# Patient Record
Sex: Female | Born: 1980 | Hispanic: Yes | Marital: Single | Smoking: Never smoker
Health system: Southern US, Community
[De-identification: ages and names within clinical notes are randomized; demographics above are authoritative.]

## PROBLEM LIST (undated history)

## (undated) DIAGNOSIS — N289 Disorder of kidney and ureter, unspecified: Secondary | ICD-10-CM

## (undated) DIAGNOSIS — N189 Chronic kidney disease, unspecified: Secondary | ICD-10-CM

## (undated) DIAGNOSIS — E669 Obesity, unspecified: Secondary | ICD-10-CM

## (undated) DIAGNOSIS — E785 Hyperlipidemia, unspecified: Secondary | ICD-10-CM

## (undated) HISTORY — PX: ABDOMINAL HYSTERECTOMY: SHX81

## (undated) HISTORY — DX: Disorder of kidney and ureter, unspecified: N28.9

## (undated) HISTORY — PX: TONSILLECTOMY: SUR1361

## (undated) HISTORY — DX: Chronic kidney disease, unspecified: N18.9

## (undated) HISTORY — DX: Hyperlipidemia, unspecified: E78.5

## (undated) HISTORY — PX: TUBAL LIGATION: SHX77

## (undated) HISTORY — PX: SPINE SURGERY: SHX786

## (undated) HISTORY — PX: APPENDECTOMY: SHX54

## (undated) HISTORY — DX: Obesity, unspecified: E66.9

---

## 2005-12-09 DIAGNOSIS — D689 Coagulation defect, unspecified: Secondary | ICD-10-CM | POA: Insufficient documentation

## 2005-12-09 DIAGNOSIS — R635 Abnormal weight gain: Secondary | ICD-10-CM | POA: Insufficient documentation

## 2012-02-01 HISTORY — PX: OTHER SURGICAL HISTORY: SHX169

## 2013-04-27 HISTORY — PX: OTHER SURGICAL HISTORY: SHX169

## 2015-01-02 HISTORY — PX: OTHER SURGICAL HISTORY: SHX169

## 2015-01-04 HISTORY — PX: OTHER SURGICAL HISTORY: SHX169

## 2016-05-04 ENCOUNTER — Encounter (HOSPITAL_COMMUNITY): Payer: Self-pay | Admitting: Emergency Medicine

## 2016-05-04 ENCOUNTER — Emergency Department (HOSPITAL_COMMUNITY)
Admission: EM | Admit: 2016-05-04 | Discharge: 2016-05-04 | Disposition: A | Discharge status: Home / Self Care | Payer: Medicaid Other | Attending: Emergency Medicine | Admitting: Emergency Medicine

## 2016-05-04 DIAGNOSIS — Z98818 Other dental procedure status: Secondary | ICD-10-CM | POA: Diagnosis not present

## 2016-05-04 DIAGNOSIS — R6884 Jaw pain: Secondary | ICD-10-CM | POA: Diagnosis not present

## 2016-05-04 MED ORDER — IBUPROFEN 800 MG PO TABS: 800.0000 mg | ORAL_TABLET | Freq: Three times a day (TID) | ORAL | 0 refills | Status: DC

## 2016-05-04 MED ORDER — CYCLOBENZAPRINE HCL 10 MG PO TABS: 10.0000 mg | ORAL_TABLET | Freq: Two times a day (BID) | ORAL | 0 refills | Status: DC | PRN

## 2016-05-04 NOTE — ED Provider Notes (Signed)
[facility] DEPT Provider Note   CSN: [id number] Arrival date & time: 05/04/16  3382  By signing my name below, I, Collene Leyden, attest that this documentation has been prepared under the direction and in the presence of Providence Lanius, [place] . Electronically Signed: Collene Leyden, Scribe. 05/04/16. 11:44 AM.  History   Chief Complaint Chief Complaint  Patient presents with  . Dental Pain   HPI Comments: Zoe Woodard is a 36 y.o. female with no apparent medical history, who presents to the Emergency Department with 1 week of constant dull pain that began after a dental procedure. Patient reports having 2 bilateral cavities filled one week ago and states since then she has had bilateral jaw pain. She states that pain radiates to bilateral ears and is worse with movement of her jaw. She has been able to open and close her mouth but reports some difficulty and associated pain with movement. She is taking Advil and aspirin orally for pain. She has not called her dentist yet and discussed the patient with him. Patient does state that her mouth was open for several hours during the cavity repairs. Patient denies any injury, fever, chills, facial swelling, sore throat, trouble swallowing, or any other symptoms.   The history is provided by the patient. No language interpreter was used.    History reviewed. No pertinent past medical history.  There are no active problems to display for this patient.   History reviewed. No pertinent surgical history.  OB History    No data available       Home Medications    Prior to Admission medications   Medication Sig Start Date End Date Taking? Authorizing Provider  cyclobenzaprine (FLEXERIL) 10 MG tablet Take 1 tablet (10 mg total) by mouth 2 (two) times daily as needed for muscle spasms. 05/04/16   Volanda Napoleon, PA-C  ibuprofen (ADVIL,MOTRIN) 800 MG tablet Take 1 tablet (800 mg total) by mouth 3 (three) times daily. 05/04/16   Volanda Napoleon, PA-C    Family History No family history on file.  Social History Social History  Substance Use Topics  . Smoking status: Not on file  . Smokeless tobacco: Not on file  . Alcohol use Not on file     Allergies   Patient has no known allergies.   Review of Systems Review of Systems  Constitutional: Negative for chills and fever.  HENT: Negative for ear pain, facial swelling, sore throat and trouble swallowing.        Jaw pain.   Respiratory: Negative for cough and shortness of breath.   Cardiovascular: Negative for chest pain.  Gastrointestinal: Negative for abdominal pain, diarrhea, nausea and vomiting.  Genitourinary: Negative for dysuria and hematuria.  Neurological: Negative for headaches.  All other systems reviewed and are negative.    Physical Exam Updated Vital Signs BP 132/76 (BP Location: Right Arm)   Pulse 77   Temp 98.8 F (37.1 C) (Oral)   Resp 16   Ht 5\' 3"  (1.6 m)   Wt 70.3 kg   SpO2 99%   BMI 27.46 kg/m   Physical Exam  Constitutional: She is oriented to person, place, and time. She appears well-developed and well-nourished.  HENT:  Head: Normocephalic and atraumatic.  Right Ear: Tympanic membrane normal.  Left Ear: Tympanic membrane normal.  Nose: Nose normal.  Mouth/Throat: Normal dentition.    No gingival swelling. No signs of dental abscess noted. Evidence of dental crowns noted on bilateral teeth. (see picture). Some  mild trismus (secondary to patients' report of pain) but good elevation and depression of mandible. No dislocation or deformity of jaw noted.   Cardiovascular: Normal rate.   Pulmonary/Chest: Effort normal.  Lymphadenopathy:    She has no cervical adenopathy.  Neurological: She is alert and oriented to person, place, and time.  Skin: Skin is warm and dry.  No erythema, warmth, or tenderness to the mandible or face.  Psychiatric: She has a normal mood and affect.  Nursing note and vitals reviewed.   ED  Treatments / Results  DIAGNOSTIC STUDIES: Oxygen Saturation is 100% on RA, normal by my interpretation.    COORDINATION OF CARE: 11:43 AM Discussed treatment plan with pt at bedside and pt agreed to plan, which includes a flexeril, pain medication, and warm compresses.   Labs (all labs ordered are listed, but only abnormal results are displayed) Labs Reviewed - No data to display  EKG  EKG Interpretation None       Radiology No results found.  Procedures Procedures (including critical care time)  Medications Ordered in ED Medications - No data to display   Initial Impression / Assessment and Plan / ED Course  I have reviewed the triage vital signs and the nursing notes.  Pertinent labs & imaging results that were available during my care of the patient were reviewed by me and considered in my medical decision making (see chart for details).     36 year old female who presents with 1 week of bilateral jaw pain that began after dental procedure. Patient states that she got 2 cavities filled at her dentist last week and the procedure took several hours to complete. She notes that she had her mouth open the entire time. No evidence of dental abscess or jaw dislocation on physical exam. No facial swelling or lymphadenopathy noted. Symptoms likely resolve muscular strain from prolonged procedure. Plan to treat with Flexeril and NSAIDs. Instructed her to use warm compresses. Advised patient to call her dentist to arrange for follow-up.  Instructed patient to follow-up with dentist in 24-48 hours. Return precautions discussed. Patient expresses understanding and agreement to plan.    Final Clinical Impressions(s) / ED Diagnoses   Final diagnoses:  Jaw pain    New Prescriptions Discharge Medication List as of 05/04/2016 12:04 PM    START taking these medications   Details  cyclobenzaprine (FLEXERIL) 10 MG tablet Take 1 tablet (10 mg total) by mouth 2 (two) times daily as needed  for muscle spasms., Starting Tue 05/04/2016, Print    ibuprofen (ADVIL,MOTRIN) 800 MG tablet Take 1 tablet (800 mg total) by mouth 3 (three) times daily., Starting Tue 05/04/2016, Print       I personally performed the services described in this documentation, which was scribed in my presence. The recorded information has been reviewed and is accurate.    Volanda Napoleon, PA-C 05/04/16 [place], MD 05/05/16 [phone]

## 2016-05-04 NOTE — ED Notes (Signed)
Pt states that she had 2 cavities filled last wed , one on each side of lower jaw , since then then she has had increasing pain, that radiates to her ears, feels like her face is swollen, states she did not want to go back to ger dentist  Because she felt he hurt her and did not do  Something right. She denies drinking from straw and /or smoking.

## 2016-05-04 NOTE — Discharge Instructions (Addendum)
Call your dentist today and arrange to be seen in the next 24-48 hours for re-evaluation. Tell him you were seen in the Emergency Department.   You can do the jaw exercises listed on the discharge instructions.   Apply warm compresses to the area.   Return to the Emergency Department for any worsening pain, fever, or other worrisome or concerning symptoms.

## 2016-05-04 NOTE — ED Triage Notes (Addendum)
Pt reports she has two cavities filled last Wednesday and since the numbing wore off pt has been experiencing bad in her jaws and into ear. Pt states she has not called dentist "because I didn't want them to do that to my mouth again". Pt has no difficulty swallowing or pain in throat.

## 2016-08-02 ENCOUNTER — Ambulatory Visit (INDEPENDENT_AMBULATORY_CARE_PROVIDER_SITE_OTHER): Payer: Medicaid Other | Admitting: Internal Medicine

## 2016-08-02 VITALS — BP 114/82 | HR 85 | Temp 99.0°F | Ht 63.0 in | Wt 155.6 lb

## 2016-08-02 DIAGNOSIS — Z7689 Persons encountering health services in other specified circumstances: Secondary | ICD-10-CM

## 2016-08-02 DIAGNOSIS — Z0000 Encounter for general adult medical examination without abnormal findings: Secondary | ICD-10-CM | POA: Diagnosis not present

## 2016-08-02 NOTE — Patient Instructions (Signed)
Ms. Tat,  It was nice to meet you today. Thank you for providing your health information.  I will see you back in 1 year or sooner for any concerns.  Best, Dr. Ola Spurr

## 2016-08-02 NOTE — Progress Notes (Signed)
[facility] Progress Note  Subjective:  Zoe Woodard is a 36 y.o. female who presents to establish care.  She has no specific concerns today. Had 2 cavities filled recently and still getting some food stuck.   PMH: - Fibroids - Not on any regular medications but takes a multivitamin and vitamin C supplement  Last Family Doctor in [facility] [place].  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 - Children are healthy.  Social: - Currently unemployed - Moved from [facility] to live with family in [place] due to financial constraints - Used to work in a distribution center - First language is Spanish but speaks English - Has 3 children --- Elmer (67), Jennifer (110), Emely (5) - Friend drove her to this appointment. - She is [place]. - Does zumba 3 days a week for exercise - Never smoked, does not use recreational drugs, no alcohol use - Likes to color for fun  No Known Allergies  Objective: Blood pressure 114/82, pulse 85, temperature 99 F (37.2 C), temperature source Oral, height 5\' 3"  (1.6 m), weight 155 lb 9.6 oz (70.6 kg), SpO2 97 %. Body mass index is 27.56 kg/m.  Constitutional: Overweight female in NAD HENT: MMM, normal posterior oropharynx Cardiovascular: RRR, S1, S2, no m/r/g.  Pulmonary/Chest: Effort normal and breath sounds normal. No respiratory distress.  Abdominal: Soft. +BS, NT, ND Musculoskeletal: No LE edema.  Neurological: AOx3, no focal deficits. Skin: Skin is warm and dry. No rash noted.  Psychiatric: Normal  mood and affect.  Vitals reviewed  Assessment/Plan: Healthcare maintenance - Healthy patient with no chronic medical problems, though has extensive surgical history. - Will request records from last PCP to determine if patient is due for any vaccinations/screening labs. - Counseled on healthy weight and encouraged continuing regular exercise and to look at meal portions.   Follow-up prn.  Olene Floss, MD [place], PGY-3

## 2016-08-04 ENCOUNTER — Encounter: Payer: Self-pay | Admitting: Internal Medicine

## 2016-08-04 DIAGNOSIS — Z0000 Encounter for general adult medical examination without abnormal findings: Secondary | ICD-10-CM | POA: Insufficient documentation

## 2016-08-04 NOTE — Assessment & Plan Note (Signed)
-   Healthy patient with no chronic medical problems, though has extensive surgical history. - Will request records from last PCP to determine if patient is due for any vaccinations/screening labs. - Counseled on healthy weight and encouraged continuing regular exercise and to look at meal portions.

## 2017-06-11 ENCOUNTER — Encounter

## 2017-10-06 ENCOUNTER — Ambulatory Visit (INDEPENDENT_AMBULATORY_CARE_PROVIDER_SITE_OTHER): Payer: Medicaid Other

## 2017-10-06 DIAGNOSIS — Z23 Encounter for immunization: Secondary | ICD-10-CM | POA: Diagnosis not present

## 2017-10-06 NOTE — Progress Notes (Signed)
Pt presents in nurse clinic for a flu vaccine. Injection give LD, site unremarkable. Epic and [facility] updated.

## 2018-01-18 ENCOUNTER — Encounter (HOSPITAL_COMMUNITY): Payer: Self-pay

## 2018-01-18 ENCOUNTER — Ambulatory Visit (HOSPITAL_COMMUNITY)
Admission: EM | Admit: 2018-01-18 | Discharge: 2018-01-18 | Disposition: A | Discharge status: Home / Self Care | Payer: Medicaid Other | Attending: Family Medicine | Admitting: Family Medicine

## 2018-01-18 DIAGNOSIS — R6889 Other general symptoms and signs: Secondary | ICD-10-CM | POA: Diagnosis not present

## 2018-01-18 MED ORDER — IBUPROFEN 400 MG PO TABS: 400.0000 mg | ORAL_TABLET | Freq: Four times a day (QID) | ORAL | 0 refills | Status: DC | PRN

## 2018-01-18 MED ORDER — BENZONATATE 100 MG PO CAPS: 100.0000 mg | ORAL_CAPSULE | Freq: Three times a day (TID) | ORAL | 0 refills | Status: DC

## 2018-01-18 MED ORDER — FLUTICASONE PROPIONATE 50 MCG/ACT NA SUSP: 2.0000 | Freq: Every day | NASAL | 0 refills | Status: DC

## 2018-01-18 MED ORDER — CETIRIZINE HCL 10 MG PO CHEW: 10.0000 mg | CHEWABLE_TABLET | Freq: Every day | ORAL | 0 refills | Status: DC

## 2018-01-18 NOTE — Discharge Instructions (Signed)
Get plenty of rest and push fluids Tessalon Perles prescribed for cough Zyrtec prescribed for nasal congestion, runny nose, and/or sore throat Flonase prescribed for nasal congestion and runny nose Use medications daily for symptom relief Ibuprofen prescribed.  Take as needed for fever and/or body aches and pain Follow up with PCP next week if symptoms persist Return or go to ER if you have any new or worsening symptoms fever, chills, nausea, vomiting, chest pain, cough, shortness of breath, wheezing, abdominal pain, changes in bowel or bladder habits, etc..Marland Kitchen

## 2018-01-18 NOTE — ED Provider Notes (Signed)
[facility]   [id number] 01/18/18 Arrival Time: 1761   CC: Flu-like illness  SUBJECTIVE: History from: patient.  Tianna Baus is a 38 y.o. female who presents with abrupt onset of nasal congestion, runny nose, sore throat, cough, body aches, and fatigue that began 3 days ago .  Admits to positive sick exposure.  Has tried OTC medications with temporary relief.  Denies aggravating factors.  Denies previous symptoms in the past.  Complains of subjective fever,   Denies SOB, wheezing, chest pain, nausea, changes in bowel or bladder habits.    Received flu shot this year: yes.  ROS: As per HPI.  History reviewed. No pertinent past medical history. Past Surgical History:  Procedure Laterality Date  . ABDOMINAL HYSTERECTOMY    . SPINE SURGERY    . TUBAL LIGATION     No Known Allergies No current facility-administered medications on file prior to encounter.    No current outpatient medications on file prior to encounter.   Social History   Socioeconomic History  . Marital status: Single    Spouse name: Not on file  . Number of children: Not on file  . Years of education: Not on file  . Highest education level: Not on file  Occupational History  . Not on file  Social Needs  . Financial resource strain: Not on file  . Food insecurity:    Worry: Not on file    Inability: Not on file  . Transportation needs:    Medical: Not on file    Non-medical: Not on file  Tobacco Use  . Smoking status: Never Smoker  . Smokeless tobacco: Never Used  Substance and Sexual Activity  . Alcohol use: No  . Drug use: No  . Sexual activity: Not on file  Lifestyle  . Physical activity:    Days per week: Not on file    Minutes per session: Not on file  . Stress: Not on file  Relationships  . Social connections:    Talks on phone: Not on file    Gets together: Not on file    Attends religious service: Not on file    Active member of club or organization: Not on file   Attends meetings of clubs or organizations: Not on file    Relationship status: Not on file  . Intimate partner violence:    Fear of current or ex partner: Not on file    Emotionally abused: Not on file    Physically abused: Not on file    Forced sexual activity: Not on file  Other Topics Concern  . Not on file  Social History Narrative  . Not on file   Family History  Problem Relation Age of Onset  . Healthy Mother   . Healthy Father     OBJECTIVE:  Vitals:   01/18/18 1013 01/18/18 1207  BP: (!) 154/108 (!) 157/106  Pulse: (!) 104   Resp: 19   Temp: 98.1 F (36.7 C)   SpO2: 100%      General appearance: alert; appears fatigued, but nontoxic; speaking in full sentences and tolerating own secretions HEENT: NCAT; Ears: EACs clear, TMs pearly gray; Eyes: PERRL.  EOM grossly intact. Sinuses: nontender; Nose: nares patent with mild rhinorrhea, Throat: oropharynx clear, tonsils non erythematous or enlarged, uvula midline; dry MM Neck: supple without LAD Lungs: unlabored respirations, symmetrical air entry; cough: absent; no respiratory distress; CTAB Heart: regular rate and rhythm.  Radial pulses 2+ symmetrical bilaterally; cap refill <2 secs  Skin: warm and dry; no tenting of dorsal aspect of hand Psychological: alert and cooperative; normal mood and affect  ASSESSMENT & PLAN:  1. Flu-like symptoms     Meds ordered this encounter  Medications  . ibuprofen (ADVIL,MOTRIN) 400 MG tablet    Sig: Take 1 tablet (400 mg total) by mouth every 6 (six) hours as needed.    Dispense:  30 tablet    Refill:  0    Order Specific Question:   Supervising Provider    Answer:   Raylene Everts [[id number]]  . fluticasone (FLONASE) 50 MCG/ACT nasal spray    Sig: Place 2 sprays into both nostrils daily.    Dispense:  16 g    Refill:  0    Order Specific Question:   Supervising Provider    Answer:   Raylene Everts [[id number]]  . cetirizine (ZYRTEC) 10 MG chewable tablet    Sig: Chew 1  tablet (10 mg total) by mouth daily.    Dispense:  20 tablet    Refill:  0    Order Specific Question:   Supervising Provider    Answer:   Raylene Everts [[id number]]  . benzonatate (TESSALON) 100 MG capsule    Sig: Take 1 capsule (100 mg total) by mouth every 8 (eight) hours.    Dispense:  21 capsule    Refill:  0    Order Specific Question:   Supervising Provider    Answer:   Raylene Everts [[id number]]    Get plenty of rest and push fluids Tessalon Perles prescribed for cough Zyrtec prescribed for nasal congestion, runny nose, and/or sore throat Flonase prescribed for nasal congestion and runny nose Use medications daily for symptom relief Ibuprofen prescribed.  Take as needed for fever and/or body aches and pain Follow up with PCP next week if symptoms persist Return or go to ER if you have any new or worsening symptoms fever, chills, nausea, vomiting, chest pain, cough, shortness of breath, wheezing, abdominal pain, changes in bowel or bladder habits, etc...  Blood pressure elevated in office.  Please recheck in 24 hours.  If it continues to be greater than 140/90 please follow up with PCP for further evaluation and management.    Reviewed expectations re: course of current medical issues. Questions answered. Outlined signs and symptoms indicating need for more acute intervention. Patient verbalized understanding. After Visit Summary given.         Lestine Box, PA-C 01/18/18 1226

## 2018-01-18 NOTE — ED Triage Notes (Signed)
Pt presents with complaints of fever, sore throat, stuff nose and headache x 3-4 days.

## 2018-01-19 DIAGNOSIS — B349 Viral infection, unspecified: Secondary | ICD-10-CM | POA: Diagnosis not present

## 2018-01-19 DIAGNOSIS — D72829 Elevated white blood cell count, unspecified: Secondary | ICD-10-CM | POA: Diagnosis not present

## 2018-01-19 DIAGNOSIS — R1084 Generalized abdominal pain: Secondary | ICD-10-CM | POA: Diagnosis not present

## 2018-01-19 DIAGNOSIS — R319 Hematuria, unspecified: Secondary | ICD-10-CM | POA: Diagnosis not present

## 2018-01-19 DIAGNOSIS — R509 Fever, unspecified: Secondary | ICD-10-CM | POA: Diagnosis not present

## 2018-01-19 DIAGNOSIS — R109 Unspecified abdominal pain: Secondary | ICD-10-CM | POA: Diagnosis not present

## 2018-01-19 DIAGNOSIS — K5730 Diverticulosis of large intestine without perforation or abscess without bleeding: Secondary | ICD-10-CM | POA: Diagnosis not present

## 2018-01-27 ENCOUNTER — Encounter: Payer: Self-pay | Admitting: Family Medicine

## 2018-01-27 ENCOUNTER — Other Ambulatory Visit: Payer: Self-pay

## 2018-01-27 ENCOUNTER — Ambulatory Visit: Payer: Medicaid Other | Admitting: Family Medicine

## 2018-01-27 VITALS — BP 140/92 | HR 94 | Temp 98.8°F | Ht 63.0 in | Wt 169.6 lb

## 2018-01-27 DIAGNOSIS — R030 Elevated blood-pressure reading, without diagnosis of hypertension: Secondary | ICD-10-CM

## 2018-01-27 DIAGNOSIS — E6609 Other obesity due to excess calories: Secondary | ICD-10-CM | POA: Diagnosis not present

## 2018-01-27 DIAGNOSIS — E669 Obesity, unspecified: Secondary | ICD-10-CM | POA: Insufficient documentation

## 2018-01-27 DIAGNOSIS — Z6830 Body mass index (BMI) 30.0-30.9, adult: Secondary | ICD-10-CM | POA: Diagnosis not present

## 2018-01-27 DIAGNOSIS — Z0000 Encounter for general adult medical examination without abnormal findings: Secondary | ICD-10-CM

## 2018-01-27 NOTE — Patient Instructions (Addendum)
It was wonderful to see you today.  Thank you for choosing [facility].   Please call [phone] with any questions about today's appointment.  Please be sure to schedule follow up at the front  desk before you leave today.   Dorris Singh, MD  Family Medicine    Monitor your blood pressure at home----twice a week  Reduce juice, soda, and very salty foods  Increase your activity level         Mediterranean Diet  Why follow it? Research shows. . Those who follow the Mediterranean diet have a reduced risk of heart disease  . The diet is associated with a reduced incidence of Parkinson's and Alzheimer's diseases . People following the diet may have longer life expectancies and lower rates of chronic diseases  . The Dietary Guidelines for Americans recommends the Mediterranean diet as an eating plan to promote health and prevent disease  What Is the Mediterranean Diet?  . Healthy eating plan based on typical foods and recipes of Mediterranean-style cooking . The diet is primarily a plant based diet; these foods should make up a majority of meals   Starches - Plant based foods should make up a majority of meals - They are an important sources of vitamins, minerals, energy, antioxidants, and fiber - Choose whole grains, foods high in fiber and minimally processed items  - Typical grain sources include wheat, oats, barley, corn, [place] rice, bulgar, farro, millet, polenta, couscous  - Various types of beans include chickpeas, lentils, fava beans, black beans, white beans   Fruits  Veggies - Large quantities of antioxidant rich fruits & veggies; 6 or more servings  - Vegetables can be eaten raw or lightly drizzled with oil and cooked  - Vegetables common to the traditional Mediterranean Diet include: artichokes, arugula, beets, broccoli, brussel sprouts, cabbage, carrots, celery, collard greens, cucumbers, eggplant, kale, leeks, lemons, lettuce, mushrooms, okra, onions,  peas, peppers, potatoes, pumpkin, radishes, rutabaga, shallots, spinach, sweet potatoes, turnips, zucchini - Fruits common to the Mediterranean Diet include: apples, apricots, avocados, cherries, clementines, dates, figs, grapefruits, grapes, melons, nectarines, oranges, peaches, pears, pomegranates, strawberries, tangerines  Fats - Replace butter and margarine with healthy oils, such as olive oil, canola oil, and tahini  - Limit nuts to no more than a handful a day  - Nuts include walnuts, almonds, pecans, pistachios, pine nuts  - Limit or avoid candied, honey roasted or heavily salted nuts - Olives are central to the [employer] - can be eaten whole or used in a variety of dishes   Meats Protein - Limiting red meat: no more than a few times a month - When eating red meat: choose lean cuts and keep the portion to the size of deck of cards - Eggs: approx. 0 to 4 times a week  - Fish and lean poultry: at least 2 a week  - Healthy protein sources include, chicken, [place], lean beef, lamb - Increase intake of seafood such as tuna, salmon, trout, mackerel, shrimp, scallops - Avoid or limit high fat processed meats such as sausage and bacon  Dairy - Include moderate amounts of low fat dairy products  - Focus on healthy dairy such as fat free yogurt, skim milk, low or reduced fat cheese - Limit dairy products higher in fat such as whole or 2% milk, cheese, ice cream  Alcohol - Moderate amounts of red wine is ok  - No more than 5 oz daily for women (all ages) and men  older than age 34  - No more than 10 oz of wine daily for men younger than 27  Other - Limit sweets and other desserts  - Use herbs and spices instead of salt to flavor foods  - Herbs and spices common to the traditional Mediterranean Diet include: basil, bay leaves, chives, cloves, cumin, fennel, garlic, lavender, marjoram, mint, oregano, parsley, pepper, rosemary, sage, savory, sumac, tarragon, thyme   It's not just a diet,  it's a lifestyle:  . The Mediterranean diet includes lifestyle factors typical of those in the region  . Foods, drinks and meals are best eaten with others and savored . Daily physical activity is important for overall good health . This could be strenuous exercise like running and aerobics . This could also be more leisurely activities such as walking, housework, yard-work, or taking the stairs . Moderation is the key; a balanced and healthy diet accommodates most foods and drinks . Consider portion sizes and frequency of consumption of certain foods   Meal Ideas & Options:  . Breakfast:  o Whole wheat toast or whole wheat English muffins with peanut butter & hard boiled egg o Steel cut oats topped with apples & cinnamon and skim milk  o Fresh fruit: banana, strawberries, melon, berries, peaches  o Smoothies: strawberries, bananas, greek yogurt, peanut butter o Low fat greek yogurt with blueberries and granola  o Egg white omelet with spinach and mushrooms o Breakfast couscous: whole wheat couscous, apricots, skim milk, cranberries  . Sandwiches:  o Hummus and grilled vegetables (peppers, zucchini, squash) on whole wheat bread   o Grilled chicken on whole wheat pita with lettuce, tomatoes, cucumbers or tzatziki  o Tuna salad on whole wheat bread: tuna salad made with greek yogurt, olives, red peppers, capers, green onions o Garlic rosemary lamb pita: lamb sauted with garlic, rosemary, salt & pepper; add lettuce, cucumber, greek yogurt to pita - flavor with lemon juice and black pepper  . Seafood:  o Mediterranean grilled salmon, seasoned with garlic, basil, parsley, lemon juice and black pepper o Shrimp, lemon, and spinach whole-grain pasta salad made with low fat greek yogurt  o Seared scallops with lemon orzo  o Seared tuna steaks seasoned salt, pepper, coriander topped with tomato mixture of olives, tomatoes, olive oil, minced garlic, parsley, green onions and cappers  . Meats:   o Herbed greek chicken salad with kalamata olives, cucumber, feta  o Red bell peppers stuffed with spinach, bulgur, lean ground beef (or lentils) & topped with feta   o Kebabs: skewers of chicken, tomatoes, onions, zucchini, squash  o [place] burgers: made with red onions, mint, dill, lemon juice, feta cheese topped with roasted red peppers . Vegetarian o Cucumber salad: cucumbers, artichoke hearts, celery, red onion, feta cheese, tossed in olive oil & lemon juice  o Hummus and whole grain pita points with a greek salad (lettuce, tomato, feta, olives, cucumbers, red onion) o Lentil soup with celery, carrots made with vegetable broth, garlic, salt and pepper  o Tabouli salad: parsley, bulgur, mint, scallions, cucumbers, tomato, radishes, lemon juice, olive oil, salt and pepper.

## 2018-01-27 NOTE — Progress Notes (Signed)
  Patient Name: Zoe Woodard Date of Birth: [birth year] Date of Visit: 01/27/18 PCP: Guadalupe Dawn, MD  Chief Complaint: elevated blood pressure   Subjective: Zoe Woodard is a pleasant 38 y.o. year old with history significant for obesity presenting today for routine follow-up and elevated blood pressure.  Patient was recently seen in the ED for viral symptoms.  The patient reports that overall her viral symptoms have improved except for cough.  She reports she spent quite a bit of money on the [employer] and is disappointed as they have not improved her cough.  The patient had elevated blood pressure in the emergency department.  She denies headaches, vision changes, chest pain, difficulty breathing, extremity edema.  She has no family history of hypertension.  There is a family history of diabetes.  She reports weight gain that is undesired.  She reports eating more poorly and not exercising.  She thinks this is contributing to her elevated blood pressure  ROS:  ROS As above.   I have reviewed the patient's medical, surgical, family, and social history as appropriate.   Vitals:   01/27/18 1024  BP: (!) 140/92  Pulse: 94  Temp: 98.8 F (37.1 C)  SpO2: 99%   Filed Weights   01/27/18 1024  Weight: 169 lb 9.6 oz (76.9 kg)  HEENT: Sclera anicteric. Dentition is moderate. Appears well hydrated. Neck: Supple Cardiac: Regular rate and rhythm. Normal S1/S2. No murmurs, rubs, or gallops appreciated. Lungs: Clear bilaterally to ascultation.  Extremities: Warm, well perfused without edema.  Skin: Warm, dry Psych: Pleasant and appropriate   Zoe Woodard was seen today for hospitalization follow-up.  Diagnoses and all orders for this visit:  Annual physical exam We discussed healthy eating habits, moderate physical activity for 30 minutes 5 times per week (or 150 minutes), safe sex practices, avoiding tobacco products, safe alcohol consumption, and safe driving habits. I inquired as  to stressors in her life---she reports she has had many and did not elaborate.  -     Lipid Panel -     Hemoglobin A1c  Elevated blood pressure reading, patient to monitor at home. Discussed options including treatment given goal <140/90. Patient would like to make dietary/lifestyle changes.  -     Basic Metabolic Panel  Dorris Singh, MD  Family Medicine Teaching Service

## 2018-01-28 LAB — BASIC METABOLIC PANEL
BUN / CREAT RATIO: 8 — AB (ref 9–23)
BUN: 12 mg/dL (ref 6–20)
CO2: 22 mmol/L (ref 20–29)
CREATININE: 1.42 mg/dL — AB (ref 0.57–1.00)
Calcium: 9 mg/dL (ref 8.7–10.2)
Chloride: 101 mmol/L (ref 96–106)
GFR calc non Af Amer: 47 mL/min/{1.73_m2} — ABNORMAL LOW (ref >59)
GFR, EST AFRICAN AMERICAN: 54 mL/min/{1.73_m2} — AB (ref >59)
Glucose: 78 mg/dL (ref 65–99)
Potassium: 5.3 mmol/L — ABNORMAL HIGH (ref 3.5–5.2)
SODIUM: 142 mmol/L (ref 134–144)

## 2018-01-28 LAB — LIPID PANEL
Chol/HDL Ratio: 6 ratio — ABNORMAL HIGH (ref 0.0–4.4)
Cholesterol, Total: 211 mg/dL — ABNORMAL HIGH (ref 100–199)
HDL: 35 mg/dL — ABNORMAL LOW (ref >39)
Triglycerides: 491 mg/dL — ABNORMAL HIGH (ref 0–149)

## 2018-01-28 LAB — HEMOGLOBIN A1C
ESTIMATED AVERAGE GLUCOSE: 108 mg/dL
Hgb A1c MFr Bld: 5.4 % (ref 4.8–5.6)

## 2018-01-30 ENCOUNTER — Telehealth: Payer: Self-pay | Admitting: Family Medicine

## 2018-01-30 DIAGNOSIS — E875 Hyperkalemia: Secondary | ICD-10-CM

## 2018-01-30 DIAGNOSIS — E781 Pure hyperglyceridemia: Secondary | ICD-10-CM

## 2018-01-30 NOTE — Telephone Encounter (Signed)
Have tried calling patient on several different occassions using [place] [id number]. Interpreter states that the number is not valid.  Attempted to call without interpreter to see what the recording was. It states that the call can not be completed as dialed.  Please advise.  Ozella Almond, [place]

## 2018-01-30 NOTE — Telephone Encounter (Signed)
Attempted to call patient over weekend and this AM.   Red team: Please call patient and let her know that her kidney function is a bit elevated.  I would like to repeat her blood work when she has not been eating or drinking anything but water for 6 hours.  The best time to do this is in the morning.  I recommend getting the blood work this week.  Again the blood work should be fasting.  Please let me know if she has questions or concerns.  The blood work has been ordered

## 2018-02-01 ENCOUNTER — Telehealth: Payer: Self-pay

## 2018-02-01 NOTE — Telephone Encounter (Signed)
Attempted to call patient again for follow up appointment.  There still was no answer and no voice mail.  Recording repeats that call can not be completed as dialed / at this time.  Ozella Almond, [place]

## 2018-02-07 ENCOUNTER — Encounter: Payer: Self-pay | Admitting: Family Medicine

## 2018-02-07 NOTE — Progress Notes (Signed)
Sent letter as unable to reach patient asking her to call to schedule appointment as soon as possible.  Dorris Singh, MD  Family Medicine Teaching Service

## 2018-10-06 ENCOUNTER — Ambulatory Visit (INDEPENDENT_AMBULATORY_CARE_PROVIDER_SITE_OTHER): Payer: Medicaid Other

## 2018-10-06 ENCOUNTER — Other Ambulatory Visit: Payer: Self-pay

## 2018-10-06 DIAGNOSIS — Z23 Encounter for immunization: Secondary | ICD-10-CM | POA: Diagnosis not present

## 2018-10-06 NOTE — Progress Notes (Signed)
Patient presents in nurse clinic for Flu vaccine. Vaccine given, LD. Patient tolerated well. 

## 2019-03-18 ENCOUNTER — Emergency Department (HOSPITAL_COMMUNITY)
Admission: EM | Admit: 2019-03-18 | Discharge: 2019-03-18 | Disposition: A | Discharge status: Home / Self Care | Payer: Medicaid Other | Attending: Emergency Medicine | Admitting: Emergency Medicine

## 2019-03-18 ENCOUNTER — Other Ambulatory Visit: Payer: Self-pay

## 2019-03-18 ENCOUNTER — Encounter (HOSPITAL_COMMUNITY): Payer: Self-pay | Admitting: *Deleted

## 2019-03-18 DIAGNOSIS — Y9384 Activity, sleeping: Secondary | ICD-10-CM | POA: Diagnosis not present

## 2019-03-18 DIAGNOSIS — Z79899 Other long term (current) drug therapy: Secondary | ICD-10-CM | POA: Insufficient documentation

## 2019-03-18 DIAGNOSIS — Y92009 Unspecified place in unspecified non-institutional (private) residence as the place of occurrence of the external cause: Secondary | ICD-10-CM | POA: Insufficient documentation

## 2019-03-18 DIAGNOSIS — W57XXXA Bitten or stung by nonvenomous insect and other nonvenomous arthropods, initial encounter: Secondary | ICD-10-CM | POA: Insufficient documentation

## 2019-03-18 DIAGNOSIS — Y999 Unspecified external cause status: Secondary | ICD-10-CM | POA: Diagnosis not present

## 2019-03-18 DIAGNOSIS — S50861A Insect bite (nonvenomous) of right forearm, initial encounter: Secondary | ICD-10-CM | POA: Insufficient documentation

## 2019-03-18 MED ORDER — FAMOTIDINE 20 MG PO TABS
20.0000 mg | ORAL_TABLET | Freq: Once | ORAL | Status: AC
  Administered 2019-03-18: 20 mg via ORAL
  Filled 2019-03-18: qty 1

## 2019-03-18 MED ORDER — DIPHENHYDRAMINE HCL 25 MG PO CAPS
25.0000 mg | ORAL_CAPSULE | Freq: Once | ORAL | Status: AC
  Administered 2019-03-18: 25 mg via ORAL
  Filled 2019-03-18: qty 1

## 2019-03-18 NOTE — ED Notes (Signed)
bp still high cautioned her to follow up with her doctor

## 2019-03-18 NOTE — ED Triage Notes (Signed)
The  Pt has a red circular area rt forearm  Since last pm  She thinks it was an insect bite  No pain in particujlar just sensitive    lmp none

## 2019-03-18 NOTE — ED Provider Notes (Signed)
[facility] EMERGENCY DEPARTMENT Provider Note   CSN: [id number] Arrival date & time: 03/18/19  2022     History Chief Complaint  Patient presents with  . Arm Pain    Zoe Woodard is a 39 y.o. female.  Zoe Woodard is a 40 y.o. female who is otherwise healthy, presents to the emergency department for evaluation of a circular area of redness on the right forearm.  She reports she noticed this when she woke up this morning.  She reports it was initially smaller but very itchy.  She states the area is not very painful but has intermittently been very sensitive to touch.  It is continued to itch throughout the day and she thinks she likely got an insect bite, she denies any other similar patches elsewhere on her body.  She has not noted any swelling, blistering or drainage from the area.  No facial swelling, shortness of breath, difficulty breathing or wheezing.  She took 1 dose of Benadryl without much improvement, has not tried anything else to treat symptoms.  No other aggravating or alleviating factors.        Past Medical History:  Diagnosis Date  . Obesity     Patient Active Problem List   Diagnosis Date Noted  . Obesity 01/27/2018    Past Surgical History:  Procedure Laterality Date  . ABDOMINAL HYSTERECTOMY    . SPINE SURGERY    . TUBAL LIGATION       OB History   No obstetric history on file.     Family History  Problem Relation Age of Onset  . Diabetes Mother   . Healthy Father     Social History   Tobacco Use  . Smoking status: Never Smoker  . Smokeless tobacco: Never Used  Substance Use Topics  . Alcohol use: No  . Drug use: No    Home Medications Prior to Admission medications   Medication Sig Start Date End Date Taking? Authorizing Provider  benzonatate (TESSALON) 100 MG capsule Take 1 capsule (100 mg total) by mouth every 8 (eight) hours. 01/18/18   Wurst, [place], PA-C  cetirizine (ZYRTEC) 10 MG chewable tablet Chew 1  tablet (10 mg total) by mouth daily. 01/18/18   Wurst, [place], PA-C  fluticasone (FLONASE) 50 MCG/ACT nasal spray Place 2 sprays into both nostrils daily. 01/18/18   Wurst, [place], PA-C  ibuprofen (ADVIL,MOTRIN) 400 MG tablet Take 1 tablet (400 mg total) by mouth every 6 (six) hours as needed. 01/18/18   Wurst, [place], PA-C    Allergies    Patient has no known allergies.  Review of Systems   Review of Systems  Constitutional: Negative for chills and fever.  HENT: Negative for facial swelling.   Respiratory: Negative for shortness of breath.   Skin: Positive for color change.    Physical Exam Updated Vital Signs BP (!) 158/114 (BP Location: Right Arm)   Pulse 91   Temp 99.1 F (37.3 C) (Oral)   Resp 18   Ht 5\' 3"  (1.6 m)   Wt 76.9 kg   SpO2 98%   BMI 30.03 kg/m   Physical Exam Vitals and nursing note reviewed.  Constitutional:      General: She is not in acute distress.    Appearance: Normal appearance. She is well-developed. She is not diaphoretic.  HENT:     Head: Normocephalic and atraumatic.  Eyes:     General:        Right eye: No discharge.  Left eye: No discharge.  Pulmonary:     Effort: Pulmonary effort is normal. No respiratory distress.  Skin:    General: Skin is warm and dry.     Findings: Erythema present.     Comments: Circular area of erythema on the inside of the right forearm measuring approximately 5 x 5 cm, no vesicles, pustules or petechiae noted, there is no induration or fluctuance  Neurological:     Mental Status: She is alert.     Coordination: Coordination normal.  Psychiatric:        Mood and Affect: Mood normal.        Behavior: Behavior normal.     ED Results / Procedures / Treatments   Labs (all labs ordered are listed, but only abnormal results are displayed) Labs Reviewed - No data to display  EKG None  Radiology No results found.  Procedures Procedures (including critical care time)  Medications Ordered in  ED Medications  famotidine (PEPCID) tablet 20 mg (20 mg Oral Given 03/18/19 2159)  diphenhydrAMINE (BENADRYL) capsule 25 mg (25 mg Oral Given 03/18/19 2159)    ED Course  I have reviewed the triage vital signs and the nursing notes.  Pertinent labs & imaging results that were available during my care of the patient were reviewed by me and considered in my medical decision making (see chart for details).    MDM Rules/Calculators/A&P                     39 year old female presents with likely insect bite to the right inner forearm it is pruritic with round erythematous area.  It does not appear cellulitic it is not indurated or fluctuant, there is no area of drainage, appears more consistent with localized allergic reaction.  No signs of more systemic reaction such as facial swelling, shortness of breath.  Treated in the ED with Pepcid and Benadryl and cool compress with improvement.  Will have patient continue treating with Pepcid and Benadryl at home.  Return precautions discussed.  Patient expresses understanding and agreement with plan.  Discharged home in good condition.  Final Clinical Impression(s) / ED Diagnoses Final diagnoses:  Insect bite of right forearm, initial encounter    Rx / DC Orders ED Discharge Orders    None       Janet Berlin 03/18/19 2343    Elnora Morrison, MD 03/19/19 0005

## 2019-03-18 NOTE — Discharge Instructions (Signed)
Please continue treating insect bite with Benadryl which can be taken 25 mg every 6 hours, as well as Pepcid (famotidine) 20 mg, taken twice daily.  You can also apply cool compresses.  If area is worsening you notice worsening redness, blistering or purulent drainage please return to the emergency department otherwise please follow-up with your primary care doctor.

## 2019-07-21 DIAGNOSIS — Z20822 Contact with and (suspected) exposure to covid-19: Secondary | ICD-10-CM | POA: Diagnosis not present

## 2019-07-21 DIAGNOSIS — R519 Headache, unspecified: Secondary | ICD-10-CM | POA: Diagnosis not present

## 2019-07-21 DIAGNOSIS — I160 Hypertensive urgency: Secondary | ICD-10-CM | POA: Diagnosis not present

## 2019-07-21 DIAGNOSIS — I10 Essential (primary) hypertension: Secondary | ICD-10-CM | POA: Diagnosis not present

## 2019-07-22 DIAGNOSIS — I160 Hypertensive urgency: Secondary | ICD-10-CM

## 2019-07-22 DIAGNOSIS — I10 Essential (primary) hypertension: Secondary | ICD-10-CM | POA: Diagnosis not present

## 2019-07-22 DIAGNOSIS — N1832 Chronic kidney disease, stage 3b: Secondary | ICD-10-CM | POA: Diagnosis not present

## 2019-07-22 DIAGNOSIS — N179 Acute kidney failure, unspecified: Secondary | ICD-10-CM | POA: Diagnosis not present

## 2019-07-22 DIAGNOSIS — I358 Other nonrheumatic aortic valve disorders: Secondary | ICD-10-CM | POA: Diagnosis not present

## 2019-07-22 DIAGNOSIS — I517 Cardiomegaly: Secondary | ICD-10-CM | POA: Diagnosis not present

## 2019-07-22 DIAGNOSIS — I313 Pericardial effusion (noninflammatory): Secondary | ICD-10-CM | POA: Diagnosis not present

## 2019-07-22 HISTORY — DX: Hypertensive urgency: I16.0

## 2019-08-03 ENCOUNTER — Ambulatory Visit (INDEPENDENT_AMBULATORY_CARE_PROVIDER_SITE_OTHER): Payer: Medicaid Other | Admitting: Family Medicine

## 2019-08-03 VITALS — HR 90 | Temp 98.7°F | Ht 63.0 in

## 2019-08-03 DIAGNOSIS — R05 Cough: Secondary | ICD-10-CM | POA: Diagnosis present

## 2019-08-03 DIAGNOSIS — N179 Acute kidney failure, unspecified: Secondary | ICD-10-CM

## 2019-08-03 DIAGNOSIS — R053 Chronic cough: Secondary | ICD-10-CM

## 2019-08-03 DIAGNOSIS — R059 Cough, unspecified: Secondary | ICD-10-CM

## 2019-08-03 MED ORDER — BUDESONIDE-FORMOTEROL FUMARATE 80-4.5 MCG/ACT IN AERO: 2.0000 | INHALATION_SPRAY | Freq: Two times a day (BID) | RESPIRATORY_TRACT | 3 refills | Status: DC

## 2019-08-03 MED ORDER — LOSARTAN POTASSIUM 25 MG PO TABS: 25.0000 mg | ORAL_TABLET | Freq: Every day | ORAL | 3 refills | Status: DC

## 2019-08-03 MED ORDER — FAMOTIDINE 20 MG PO TABS: 20.0000 mg | ORAL_TABLET | Freq: Every day | ORAL | 0 refills | Status: DC

## 2019-08-03 NOTE — Patient Instructions (Signed)
Thank you for choosing [facility] for your care today. I have changed your blood pressure medication to one that may help with any potential side effect contributing to your cough.  Please take losartan 25 mg daily for your blood pressure and stop taking the lisinopril. I have also prescribed a medication to help with gastric reflux that could also be contributing to your cough.  This medication is called famotidine.  Please take this medication daily and discuss at follow-up with your primary care physician if there is no improvement. I have also prescribed an inhaler that you should inhale 2 puffs daily, this should help to open your airways and hopefully help decrease your cough. Please go to the emergency room if you have worsening shortness of breath or worsening chest pain. I have also ordered a x-ray of your chest.  Please go to [facility] imaging on [address], the order is placed so you should be able to walk in whenever is convenient for you.  I will notify you of abnormal results.  Best wishes in health, Dr. Alba Cory Cough, Adult A cough helps to clear your throat and lungs. A cough may be a sign of an illness or another medical condition. An acute cough may only last 2-3 weeks, while a chronic cough may last 8 or more weeks. Many things can cause a cough. They include:  Germs (viruses or bacteria) that attack the airway.  Breathing in things that bother (irritate) your lungs.  Allergies.  Asthma.  Mucus that runs down the back of your throat (postnasal drip).  Smoking.  Acid backing up from the stomach into the tube that moves food from the mouth to the stomach (gastroesophageal reflux).  Some medicines.  Lung problems.  Other medical conditions, such as heart failure or a blood clot in the lung (pulmonary embolism). Follow these instructions at home: Medicines  Take over-the-counter and prescription medicines only as told by your doctor.  Talk with your  doctor before you take medicines that stop a cough (coughsuppressants). Lifestyle   Do not smoke, and try not to be around smoke. Do not use any products that contain nicotine or tobacco, such as cigarettes, e-cigarettes, and chewing tobacco. If you need help quitting, ask your doctor.  Drink enough fluid to keep your pee (urine) pale yellow.  Avoid caffeine.  Do not drink alcohol if your doctor tells you not to drink. General instructions   Watch for any changes in your cough. Tell your doctor about them.  Always cover your mouth when you cough.  Stay away from things that make you cough, such as perfume, candles, campfire smoke, or cleaning products.  If the air is dry, use a cool mist vaporizer or humidifier in your home.  If your cough is worse at night, try using extra pillows to raise your head up higher while you sleep.  Rest as needed.  Keep all follow-up visits as told by your doctor. This is important. Contact a doctor if:  You have new symptoms.  You cough up pus.  Your cough does not get better after 2-3 weeks, or your cough gets worse.  Cough medicine does not help your cough and you are not sleeping well.  You have pain that gets worse or pain that is not helped with medicine.  You have a fever.  You are losing weight and you do not know why.  You have night sweats. Get help right away if:  You cough up blood.  You  have trouble breathing.  Your heartbeat is very fast. These symptoms may be an emergency. Do not wait to see if the symptoms will go away. Get medical help right away. Call your local emergency services (911 in the [place] Do not drive yourself to the hospital. Summary  A cough helps to clear your throat and lungs. Many things can cause a cough.  Take over-the-counter and prescription medicines only as told by your doctor.  Always cover your mouth when you cough.  Contact a doctor if you have new symptoms or you have a cough that  does not get better or gets worse. This information is not intended to replace advice given to you by your health care provider. Make sure you discuss any questions you have with your health care provider. Document Revised: 01/16/2018 Document Reviewed: 01/16/2018 Elsevier Patient Education  [place].

## 2019-08-03 NOTE — Progress Notes (Signed)
° ° °  SUBJECTIVE:   CHIEF COMPLAINT / HPI: persistent dry cough   Patient reports having a dry cough that started four months ago  She has since been started on lisinopril during this month  Been vomitting after coughing fits  Since Wednesday, patient reports feeling more tired and has started to have back pain due to all of the coughing  She also reports some discomfort in her chest that she rates as 3/10 in severity, denies pain that travels into her neck or GI upset although she does report GERD symptoms  Denies fevers or chills  Denies HA  Does report increased DOE She reports that she has tried multiple OTC antitussive medications without improvement as well as tessalon pearles, reports that she does not have inhaler and uses Flonase regularly   Recently diagnosed with acute renal failure and would like to see a   Nephrologist   PERTINENT  PMH / PSH:  HTN Acute on chronic kidney failure  Obesity   OBJECTIVE:   Pulse 90    Temp 98.7 F (37.1 C) (Oral)    Ht 5\' 3"  (1.6 m)    SpO2 98%    BMI 30.03 kg/m    General: female appearing stated age frequently coughing during exam HEENT: MMM, no oral lesions noted,Neck non-tender without lymphadenopathy, no oropharyngeal erythema  Cardio: Normal S1 and S2, no S3 or S4. Rhythm is regular.  Bilateral radial pulses palpable Pulm: Clear to auscultation bilaterally, no crackles, intermittent wheezing,  diminished breath sounds during exhalation bilaterally. Abdomen: Bowel sounds normal. Abdomen soft and non-tender. Extremities: No peripheral edema.  Neuro: pt alert and oriented x4   ASSESSMENT/PLAN:   Acute on chronic kidney failure [facility]) Referral to nephrology    Chronic cough Differential for chronic cough remains broad. Low suspicion for post infectious cough given duration of symtpoms, patient does not appear to have PE findings consistent with allergies. Patient would benefit from PFTs to further evaluate lung function for  potential cause. Patient endorses symptoms of GERD. Described chest discomfort appears to be more related to rib soreness due to repeated coughing. Patient cautioned to return if chest discomfort worsens. Pulmonary exam with some wheezing and decreased breath sounds with exhalation make one consider some type of pulmonary pathology.   - CXR ordered  -Symbicort started  -started famotidine for GERD symptoms in setting of cough  -switched lisinopril to losartan in order to remove a potential contributor to cough     Eulis Foster, MD [place]

## 2019-08-05 DIAGNOSIS — R0789 Other chest pain: Secondary | ICD-10-CM | POA: Diagnosis not present

## 2019-08-05 DIAGNOSIS — N179 Acute kidney failure, unspecified: Secondary | ICD-10-CM | POA: Diagnosis not present

## 2019-08-05 DIAGNOSIS — J45991 Cough variant asthma: Secondary | ICD-10-CM | POA: Diagnosis not present

## 2019-08-05 DIAGNOSIS — R200 Anesthesia of skin: Secondary | ICD-10-CM | POA: Diagnosis not present

## 2019-08-05 DIAGNOSIS — I639 Cerebral infarction, unspecified: Secondary | ICD-10-CM | POA: Diagnosis not present

## 2019-08-05 DIAGNOSIS — R053 Chronic cough: Secondary | ICD-10-CM

## 2019-08-05 DIAGNOSIS — R05 Cough: Secondary | ICD-10-CM | POA: Diagnosis not present

## 2019-08-05 DIAGNOSIS — I129 Hypertensive chronic kidney disease with stage 1 through stage 4 chronic kidney disease, or unspecified chronic kidney disease: Secondary | ICD-10-CM | POA: Diagnosis not present

## 2019-08-05 DIAGNOSIS — E875 Hyperkalemia: Secondary | ICD-10-CM | POA: Diagnosis not present

## 2019-08-05 DIAGNOSIS — Z20822 Contact with and (suspected) exposure to covid-19: Secondary | ICD-10-CM | POA: Diagnosis not present

## 2019-08-05 DIAGNOSIS — I10 Essential (primary) hypertension: Secondary | ICD-10-CM | POA: Diagnosis not present

## 2019-08-05 DIAGNOSIS — M542 Cervicalgia: Secondary | ICD-10-CM | POA: Diagnosis not present

## 2019-08-05 DIAGNOSIS — N189 Chronic kidney disease, unspecified: Secondary | ICD-10-CM | POA: Diagnosis not present

## 2019-08-05 HISTORY — DX: Chronic cough: R05.3

## 2019-08-05 NOTE — Assessment & Plan Note (Signed)
Differential for chronic cough remains broad. Low suspicion for post infectious cough given duration of symtpoms, patient does not appear to have PE findings consistent with allergies. Patient would benefit from PFTs to further evaluate lung function for potential cause. Patient endorses symptoms of GERD. Described chest discomfort appears to be more related to rib soreness due to repeated coughing. Patient cautioned to return if chest discomfort worsens. Pulmonary exam with some wheezing and decreased breath sounds with exhalation make one consider some type of pulmonary pathology.   - CXR ordered  -Symbicort started  -started famotidine for GERD symptoms in setting of cough  -switched lisinopril to losartan in order to remove a potential contributor to cough

## 2019-08-05 NOTE — Assessment & Plan Note (Signed)
Referral to nephrology. 

## 2019-08-06 DIAGNOSIS — N179 Acute kidney failure, unspecified: Secondary | ICD-10-CM | POA: Diagnosis not present

## 2019-08-06 DIAGNOSIS — R918 Other nonspecific abnormal finding of lung field: Secondary | ICD-10-CM | POA: Diagnosis not present

## 2019-08-06 DIAGNOSIS — E875 Hyperkalemia: Secondary | ICD-10-CM | POA: Diagnosis not present

## 2019-08-06 DIAGNOSIS — I10 Essential (primary) hypertension: Secondary | ICD-10-CM | POA: Diagnosis not present

## 2019-08-06 DIAGNOSIS — R0602 Shortness of breath: Secondary | ICD-10-CM | POA: Diagnosis not present

## 2019-08-06 DIAGNOSIS — N189 Chronic kidney disease, unspecified: Secondary | ICD-10-CM | POA: Diagnosis not present

## 2019-08-06 DIAGNOSIS — J45991 Cough variant asthma: Secondary | ICD-10-CM | POA: Diagnosis not present

## 2019-08-07 DIAGNOSIS — N189 Chronic kidney disease, unspecified: Secondary | ICD-10-CM | POA: Diagnosis not present

## 2019-08-07 DIAGNOSIS — I10 Essential (primary) hypertension: Secondary | ICD-10-CM | POA: Diagnosis not present

## 2019-08-07 DIAGNOSIS — E875 Hyperkalemia: Secondary | ICD-10-CM | POA: Diagnosis not present

## 2019-08-07 DIAGNOSIS — N179 Acute kidney failure, unspecified: Secondary | ICD-10-CM | POA: Diagnosis not present

## 2019-08-07 DIAGNOSIS — J45991 Cough variant asthma: Secondary | ICD-10-CM | POA: Diagnosis not present

## 2019-08-08 DIAGNOSIS — N189 Chronic kidney disease, unspecified: Secondary | ICD-10-CM | POA: Diagnosis not present

## 2019-08-08 DIAGNOSIS — N179 Acute kidney failure, unspecified: Secondary | ICD-10-CM | POA: Diagnosis not present

## 2019-08-08 DIAGNOSIS — E875 Hyperkalemia: Secondary | ICD-10-CM | POA: Diagnosis not present

## 2019-08-08 DIAGNOSIS — J45991 Cough variant asthma: Secondary | ICD-10-CM | POA: Diagnosis not present

## 2019-08-08 DIAGNOSIS — I10 Essential (primary) hypertension: Secondary | ICD-10-CM | POA: Diagnosis not present

## 2019-08-09 DIAGNOSIS — I129 Hypertensive chronic kidney disease with stage 1 through stage 4 chronic kidney disease, or unspecified chronic kidney disease: Secondary | ICD-10-CM | POA: Diagnosis not present

## 2019-08-09 DIAGNOSIS — I10 Essential (primary) hypertension: Secondary | ICD-10-CM | POA: Diagnosis not present

## 2019-08-09 DIAGNOSIS — Z23 Encounter for immunization: Secondary | ICD-10-CM | POA: Diagnosis not present

## 2019-08-09 DIAGNOSIS — M542 Cervicalgia: Secondary | ICD-10-CM | POA: Diagnosis not present

## 2019-08-09 DIAGNOSIS — Z79899 Other long term (current) drug therapy: Secondary | ICD-10-CM | POA: Diagnosis not present

## 2019-08-09 DIAGNOSIS — N1832 Chronic kidney disease, stage 3b: Secondary | ICD-10-CM | POA: Diagnosis not present

## 2019-08-09 DIAGNOSIS — N189 Chronic kidney disease, unspecified: Secondary | ICD-10-CM | POA: Diagnosis not present

## 2019-08-09 DIAGNOSIS — Z20822 Contact with and (suspected) exposure to covid-19: Secondary | ICD-10-CM | POA: Diagnosis not present

## 2019-08-09 DIAGNOSIS — J45991 Cough variant asthma: Secondary | ICD-10-CM | POA: Diagnosis not present

## 2019-08-09 DIAGNOSIS — N179 Acute kidney failure, unspecified: Secondary | ICD-10-CM | POA: Diagnosis not present

## 2019-08-09 DIAGNOSIS — R05 Cough: Secondary | ICD-10-CM | POA: Diagnosis not present

## 2019-08-09 DIAGNOSIS — E875 Hyperkalemia: Secondary | ICD-10-CM | POA: Diagnosis not present

## 2019-08-10 DIAGNOSIS — N179 Acute kidney failure, unspecified: Secondary | ICD-10-CM | POA: Diagnosis not present

## 2019-08-10 DIAGNOSIS — N183 Chronic kidney disease, stage 3 unspecified: Secondary | ICD-10-CM | POA: Diagnosis not present

## 2019-08-11 DIAGNOSIS — N183 Chronic kidney disease, stage 3 unspecified: Secondary | ICD-10-CM | POA: Diagnosis not present

## 2019-08-11 DIAGNOSIS — N179 Acute kidney failure, unspecified: Secondary | ICD-10-CM | POA: Diagnosis not present

## 2019-08-14 DIAGNOSIS — I129 Hypertensive chronic kidney disease with stage 1 through stage 4 chronic kidney disease, or unspecified chronic kidney disease: Secondary | ICD-10-CM | POA: Diagnosis not present

## 2019-08-14 DIAGNOSIS — R079 Chest pain, unspecified: Secondary | ICD-10-CM | POA: Diagnosis not present

## 2019-08-14 DIAGNOSIS — N83202 Unspecified ovarian cyst, left side: Secondary | ICD-10-CM | POA: Diagnosis not present

## 2019-08-14 DIAGNOSIS — R109 Unspecified abdominal pain: Secondary | ICD-10-CM | POA: Diagnosis not present

## 2019-08-14 DIAGNOSIS — D72829 Elevated white blood cell count, unspecified: Secondary | ICD-10-CM | POA: Diagnosis not present

## 2019-08-14 DIAGNOSIS — N189 Chronic kidney disease, unspecified: Secondary | ICD-10-CM | POA: Diagnosis not present

## 2019-08-14 DIAGNOSIS — K5730 Diverticulosis of large intestine without perforation or abscess without bleeding: Secondary | ICD-10-CM | POA: Diagnosis not present

## 2019-08-14 DIAGNOSIS — R7989 Other specified abnormal findings of blood chemistry: Secondary | ICD-10-CM | POA: Diagnosis not present

## 2019-08-14 DIAGNOSIS — R0602 Shortness of breath: Secondary | ICD-10-CM | POA: Diagnosis not present

## 2019-08-15 DIAGNOSIS — R0602 Shortness of breath: Secondary | ICD-10-CM | POA: Diagnosis not present

## 2019-08-15 DIAGNOSIS — N83202 Unspecified ovarian cyst, left side: Secondary | ICD-10-CM | POA: Diagnosis not present

## 2019-08-15 DIAGNOSIS — R079 Chest pain, unspecified: Secondary | ICD-10-CM | POA: Diagnosis not present

## 2019-08-15 DIAGNOSIS — K5730 Diverticulosis of large intestine without perforation or abscess without bleeding: Secondary | ICD-10-CM | POA: Diagnosis not present

## 2019-08-15 DIAGNOSIS — R7989 Other specified abnormal findings of blood chemistry: Secondary | ICD-10-CM | POA: Diagnosis not present

## 2019-08-17 ENCOUNTER — Ambulatory Visit: Payer: Medicaid Other | Admitting: Family Medicine

## 2019-08-17 ENCOUNTER — Ambulatory Visit (INDEPENDENT_AMBULATORY_CARE_PROVIDER_SITE_OTHER): Payer: Medicaid Other | Admitting: Family Medicine

## 2019-08-17 ENCOUNTER — Other Ambulatory Visit: Payer: Self-pay

## 2019-08-17 VITALS — BP 123/80 | HR 93 | Ht 63.0 in | Wt 188.8 lb

## 2019-08-17 DIAGNOSIS — I10 Essential (primary) hypertension: Secondary | ICD-10-CM

## 2019-08-17 DIAGNOSIS — E785 Hyperlipidemia, unspecified: Secondary | ICD-10-CM

## 2019-08-17 DIAGNOSIS — R911 Solitary pulmonary nodule: Secondary | ICD-10-CM

## 2019-08-17 DIAGNOSIS — R053 Chronic cough: Secondary | ICD-10-CM

## 2019-08-17 DIAGNOSIS — N63 Unspecified lump in unspecified breast: Secondary | ICD-10-CM | POA: Diagnosis not present

## 2019-08-17 DIAGNOSIS — R05 Cough: Secondary | ICD-10-CM

## 2019-08-17 DIAGNOSIS — N183 Chronic kidney disease, stage 3 unspecified: Secondary | ICD-10-CM

## 2019-08-17 DIAGNOSIS — N179 Acute kidney failure, unspecified: Secondary | ICD-10-CM | POA: Diagnosis present

## 2019-08-17 NOTE — Patient Instructions (Signed)
It was so wonderful to meet you today.  Please make sure you continue taking your medications, I will send in refills.   You can monitor your blood pressure every couple of days or if you are symptomatic with any lightheadedness/dizziness, headache, chest pain, vision changes --would also recommend evaluation in urgent care/ED if this occurs.   A referral to nephrology has already been placed, I will also replace a pulmonology referral.  I also placed an order to receive a mammogram and ultrasound, you should be able to call the breast center next week to get this scheduled.  I would like to see you back in 2 weeks or sooner if needed.

## 2019-08-17 NOTE — Progress Notes (Signed)
SUBJECTIVE:   CHIEF COMPLAINT / HPI: Hospital follow up   Zoe Woodard is a 39 year old female presenting for hospital follow-up due to AKI on CKD with hyperkalemia.  AKI Hospital follow-up: She was admitted at [facility] from 7/25-7/31. K 5.6 and creatinine 2.82 on admission, trended down to 2.18 by discharge with fluids.  Nephrology referral placed.  Her losartan was discontinued and started on Norvasc for her BP in addition to Lopressor.  A1c WNL.  Additionally during this time she reported a 51-month history of cough.  Initially thought it was due to congestion, however no OTC medications helped.  Covid/viral panel negative.  She was seen by pulmonology during stay and placed on Pepcid for reflux, breathing treatments, Singulair, Claritin, and a short steroid course which seemed to help a little bit but the cough persisted.  A pulmonology referral was placed at discharge, in addition for pulmonary nodule found incidentally.  CT chest showing a 0.4 cm lingular nodule and incidental 1.8 cm right breast mass, but otherwise no major lung changes.  She does not feel like the medication or Symbicort are helping much.  She additionally was evaluated again in the ED on 8/3 for right flank pain that was pleuritic in nature.  CT abdomen unremarkable, mild to moderate colonic stool.  VQ scan negative for PE.  Does have history of appendectomy.  Urinalysis negative.  Her flank pain has now since resolved.  Hypertension hospital follow-up: Also admitted on 7/10-7/11 for hypertensive urgency associated with headache.  BP on arrival 222/138 and had not been taking any of home medications.  Received IV medication with slow transition down.  Ultimately after next hospital stay, she is now on amlodipine 10 and metoprolol 50 twice daily doing well with this.  No current shortness of breath, chest pain, headache, visual changes.  Hyperlipidemia: Recently started on atorvastatin 40 mg.   PERTINENT  PMH / PSH:  Chronic kidney disease, elevated BMI, hypertension, cough  OBJECTIVE:   BP 123/80   Pulse 93   Ht 5\' 3"  (1.6 m)   Wt 188 lb 12.8 oz (85.6 kg)   SpO2 98%   BMI 33.44 kg/m   General: Alert, NAD HEENT: NCAT, MMM Cardiac: RRR no m/g/r Lungs: Clear bilaterally, no increased WOB, occasional dry cough during exam   Abdomen: soft, non-tender, non-distended Msk: Moves all extremities spontaneously  Ext: Warm, dry, no edema b/l    ASSESSMENT/PLAN:   Acute on chronic kidney failure (HCC) Nonoliguric, improving. Cr 2.1 on 8/4, previous known baseline 1.4-1.6 in 2020.  Suspected hypertensive nephropathy, however with chronic cough ?GPA/MPA like syndrome but this would be less likely without any hematuria/evidence of glomerulonephritis.  Mild insignificant proteinuria (~30). No contributing diabetes.  Nephrology referral already pending, will work on hypertensive management closely.  Essential hypertension Fortunately well controlled today with amlodipine 10 mg and Lopressor 50 BID.  Will continue as is and send in refills.  Monitor at home every several days or if symptomatic.  Hyperlipidemia Continue to atorvastatin 40 mg for primary prevention.  Will recheck lipid panel in the next 3 months.  Chronic cough Differential including postinfectious, allergies, GERD, reactive airway.  She is a non-smoker making COPD less likely.  Consider medication side effect, recently switched off of ACE/ARB onto Norvasc.  CT chest largely noncontributory, 0.4cm nodule present.  Evaluated by pulmonology during recent hospital stay, started on montelukast, Pepcid, and Symbicort trial with minimal improvement thus far.  Will continue to monitor, suspect will improve with time,  already referred to pulmonology on hospital d/c and awaiting call.  Lung nodule Incidental 0.4cm nodule.  Referred to pulmonology as above.  Breast mass 1.8 cm right breast mass found incidentally on imaging, will proceed with breast U/S  for further evaluation.  Hypocalcemia Ca 8.1 with normal albumin.  Likely in the setting of CKD.  Consider obtaining vitamin D and PTH on follow-up.    Follow-up in 2 weeks for the above or sooner if needed.  [name], [place]

## 2019-08-20 ENCOUNTER — Other Ambulatory Visit: Payer: Self-pay | Admitting: Family Medicine

## 2019-08-20 DIAGNOSIS — N63 Unspecified lump in unspecified breast: Secondary | ICD-10-CM

## 2019-08-22 ENCOUNTER — Encounter: Payer: Self-pay | Admitting: Family Medicine

## 2019-08-22 DIAGNOSIS — E785 Hyperlipidemia, unspecified: Secondary | ICD-10-CM | POA: Insufficient documentation

## 2019-08-22 DIAGNOSIS — N63 Unspecified lump in unspecified breast: Secondary | ICD-10-CM | POA: Insufficient documentation

## 2019-08-22 DIAGNOSIS — R911 Solitary pulmonary nodule: Secondary | ICD-10-CM | POA: Insufficient documentation

## 2019-08-22 HISTORY — DX: Hypocalcemia: E83.51

## 2019-08-22 MED ORDER — AMLODIPINE BESYLATE 10 MG PO TABS: 10.0000 mg | ORAL_TABLET | Freq: Every day | ORAL | 3 refills | Status: DC

## 2019-08-22 MED ORDER — FAMOTIDINE 20 MG PO TABS: 20.0000 mg | ORAL_TABLET | Freq: Every day | ORAL | 0 refills | Status: DC

## 2019-08-22 MED ORDER — ATORVASTATIN CALCIUM 40 MG PO TABS: 40.0000 mg | ORAL_TABLET | Freq: Every day | ORAL | 2 refills | Status: DC

## 2019-08-22 MED ORDER — METOPROLOL TARTRATE 50 MG PO TABS: 50.0000 mg | ORAL_TABLET | Freq: Two times a day (BID) | ORAL | 2 refills | Status: DC

## 2019-08-22 MED ORDER — MONTELUKAST SODIUM 10 MG PO TABS: 10.0000 mg | ORAL_TABLET | Freq: Every day | ORAL | 3 refills | Status: DC

## 2019-08-22 NOTE — Assessment & Plan Note (Addendum)
Differential including postinfectious, allergies, GERD, reactive airway.  She is a non-smoker making COPD less likely.  Consider medication side effect, recently switched off of ACE/ARB onto Norvasc.  CT chest largely noncontributory, 0.4cm nodule present.  Evaluated by pulmonology during recent hospital stay, started on montelukast, Pepcid, and Symbicort trial with minimal improvement thus far.  Will continue to monitor, suspect will improve with time, already referred to pulmonology on hospital d/c and awaiting call.

## 2019-08-22 NOTE — Assessment & Plan Note (Signed)
Ca 8.1 with normal albumin.  Likely in the setting of CKD.  Consider obtaining vitamin D and PTH on follow-up.

## 2019-08-22 NOTE — Assessment & Plan Note (Signed)
Continue to atorvastatin 40 mg for primary prevention.  Will recheck lipid panel in the next 3 months.

## 2019-08-22 NOTE — Assessment & Plan Note (Signed)
Nonoliguric, improving. Cr 2.1 on 8/4, previous known baseline 1.4-1.6 in 2020.  Suspected hypertensive nephropathy, however with chronic cough ?GPA/MPA like syndrome but this would be less likely without any hematuria/evidence of glomerulonephritis.  Mild insignificant proteinuria (~30). No contributing diabetes.  Nephrology referral already pending, will work on hypertensive management closely.

## 2019-08-22 NOTE — Assessment & Plan Note (Signed)
Incidental 0.4cm nodule.  Referred to pulmonology as above.

## 2019-08-22 NOTE — Assessment & Plan Note (Signed)
1.8 cm right breast mass found incidentally on imaging, will proceed with breast U/S for further evaluation.

## 2019-08-22 NOTE — Assessment & Plan Note (Addendum)
Fortunately well controlled today with amlodipine 10 mg and Lopressor 50 BID.  Will continue as is and send in refills.  Monitor at home every several days or if symptomatic.

## 2019-08-27 ENCOUNTER — Other Ambulatory Visit: Payer: Self-pay | Admitting: Family Medicine

## 2019-08-27 DIAGNOSIS — N63 Unspecified lump in unspecified breast: Secondary | ICD-10-CM

## 2019-09-04 ENCOUNTER — Other Ambulatory Visit: Payer: Self-pay

## 2019-09-04 ENCOUNTER — Encounter: Payer: Self-pay | Admitting: Family Medicine

## 2019-09-04 ENCOUNTER — Ambulatory Visit (INDEPENDENT_AMBULATORY_CARE_PROVIDER_SITE_OTHER): Payer: Medicaid Other | Admitting: Family Medicine

## 2019-09-04 VITALS — BP 126/90 | HR 108 | Wt 187.0 lb

## 2019-09-04 DIAGNOSIS — I10 Essential (primary) hypertension: Secondary | ICD-10-CM

## 2019-09-04 DIAGNOSIS — N179 Acute kidney failure, unspecified: Secondary | ICD-10-CM

## 2019-09-04 DIAGNOSIS — R05 Cough: Secondary | ICD-10-CM | POA: Diagnosis not present

## 2019-09-04 DIAGNOSIS — N63 Unspecified lump in unspecified breast: Secondary | ICD-10-CM

## 2019-09-04 DIAGNOSIS — E785 Hyperlipidemia, unspecified: Secondary | ICD-10-CM | POA: Diagnosis not present

## 2019-09-04 DIAGNOSIS — N183 Chronic kidney disease, stage 3 unspecified: Secondary | ICD-10-CM

## 2019-09-04 DIAGNOSIS — R053 Chronic cough: Secondary | ICD-10-CM

## 2019-09-04 NOTE — Patient Instructions (Addendum)
Kidney doctor: [facility]. [phone] (p)  Blood pressure: Stop norvasc, continue metoprolol. BP top number over 100, lower than 130. Check every few days and keep journal. If any lightheadedness, dizziness, fatigue, or headache please check BP.

## 2019-09-04 NOTE — Progress Notes (Signed)
° ° °  SUBJECTIVE:   CHIEF COMPLAINT / HPI:  Follow up    Zoe Woodard is a 39 year old female present for follow-up of the following:  Hypertension: Has been using her Norvasc and Lopressor on "as needed" basis.  Has been monitoring her BP at home every several days and usually ranging around 161-096 systolics with around 04V diastolic.  She has used both medications a few times, and has noticed intermittent bilateral lower extremity foot swelling on occasion.  Gets better when she elevates her legs.  Only noticed for about 1-2 days.   Kidney disease: Urinating as normal.  Has not heard from nephrology yet.  Chronic cough: No better.  Feels like the Symbicort makes it worse.  Has not heard from pulmonology yet.  Breast mass on imaging: Scheduled for mammogram/right breast ultrasound on 8/30.  PERTINENT  PMH / PSH: Chronic kidney disease, elevated BMI, hypertension, cough  OBJECTIVE:   BP 126/90    Pulse (!) 108    Wt 187 lb (84.8 kg)    SpO2 98%    BMI 33.13 kg/m   General: Alert, NAD HEENT: NCAT, MMM Cardiac: RRR no m/g/r Lungs: Clear bilaterally, no increased WOB  Msk: Moves all extremities spontaneously  Ext: Warm, dry, 2+ distal pulses, no edema present on bilateral lower extremity  ASSESSMENT/PLAN:   Essential hypertension Reasonable control, 126/90 in the office today.  Has been taking her medications on "PRN" basis.  Despite relatively unremarkable values she reports at home, given her recent history of hypertensive urgency and worsening renal function would like to proceed with tight hypertensive control.  Recommended discontinuing Norvasc d/t recent pedal edema and taking Lopressor 25mg  BID on a scheduled basis.  Previously trialed ACE/ARB/thiazide with tenuous renal function.  Monitor BP every 2-3 days and bring journal into next visit.  Breast mass U/S and mammogram scheduled for 8/30.  Will follow along.  Chronic cough Unclear etiology, considerations including  postinfectious/allergic, GERD, reactive airway. Evaluated by pulmonology during recent hospital stay and referred on discharge but has not heard from anyone yet.  Will replace referral through [facility] system.  Continue montelukast and Pepcid as is.  Hyperlipidemia Primary prevention with atorvastatin.  Acute on chronic kidney failure (HCC) Nonoliguric. Cr 2.1 on 8/4.  Awaiting nephrology referral, provided with [facility] contact information to schedule appointment.    Follow-up if any lightheadedness/dizziness, headaches, CP/SOB, or if BP consistently > 130/80, otherwise follow-up in 1 month for above.   [name], [place]

## 2019-09-05 ENCOUNTER — Encounter: Payer: Self-pay | Admitting: Family Medicine

## 2019-09-05 ENCOUNTER — Other Ambulatory Visit: Payer: Self-pay | Admitting: Family Medicine

## 2019-09-05 DIAGNOSIS — R911 Solitary pulmonary nodule: Secondary | ICD-10-CM

## 2019-09-05 DIAGNOSIS — R053 Chronic cough: Secondary | ICD-10-CM

## 2019-09-05 NOTE — Assessment & Plan Note (Addendum)
Unclear etiology, considerations including postinfectious/allergic, GERD, reactive airway. Evaluated by pulmonology during recent hospital stay and referred on discharge but has not heard from anyone yet.  Will replace referral through [facility] system.  Continue montelukast and Pepcid as is.

## 2019-09-05 NOTE — Assessment & Plan Note (Signed)
Primary prevention with atorvastatin 

## 2019-09-05 NOTE — Assessment & Plan Note (Signed)
Nonoliguric. Cr 2.1 on 8/4.  Awaiting nephrology referral, provided with [facility] contact information to schedule appointment.

## 2019-09-05 NOTE — Assessment & Plan Note (Signed)
Reasonable control, 126/90 in the office today.  Has been taking her medications on "PRN" basis.  Despite relatively unremarkable values she reports at home, given her recent history of hypertensive urgency and worsening renal function would like to proceed with tight hypertensive control.  Recommended discontinuing Norvasc d/t recent pedal edema and taking Lopressor 25mg  BID on a scheduled basis.  Previously trialed ACE/ARB/thiazide with tenuous renal function.  Monitor BP every 2-3 days and bring journal into next visit.

## 2019-09-05 NOTE — Assessment & Plan Note (Signed)
U/S and mammogram scheduled for 8/30.  Will follow along.

## 2019-09-08 ENCOUNTER — Other Ambulatory Visit: Payer: Self-pay | Admitting: Family Medicine

## 2019-09-08 DIAGNOSIS — R053 Chronic cough: Secondary | ICD-10-CM

## 2019-09-10 ENCOUNTER — Other Ambulatory Visit: Payer: Self-pay | Admitting: Family Medicine

## 2019-09-10 ENCOUNTER — Ambulatory Visit
Admission: RE | Admit: 2019-09-10 | Discharge: 2019-09-10 | Disposition: A | Discharge status: Home / Self Care | Payer: Medicaid Other | Source: Ambulatory Visit | Attending: Family Medicine | Admitting: Family Medicine

## 2019-09-10 ENCOUNTER — Other Ambulatory Visit: Payer: Self-pay

## 2019-09-10 ENCOUNTER — Telehealth: Payer: Self-pay | Admitting: Family Medicine

## 2019-09-10 DIAGNOSIS — R922 Inconclusive mammogram: Secondary | ICD-10-CM | POA: Diagnosis not present

## 2019-09-10 DIAGNOSIS — R053 Chronic cough: Secondary | ICD-10-CM

## 2019-09-10 DIAGNOSIS — N63 Unspecified lump in unspecified breast: Secondary | ICD-10-CM

## 2019-09-10 DIAGNOSIS — N6021 Fibroadenosis of right breast: Secondary | ICD-10-CM | POA: Diagnosis not present

## 2019-09-10 DIAGNOSIS — N6310 Unspecified lump in the right breast, unspecified quadrant: Secondary | ICD-10-CM

## 2019-09-10 MED ORDER — MONTELUKAST SODIUM 10 MG PO TABS: 10.0000 mg | ORAL_TABLET | Freq: Every day | ORAL | 3 refills | Status: DC

## 2019-09-10 NOTE — Telephone Encounter (Signed)
Patient comes into front office with questions regarding medication management.   Patient reports medication compliance with new regimen since OV on 8/24. Patient last took half tablet of lopressor this morning around 9 am. Patient is consistently reporting elevated Bps ranging in the 374U-514U systolic. Patient reports increased fatigue but denies severe headache, visual changes, chest or arm pain.   Obtained patient's vital signs  BP: 138/92 HR: 80 SpO2: 98% RA. Denies current HTN symptoms.   Spoke with Dr. Higinio Plan regarding patient. Per Dr. Higinio Plan patient should restart norvasc. Patient is to take half tablet (5 mg) at bedtime and continue current regimen of lopressor (0.5 tablet two times daily).   Patient verbalizes understanding. Will follow up next Wednesday, 9/8 at 10:00 am for nurse visit to recheck BP. Strict return/ ED precautions given.   Patient is requesting rx refills on Loratadine 10 mg and singulair as well.   Will forward message to PCP.   Talbot Grumbling, RN

## 2019-09-10 NOTE — Telephone Encounter (Signed)
Pt walked in stating she needs a another referral to the kidney doctor. [facility] stated they do not have a record of referral and to resend.  Pls call patient [phone]

## 2019-09-12 NOTE — Telephone Encounter (Signed)
Please let patient know the referral is in and await a call to schedule.   Thank you!  Dr Higinio Plan

## 2019-09-12 NOTE — Telephone Encounter (Signed)
I faxed the referral to nephrology mid July and it takes them about 3-4 weeks to even review the chart and all the information I send (about 2 years worth of labs/notes).  They then rate the patient on a scale 1-4 depending on level of urgency.  Depending on where this patient falls will determine how soon they call her for an appointment. Rourke Mcquitty,CMA

## 2019-09-12 NOTE — Telephone Encounter (Signed)
I see this referral in place to [facility] in her chart already, did this not go through for some reason? Jazmin, when you have a chance could you see if this maybe got halted for some reason?   Thank you!  Dr Higinio Plan

## 2019-09-13 ENCOUNTER — Other Ambulatory Visit: Payer: Self-pay | Admitting: Family Medicine

## 2019-09-13 ENCOUNTER — Telehealth: Payer: Self-pay

## 2019-09-13 DIAGNOSIS — R053 Chronic cough: Secondary | ICD-10-CM

## 2019-09-13 MED ORDER — LORATADINE 10 MG PO TABS: 10.0000 mg | ORAL_TABLET | Freq: Every day | ORAL | 11 refills | Status: DC

## 2019-09-13 NOTE — Telephone Encounter (Signed)
Called patient and informed her that referral is in and that [facility] will be calling her to schedule an appointment.  Also advised patient to call CN if she had not heard from them in two weeks.  Zoe Woodard, [place]

## 2019-09-13 NOTE — Telephone Encounter (Signed)
Refill sent.   Thank you!  [name], DO

## 2019-09-19 ENCOUNTER — Other Ambulatory Visit: Payer: Self-pay

## 2019-09-19 ENCOUNTER — Other Ambulatory Visit: Payer: Self-pay | Admitting: Family Medicine

## 2019-09-19 ENCOUNTER — Ambulatory Visit (INDEPENDENT_AMBULATORY_CARE_PROVIDER_SITE_OTHER): Payer: Medicaid Other

## 2019-09-19 VITALS — BP 115/80 | HR 84

## 2019-09-19 DIAGNOSIS — I10 Essential (primary) hypertension: Secondary | ICD-10-CM

## 2019-09-19 MED ORDER — METOPROLOL TARTRATE 25 MG PO TABS: 25.0000 mg | ORAL_TABLET | Freq: Two times a day (BID) | ORAL | 1 refills | Status: DC

## 2019-09-19 NOTE — Progress Notes (Signed)
Patient here today for BP check.      Last BP was on 09/04/2019 and 126/90.  BP today is 115/80 with a pulse of 84.    Checked BP in left arm with large cuff.    Symptoms present:none.   Routed note to PCP.

## 2019-09-25 ENCOUNTER — Ambulatory Visit (INDEPENDENT_AMBULATORY_CARE_PROVIDER_SITE_OTHER): Payer: Medicaid Other

## 2019-09-25 ENCOUNTER — Other Ambulatory Visit: Payer: Self-pay

## 2019-09-25 DIAGNOSIS — Z23 Encounter for immunization: Secondary | ICD-10-CM | POA: Diagnosis present

## 2019-09-25 NOTE — Progress Notes (Signed)
Patient presents in nurse clinic for Flu Vaccine.  Vaccine given RD without complication.   See admin for details.

## 2019-09-27 DIAGNOSIS — N184 Chronic kidney disease, stage 4 (severe): Secondary | ICD-10-CM | POA: Diagnosis not present

## 2019-09-27 DIAGNOSIS — R799 Abnormal finding of blood chemistry, unspecified: Secondary | ICD-10-CM | POA: Diagnosis not present

## 2019-09-27 DIAGNOSIS — R05 Cough: Secondary | ICD-10-CM | POA: Diagnosis not present

## 2019-09-27 DIAGNOSIS — I129 Hypertensive chronic kidney disease with stage 1 through stage 4 chronic kidney disease, or unspecified chronic kidney disease: Secondary | ICD-10-CM | POA: Diagnosis not present

## 2019-10-03 ENCOUNTER — Other Ambulatory Visit: Payer: Self-pay

## 2019-10-03 ENCOUNTER — Ambulatory Visit (INDEPENDENT_AMBULATORY_CARE_PROVIDER_SITE_OTHER): Payer: Medicaid Other | Admitting: Family Medicine

## 2019-10-03 ENCOUNTER — Encounter: Payer: Self-pay | Admitting: Family Medicine

## 2019-10-03 VITALS — BP 110/75 | HR 77 | Ht 63.0 in | Wt 188.0 lb

## 2019-10-03 DIAGNOSIS — R05 Cough: Secondary | ICD-10-CM | POA: Diagnosis not present

## 2019-10-03 DIAGNOSIS — N179 Acute kidney failure, unspecified: Secondary | ICD-10-CM

## 2019-10-03 DIAGNOSIS — I10 Essential (primary) hypertension: Secondary | ICD-10-CM | POA: Diagnosis present

## 2019-10-03 DIAGNOSIS — N63 Unspecified lump in unspecified breast: Secondary | ICD-10-CM | POA: Diagnosis not present

## 2019-10-03 DIAGNOSIS — N183 Chronic kidney disease, stage 3 unspecified: Secondary | ICD-10-CM | POA: Diagnosis not present

## 2019-10-03 DIAGNOSIS — R053 Chronic cough: Secondary | ICD-10-CM

## 2019-10-03 MED ORDER — FAMOTIDINE 20 MG PO TABS: 20.0000 mg | ORAL_TABLET | Freq: Every day | ORAL | 3 refills | Status: DC

## 2019-10-03 MED ORDER — FLUTICASONE PROPIONATE 50 MCG/ACT NA SUSP: 2.0000 | Freq: Every day | NASAL | 1 refills | Status: DC

## 2019-10-03 NOTE — Assessment & Plan Note (Signed)
Mild improvement since last visit.  Unclear etiology, most likely considering allergic/GERD.  Awaiting pulmonology referral, do question if related to sudden CKD stage IV as above.  Continue Pepcid, Claritin, montelukast, and Flonase.  Refilled Flonase/Pepcid today.

## 2019-10-03 NOTE — Progress Notes (Signed)
    SUBJECTIVE:   CHIEF COMPLAINT / HPI: Follow-up hypertension  Zoe Woodard is a 39 year old female presenting discussed the following:  Hypertension: Currently taking Lopressor 25 mg twice daily and Norvasc 5 mg. Reports she has been checking her blood pressure at home and is ranging around systolics 482-707 and diastolic around 86-75. She is tolerating her blood pressure medications well without any chest pain, shortness of breath, headaches, lightheadedness/dizziness.  CKD: Established with [facility] on 9/16 and had several labs drawn. Autoimmune work-up underway and reports she was told her kidneys were still around CKD stage IV.  Pending results of labs, they discussed she may need biopsy.  Chronic cough: A little bit better however still present often, needs a refill of Pepcid. Feels like her cough is worse after she eats cinnamon within food. Waiting on pulmonology referral.   PERTINENT  PMH / PSH: Chronic kidney disease,elevated BMI, hypertension  OBJECTIVE:   BP 110/75   Pulse 77   Ht 5\' 3"  (1.6 m)   Wt 188 lb (85.3 kg)   SpO2 99%   BMI 33.30 kg/m   General: Alert, NAD HEENT: NCAT, MMM Cardiac: RRR no m/g/r Lungs: Clear bilaterally, no increased WOB, no wheezing or crackles noted   Msk: Moves all extremities spontaneously  Ext: Warm, dry, 2+ distal pulses, no edema b/l  ASSESSMENT/PLAN:   Acute on chronic kidney failure (HCC) CKD stage IV of unclear etiology.  Establish care with [facility] on 9/16, will obtain records, currently undergoing autoimmune/immunologic work-up.  Essential hypertension Well-controlled in office and at home, will continue with Lopressor 25 mg BID and Norvasc 5 mg as is.  Chronic cough Mild improvement since last visit.  Unclear etiology, most likely considering allergic/GERD.  Awaiting pulmonology referral, do question if related to sudden CKD stage IV as above.  Continue Pepcid, Claritin, montelukast, and Flonase.  Refilled  Flonase/Pepcid today.  Breast mass Benign nodule seen on U/S, has appointment scheduled for follow-up in 6 months.    Follow-up in 1 month for above or sooner if needed, should collect HIV/hepatitis C for screening on follow-up visit.  [name], [place]

## 2019-10-03 NOTE — Assessment & Plan Note (Signed)
CKD stage IV of unclear etiology.  Establish care with [facility] on 9/16, will obtain records, currently undergoing autoimmune/immunologic work-up.

## 2019-10-03 NOTE — Assessment & Plan Note (Signed)
Benign nodule seen on U/S, has appointment scheduled for follow-up in 6 months.

## 2019-10-03 NOTE — Patient Instructions (Addendum)
It was wonderful seeing you today.  We will try to get the records from [facility] so that we can have these in our system as well.  I have refilled Pepcid and Flonase they should both be at the pharmacy.  Continue to check your blood pressure 1-2 times weekly and bring a journal in for your follow-up visit.

## 2019-10-03 NOTE — Assessment & Plan Note (Signed)
Well-controlled in office and at home, will continue with Lopressor 25 mg BID and Norvasc 5 mg as is.

## 2019-10-09 ENCOUNTER — Ambulatory Visit (INDEPENDENT_AMBULATORY_CARE_PROVIDER_SITE_OTHER): Payer: Medicaid Other | Admitting: Student in an Organized Health Care Education/Training Program

## 2019-10-09 ENCOUNTER — Other Ambulatory Visit: Payer: Self-pay

## 2019-10-09 VITALS — BP 100/80 | HR 81 | Ht 63.0 in | Wt 188.8 lb

## 2019-10-09 DIAGNOSIS — G5620 Lesion of ulnar nerve, unspecified upper limb: Secondary | ICD-10-CM

## 2019-10-09 DIAGNOSIS — M549 Dorsalgia, unspecified: Secondary | ICD-10-CM

## 2019-10-09 DIAGNOSIS — M546 Pain in thoracic spine: Secondary | ICD-10-CM

## 2019-10-09 DIAGNOSIS — I120 Hypertensive chronic kidney disease with stage 5 chronic kidney disease or end stage renal disease: Secondary | ICD-10-CM | POA: Diagnosis not present

## 2019-10-09 DIAGNOSIS — N185 Chronic kidney disease, stage 5: Secondary | ICD-10-CM | POA: Diagnosis not present

## 2019-10-09 DIAGNOSIS — N184 Chronic kidney disease, stage 4 (severe): Secondary | ICD-10-CM

## 2019-10-09 LAB — POCT URINALYSIS DIP (MANUAL ENTRY)
Bilirubin, UA: NEGATIVE
Glucose, UA: NEGATIVE mg/dL
Ketones, POC UA: NEGATIVE mg/dL
Leukocytes, UA: NEGATIVE
Nitrite, UA: NEGATIVE
Protein Ur, POC: 100 mg/dL — AB
Spec Grav, UA: 1.01 (ref 1.010–1.025)
Urobilinogen, UA: 0.2 E.U./dL
pH, UA: 5.5 (ref 5.0–8.0)

## 2019-10-09 LAB — POCT UA - MICROSCOPIC ONLY

## 2019-10-09 MED ORDER — GABAPENTIN 300 MG PO CAPS: 300.0000 mg | ORAL_CAPSULE | Freq: Every day | ORAL | 0 refills | Status: DC

## 2019-10-09 NOTE — Patient Instructions (Signed)
It was a pleasure to see you today!  To summarize our discussion for this visit:  For your back, you can continue to take your muscle relaxer as needed and I would suggest starting some back muscle strengthening exercises which I am including here. It does not appear that there is an infection in your urine.  For your hands, we can try a nerve pain medication (gabapentin) at a low dose which will likely make you sleepy so take at night. I would also recommend using a padded glove and/or brace at night to help prevent worsening of the nerve pinching in your wrist. I am checking labs today to see if there are electrolyte issues that could be contributing as well.  Please follow up with your nephrologist as directed by them  Some additional health maintenance measures we should update are: Health Maintenance Due  Topic Date Due  . Hepatitis C Screening  Never done  . COVID-19 Vaccine (1) Never done  . HIV Screening  Never done  . TETANUS/TDAP  Never done  .    Call the clinic at [phone] if your symptoms worsen or you have any concerns.   Thank you for allowing me to take part in your care,  Dr. Doristine Mango   Low Back Sprain or Strain Rehab Ask your health care provider which exercises are safe for you. Do exercises exactly as told by your health care provider and adjust them as directed. It is normal to feel mild stretching, pulling, tightness, or discomfort as you do these exercises. Stop right away if you feel sudden pain or your pain gets worse. Do not begin these exercises until told by your health care provider. Stretching and range-of-motion exercises These exercises warm up your muscles and joints and improve the movement and flexibility of your back. These exercises also help to relieve pain, numbness, and tingling. Lumbar rotation  1. Lie on your back on a firm surface and bend your knees. 2. Straighten your arms out to your sides so each arm forms a 90-degree angle  (right angle) with a side of your body. 3. Slowly move (rotate) both of your knees to one side of your body until you feel a stretch in your lower back (lumbar). Try not to let your shoulders lift off the floor. 4. Hold this position for __________ seconds. 5. Tense your abdominal muscles and slowly move your knees back to the starting position. 6. Repeat this exercise on the other side of your body. Repeat __________ times. Complete this exercise __________ times a day. Single knee to chest  1. Lie on your back on a firm surface with both legs straight. 2. Bend one of your knees. Use your hands to move your knee up toward your chest until you feel a gentle stretch in your lower back and buttock. ? Hold your leg in this position by holding on to the front of your knee. ? Keep your other leg as straight as possible. 3. Hold this position for __________ seconds. 4. Slowly return to the starting position. 5. Repeat with your other leg. Repeat __________ times. Complete this exercise __________ times a day. Prone extension on elbows  1. Lie on your abdomen on a firm surface (prone position). 2. Prop yourself up on your elbows. 3. Use your arms to help lift your chest up until you feel a gentle stretch in your abdomen and your lower back. ? This will place some of your body weight on your elbows. If  this is uncomfortable, try stacking pillows under your chest. ? Your hips should stay down, against the surface that you are lying on. Keep your hip and back muscles relaxed. 4. Hold this position for __________ seconds. 5. Slowly relax your upper body and return to the starting position. Repeat __________ times. Complete this exercise __________ times a day. Strengthening exercises These exercises build strength and endurance in your back. Endurance is the ability to use your muscles for a long time, even after they get tired. Pelvic tilt This exercise strengthens the muscles that lie deep in the  abdomen. 1. Lie on your back on a firm surface. Bend your knees and keep your feet flat on the floor. 2. Tense your abdominal muscles. Tip your pelvis up toward the ceiling and flatten your lower back into the floor. ? To help with this exercise, you may place a small towel under your lower back and try to push your back into the towel. 3. Hold this position for __________ seconds. 4. Let your muscles relax completely before you repeat this exercise. Repeat __________ times. Complete this exercise __________ times a day. Alternating arm and leg raises  1. Get on your hands and knees on a firm surface. If you are on a hard floor, you may want to use padding, such as an exercise mat, to cushion your knees. 2. Line up your arms and legs. Your hands should be directly below your shoulders, and your knees should be directly below your hips. 3. Lift your left leg behind you. At the same time, raise your right arm and straighten it in front of you. ? Do not lift your leg higher than your hip. ? Do not lift your arm higher than your shoulder. ? Keep your abdominal and back muscles tight. ? Keep your hips facing the ground. ? Do not arch your back. ? Keep your balance carefully, and do not hold your breath. 4. Hold this position for __________ seconds. 5. Slowly return to the starting position. 6. Repeat with your right leg and your left arm. Repeat __________ times. Complete this exercise __________ times a day. Abdominal set with straight leg raise  1. Lie on your back on a firm surface. 2. Bend one of your knees and keep your other leg straight. 3. Tense your abdominal muscles and lift your straight leg up, 4-6 inches (10-15 cm) off the ground. 4. Keep your abdominal muscles tight and hold this position for __________ seconds. ? Do not hold your breath. ? Do not arch your back. Keep it flat against the ground. 5. Keep your abdominal muscles tense as you slowly lower your leg back to the  starting position. 6. Repeat with your other leg. Repeat __________ times. Complete this exercise __________ times a day. Single leg lower with bent knees 1. Lie on your back on a firm surface. 2. Tense your abdominal muscles and lift your feet off the floor, one foot at a time, so your knees and hips are bent in 90-degree angles (right angles). ? Your knees should be over your hips and your lower legs should be parallel to the floor. 3. Keeping your abdominal muscles tense and your knee bent, slowly lower one of your legs so your toe touches the ground. 4. Lift your leg back up to return to the starting position. ? Do not hold your breath. ? Do not let your back arch. Keep your back flat against the ground. 5. Repeat with your other leg. Repeat __________  times. Complete this exercise __________ times a day. Posture and body mechanics Good posture and healthy body mechanics can help to relieve stress in your body's tissues and joints. Body mechanics refers to the movements and positions of your body while you do your daily activities. Posture is part of body mechanics. Good posture means:  Your spine is in its natural S-curve position (neutral).  Your shoulders are pulled back slightly.  Your head is not tipped forward. Follow these guidelines to improve your posture and body mechanics in your everyday activities. Standing   When standing, keep your spine neutral and your feet about hip width apart. Keep a slight bend in your knees. Your ears, shoulders, and hips should line up.  When you do a task in which you stand in one place for a long time, place one foot up on a stable object that is 2-4 inches (5-10 cm) high, such as a footstool. This helps keep your spine neutral. Sitting   When sitting, keep your spine neutral and keep your feet flat on the floor. Use a footrest, if necessary, and keep your thighs parallel to the floor. Avoid rounding your shoulders, and avoid tilting your  head forward.  When working at a desk or a computer, keep your desk at a height where your hands are slightly lower than your elbows. Slide your chair under your desk so you are close enough to maintain good posture.  When working at a computer, place your monitor at a height where you are looking straight ahead and you do not have to tilt your head forward or downward to look at the screen. Resting  When lying down and resting, avoid positions that are most painful for you.  If you have pain with activities such as sitting, bending, stooping, or squatting, lie in a position in which your body does not bend very much. For example, avoid curling up on your side with your arms and knees near your chest (fetal position).  If you have pain with activities such as standing for a long time or reaching with your arms, lie with your spine in a neutral position and bend your knees slightly. Try the following positions: ? Lying on your side with a pillow between your knees. ? Lying on your back with a pillow under your knees. Lifting   When lifting objects, keep your feet at least shoulder width apart and tighten your abdominal muscles.  Bend your knees and hips and keep your spine neutral. It is important to lift using the strength of your legs, not your back. Do not lock your knees straight out.  Always ask for help to lift heavy or awkward objects. This information is not intended to replace advice given to you by your health care provider. Make sure you discuss any questions you have with your health care provider. Document Revised: 04/21/2018 Document Reviewed: 01/19/2018 Elsevier Patient Education  [place] Tunnel Syndrome  Guyon tunnel syndrome, also known as cyclist's palsy, is a disorder that causes pain, weakness, and numbness in the wrist and the hand. Pain occurs in the outer (ulnar) part of the wrist and the palm of the hand, including the ring finger and pinkie.  This condition happens when a nerve in the arm and hand (ulnar nerve) is stretched or squeezed (compressed) at the base of the hand. Over time, you may start to have symptoms with just a small amount of stress to your hand or wrist. When  it takes less stress to cause symptoms than it used to, this is called sensitization. What are the causes? This condition may be caused by:  Repetitive pressure on the hands and wrists.  An injury to the base of the hand that causes swelling or a break (fracture) in a wrist bone. Guyon tunnel syndrome is common among cyclists because gripping and leaning on bicycle handlebars for long periods of time can cause this condition and make it worse over time. What increases the risk? The following factors may make you more likely to develop this condition:  Having diabetes.  Having hypothyroidism.  Participating in activities that involve repeated impact, pressure, or vibration of the hands or wrists. These include certain sports, such as cycling, tennis, and martial arts.  Repeatedly bearing weight in your hands, such as when you use crutches.  Having gout or rheumatoid arthritis.  Having a ganglion cyst or lipoma near the base of the hand.  Having carpal tunnel syndrome. What are the signs or symptoms? Symptoms of this condition may include:  Tingling, numbness, or a burning feeling in the ulnar side of the palm and in the ring finger and pinkie.  Pain in the hand or wrist. Pain may feel sharp, or it may feel like an ache.  Weakness in the hand, which may cause difficulty gripping objects.  Involuntary bending of the ring finger and pinkie toward the palm (claw hand). How is this diagnosed? This condition may be diagnosed based on:  A physical exam.  Your medical history.  Tests, such as: ? Nerve conduction test. This test checks the quality of signals along your ulnar nerve. ? X-rays. ? A CT scan. ? An ultrasound. This uses sound waves to  make an image of your affected area. How is this treated? Treatment for this condition may include:  Resting the injured area. This may include stopping or modifying any sports and physical activity for a period of time.  Icing the injured area.  Taking medicines that help to relieve pain and inflammation.  Wearing a splint to keep your wrist and hand in the proper position.  Having physical therapy.  Having an injection of medicine (cortisone) that helps to reduce inflammation.  Having surgery to relieve pressure on the ulnar nerve. This is done only in very severe cases. Follow these instructions at home: If you have a splint:  Do not put pressure on any part of the splint until it is fully hardened. This may take several hours.  Wear the splint as told by your health care provider. Remove it only as told by your health care provider.  Loosen the splint if your fingers tingle, become numb, or turn cold and blue.  Keep the splint clean.  If the splint is not waterproof: ? Do not let it get wet. ? Cover it with a watertight covering when you take a bath or shower.  Ask your health care provider when it is safe for you to drive if you have a splint on your hand. Managing pain, stiffness, and swelling   If directed, put ice on the injured area. ? Put ice in a plastic bag. ? Place a towel between your skin and the bag. ? Leave the ice on for 20 minutes, 2-3 times a day. Activity  Return to your normal activities as told by your health care provider. Ask your health care provider what activities are safe for you.  When you do activities that cause stress to your hands  and wrists, try wearing padded gloves. Change your hand positions often, especially when you do these activities for a long time.  If physical therapy was prescribed, do exercises as told by your health care provider. General instructions  Take over-the-counter and prescription medicines only as told by your  health care provider.  Keep all follow-up visits as told by your health care provider. This is important. Contact a health care provider if:  You have pain, tingling, or weakness that gets worse.  Your symptoms do not improve after 2 weeks of treatment. Summary  Guyon tunnel syndrome, also known as cyclist's palsy, is a disorder that causes pain, weakness, and numbness in the wrist and the hand.  This condition is common among cyclists because gripping and leaning on bicycle handlebars for long periods of time can cause this condition and make it worse over time.  This condition is treated with rest, ice, medicines, physical therapy, and surgery as needed. You may also need to wear a splint. This information is not intended to replace advice given to you by your health care provider. Make sure you discuss any questions you have with your health care provider. Document Revised: 11/10/2017 Document Reviewed: 11/10/2017 Elsevier Patient Education  2020 [employer].

## 2019-10-09 NOTE — Progress Notes (Signed)
   SUBJECTIVE:   CHIEF COMPLAINT / HPI: kidney issues Patient declines an interpretor during this encounter.   Back pain- bilateral mid back present since Friday. Saw nephrologist on Thursday with no complaints at that time. She woke up with the pain. Progressively worse. No redness or swelling at back and no known injury. Thinks she may have slept wrong but worried it's her kidneys. Denies fever, rash, dysuria. Not currently on menstrual cycle.   Wrist/hand pain- bilateral 4th and 5th digit burning pain which is present at night and interferes with sleep. Denies any pain in her proximal arms/shoulders/neck. Helped with shaking out hands. Has not tried any PO medications due to her kidney dysfunction.  Kidney problems- CKD4. Recently saw nephrology  OBJECTIVE:   BP 100/80   Pulse 81   Ht 5\' 3"  (1.6 m)   Wt 188 lb 12.8 oz (85.6 kg)   SpO2 100%   BMI 33.44 kg/m   General: NAD, pleasant, able to participate in exam Back: negative CVA tenderness bilaterally. Tender to palpation of latissimus dorsi Hands: Observation: negative atrophy or edema Palpation: negative tenderness to palpation of 4th/5th digit, metacarpals or wrist ROM: intact Strength: symmetrical and normal Sensation: intact Special tests: phalen produced the same symptoms in ulnar deviation Skin: warm and dry, no rashes noted Neuro: alert and oriented, no focal deficits Psych: Normal affect and mood  ASSESSMENT/PLAN:   Back pain Negative for new urinary symptoms, fever, LE weakness, or redness. Low suspicion that it's related to kidneys but will check urinalysis and RFP. Tenderness to palpation of latissimus dorsi would indicate muscular strain/spasm. Medications limited due to kidney function.  - provided with back strengthening exercises - continue home muscle relaxer as needed - f/u kidney w/u  Guyon syndrome, unspecified laterality Bilateral. History and physical is very specific to ulnar distribution  nerve-like pain. precepted with sports med fellow.  - Will check electrolytes and anemia panel to rule out contribution but suspect nerve entrapment at guyons canal. No proximal symptoms but would evaluate for impingement proximal if not improved with treatment. - gabapentin 300mg  QHS, discussed sedation side effects with pt. Recheck RFP in 2 weeks - RFP, anemia panel - recommended cock-up wrist braces to patient and described nighttime use. No braces in stock at clinic.  - refer to sports med if not improving  CKD (chronic kidney disease) stage 4, GFR 15-29 ml/min ([place]) - continue to follow with nephrology.  - urinalysis and RFP today    [place]

## 2019-10-10 DIAGNOSIS — G5620 Lesion of ulnar nerve, unspecified upper limb: Secondary | ICD-10-CM | POA: Insufficient documentation

## 2019-10-10 DIAGNOSIS — M549 Dorsalgia, unspecified: Secondary | ICD-10-CM

## 2019-10-10 DIAGNOSIS — N184 Chronic kidney disease, stage 4 (severe): Secondary | ICD-10-CM | POA: Insufficient documentation

## 2019-10-10 HISTORY — DX: Dorsalgia, unspecified: M54.9

## 2019-10-10 LAB — RENAL FUNCTION PANEL
Albumin: 4 g/dL (ref 3.8–4.8)
BUN/Creatinine Ratio: 19 (ref 9–23)
BUN: 40 mg/dL — ABNORMAL HIGH (ref 6–24)
CO2: 18 mmol/L — ABNORMAL LOW (ref 20–29)
Calcium: 8.7 mg/dL (ref 8.7–10.2)
Chloride: 102 mmol/L (ref 96–106)
Creatinine, Ser: 2.15 mg/dL — ABNORMAL HIGH (ref 0.57–1.00)
GFR calc Af Amer: 32 mL/min/{1.73_m2} — ABNORMAL LOW (ref >59)
GFR calc non Af Amer: 28 mL/min/{1.73_m2} — ABNORMAL LOW (ref >59)
Glucose: 82 mg/dL (ref 65–99)
Phosphorus: 3.4 mg/dL (ref 3.0–4.3)
Potassium: 4.6 mmol/L (ref 3.5–5.2)
Sodium: 135 mmol/L (ref 134–144)

## 2019-10-10 LAB — ANEMIA PANEL
Ferritin: 94 ng/mL (ref 15–150)
Folate, Hemolysate: 459 ng/mL
Folate, RBC: 1275 ng/mL (ref >498)
Hematocrit: 36 % (ref 34.0–46.6)
Iron Saturation: 15 % (ref 15–55)
Iron: 39 ug/dL (ref 27–159)
Retic Ct Pct: 1.9 % (ref 0.6–2.6)
Total Iron Binding Capacity: 266 ug/dL (ref 250–450)
UIBC: 227 ug/dL (ref 131–425)
Vitamin B-12: 324 pg/mL (ref 232–1245)

## 2019-10-10 NOTE — Assessment & Plan Note (Signed)
Negative for new urinary symptoms, fever, LE weakness, or redness. Low suspicion that it's related to kidneys but will check urinalysis and RFP. Tenderness to palpation of latissimus dorsi would indicate muscular strain/spasm. Medications limited due to kidney function.  - provided with back strengthening exercises - continue home muscle relaxer as needed - f/u kidney w/u

## 2019-10-10 NOTE — Assessment & Plan Note (Signed)
-   continue to follow with nephrology.  - urinalysis and RFP today

## 2019-10-10 NOTE — Assessment & Plan Note (Signed)
Bilateral. History and physical is very specific to ulnar distribution nerve-like pain. precepted with sports med fellow.  - Will check electrolytes and anemia panel to rule out contribution but suspect nerve entrapment at guyons canal. No proximal symptoms but would evaluate for impingement proximal if not improved with treatment. - gabapentin 300mg  QHS, discussed sedation side effects with pt. Recheck RFP in 2 weeks - RFP, anemia panel - recommended cock-up wrist braces to patient and described nighttime use. No braces in stock at clinic.  - refer to sports med if not improving

## 2019-11-02 DIAGNOSIS — R053 Chronic cough: Secondary | ICD-10-CM | POA: Diagnosis not present

## 2019-11-02 DIAGNOSIS — R252 Cramp and spasm: Secondary | ICD-10-CM | POA: Diagnosis not present

## 2019-11-02 DIAGNOSIS — I129 Hypertensive chronic kidney disease with stage 1 through stage 4 chronic kidney disease, or unspecified chronic kidney disease: Secondary | ICD-10-CM | POA: Diagnosis not present

## 2019-11-02 DIAGNOSIS — N184 Chronic kidney disease, stage 4 (severe): Secondary | ICD-10-CM | POA: Diagnosis not present

## 2019-11-03 ENCOUNTER — Other Ambulatory Visit: Payer: Self-pay | Admitting: Family Medicine

## 2019-11-03 DIAGNOSIS — I10 Essential (primary) hypertension: Secondary | ICD-10-CM

## 2019-11-05 DIAGNOSIS — R300 Dysuria: Secondary | ICD-10-CM | POA: Diagnosis not present

## 2019-11-05 DIAGNOSIS — N10 Acute tubulo-interstitial nephritis: Secondary | ICD-10-CM | POA: Diagnosis not present

## 2019-11-05 DIAGNOSIS — K5730 Diverticulosis of large intestine without perforation or abscess without bleeding: Secondary | ICD-10-CM | POA: Diagnosis not present

## 2019-11-05 DIAGNOSIS — R109 Unspecified abdominal pain: Secondary | ICD-10-CM | POA: Diagnosis not present

## 2019-11-05 DIAGNOSIS — R1032 Left lower quadrant pain: Secondary | ICD-10-CM | POA: Diagnosis not present

## 2019-11-08 ENCOUNTER — Encounter: Payer: Self-pay | Admitting: Family Medicine

## 2019-11-08 ENCOUNTER — Ambulatory Visit (INDEPENDENT_AMBULATORY_CARE_PROVIDER_SITE_OTHER): Payer: Medicaid Other | Admitting: Family Medicine

## 2019-11-08 ENCOUNTER — Other Ambulatory Visit: Payer: Self-pay

## 2019-11-08 VITALS — BP 122/80 | HR 100 | Temp 98.9°F | Ht 63.0 in | Wt 192.4 lb

## 2019-11-08 DIAGNOSIS — I10 Essential (primary) hypertension: Secondary | ICD-10-CM

## 2019-11-08 DIAGNOSIS — R053 Chronic cough: Secondary | ICD-10-CM

## 2019-11-08 DIAGNOSIS — M546 Pain in thoracic spine: Secondary | ICD-10-CM | POA: Diagnosis not present

## 2019-11-08 DIAGNOSIS — E785 Hyperlipidemia, unspecified: Secondary | ICD-10-CM | POA: Diagnosis not present

## 2019-11-08 DIAGNOSIS — N12 Tubulo-interstitial nephritis, not specified as acute or chronic: Secondary | ICD-10-CM

## 2019-11-08 HISTORY — DX: Tubulo-interstitial nephritis, not specified as acute or chronic: N12

## 2019-11-08 NOTE — Assessment & Plan Note (Addendum)
Improving, clinical diagnosis in ED on 10/25. CT ab/pelvis without inflammation or renal stone. Fortunately most of her urinary symptoms have improved, however with continued suprapubic pressure and CVA tenderness.  Do question if back pain also has MSK involvement as well.  Will continue cefdinir BID as is for now and have close follow-up preferably on Monday (11/1) to ensure she continues to improve. If almost resolved could discontinue cefdinir course at that time (rather than 10 days).

## 2019-11-08 NOTE — Assessment & Plan Note (Addendum)
Likely MSK involvement as well in addition to above.  Encouraged heat 20 minutes at a time several times a day with back strengthening/stretching exercises.  May use Tylenol and opioid given in ED as needed.

## 2019-11-08 NOTE — Assessment & Plan Note (Signed)
Well-controlled.  Will continue Lopressor 25 twice daily and Norvasc 5 mg as is.

## 2019-11-08 NOTE — Assessment & Plan Note (Signed)
Reports her nephrologist discontinued her atorvastatin for the next 2 weeks due to myalgias, which has improved her muscle soreness.  Will try to attempt Crestor in the future at a reduced dose.

## 2019-11-08 NOTE — Patient Instructions (Signed)
It was a wonderful to see you today.  Please continue to make sure that you are drinking plenty of fluids and you can use Tylenol up to 3000 mg a day for discomfort.  Keep continue to take your antibiotics.  I would like you to follow-up on Monday to see how you are doing, if everything is doing well we may build to stop your antibiotics early.  For your back pain, I think this might be a combination of muscles and kidneys.  You can try heat on your back for 20 minutes at a time several times a day.  Try to do stretches as well.

## 2019-11-08 NOTE — Assessment & Plan Note (Signed)
Persistent.  Currently treating as possible allergic/GERD etiology, will maintain therapy as is.  Previously attempted Symbicort with no improvement, however has not had formal PFTs in the past.  Awaiting pulmonology initial appointment on 11/5.

## 2019-11-08 NOTE — Progress Notes (Signed)
° ° °  SUBJECTIVE:   CHIEF COMPLAINT / HPI: Follow-up ED visit for pyelonephritis  Ms. Zoe Woodard is a 39 year old female presenting for follow-up of recent ED visit for pyelonephritis.  She reported mild dysuria associate with urinary frequency/urgency, suprapubic pressure, and pain radiating to her bilateral back for the few days prior to her visit.  U/a noted to be consistent with infection (however several epi), with normal CT abdomen not showing any kidney stone or perinephric inflammation.  Rx'd cefdinir for 10 days.  Today, she reports that she is doing better comparatively, but not back to baseline.  She no longer is having any urinary frequency or urgency with less suprapubic pain/pressure.  She still having discomfort in her bilateral middle back around CVA area.  She also reports that her temperature has been elevated for the past few days with the highest being 19F.  Occasional chills.  Taking cefdinir as prescribed.  She still has a chronic cough.  She has an appointment to establish care with pulmonology next week.  PERTINENT  PMH / PSH: CKD stage IV, hypertension, chronic cough  OBJECTIVE:   BP 122/80    Pulse 100    Temp 98.9 F (37.2 C) (Oral)    Ht 5\' 3"  (1.6 m)    Wt 192 lb 6.4 oz (87.3 kg)    SpO2 98%    BMI 34.08 kg/m   General: Alert, NAD HEENT: NCAT, MMM Cardiac: RRR no m/g/r Lungs: Clear bilaterally, no increased WOB, occasional nonproductive cough during exam Abdomen: soft, some tenderness in the suprapubic region without any rebounding or guarding, nondistended, normoactive bowel sounds.  Mild bilateral CVA tenderness. Msk: Moves all extremities spontaneously, also has some parathoracic spinal musculature tightness bilaterally Ext: Warm, dry, 2+ distal pulses, no edema   ASSESSMENT/PLAN:   Pyelonephritis Improving, clinical diagnosis in ED on 10/25. CT ab/pelvis without inflammation or renal stone. Fortunately most of her urinary symptoms have improved, however with  continued suprapubic pressure and CVA tenderness.  Do question if back pain also has MSK involvement as well.  Will continue cefdinir BID as is for now and have close follow-up preferably on Monday (11/1) to ensure she continues to improve. If almost resolved could discontinue cefdinir course at that time (rather than 10 days).  Back pain Likely MSK involvement as well in addition to above.  Encouraged heat 20 minutes at a time several times a day with back strengthening/stretching exercises.  May use Tylenol and opioid given in ED as needed.  Chronic cough Persistent.  Currently treating as possible allergic/GERD etiology, will maintain therapy as is.  Previously attempted Symbicort with no improvement, however has not had formal PFTs in the past.  Awaiting pulmonology initial appointment on 11/5.   Essential hypertension Well-controlled.  Will continue Lopressor 25 twice daily and Norvasc 5 mg as is.  Hyperlipidemia Reports her nephrologist discontinued her atorvastatin for the next 2 weeks due to myalgias, which has improved her muscle soreness.  Will try to attempt Crestor in the future at a reduced dose.     Follow-up on Monday next week, or sooner if needed especially if fever over 100.52F, worsening back pain, severe fatigue, or recurrence of urinary symptoms.  Otherwise afterwards can follow-up with myself in the next 3 months.   Zoe Woodard, [place]

## 2019-11-16 ENCOUNTER — Ambulatory Visit: Payer: Medicaid Other | Admitting: Pulmonary Disease

## 2019-11-16 ENCOUNTER — Encounter: Payer: Self-pay | Admitting: Pulmonary Disease

## 2019-11-16 ENCOUNTER — Other Ambulatory Visit: Payer: Self-pay

## 2019-11-16 VITALS — BP 118/78 | HR 84 | Temp 97.1°F | Ht 63.0 in | Wt 192.2 lb

## 2019-11-16 DIAGNOSIS — K219 Gastro-esophageal reflux disease without esophagitis: Secondary | ICD-10-CM | POA: Diagnosis not present

## 2019-11-16 DIAGNOSIS — Z9109 Other allergy status, other than to drugs and biological substances: Secondary | ICD-10-CM | POA: Diagnosis not present

## 2019-11-16 DIAGNOSIS — R059 Cough, unspecified: Secondary | ICD-10-CM | POA: Diagnosis not present

## 2019-11-16 DIAGNOSIS — R911 Solitary pulmonary nodule: Secondary | ICD-10-CM

## 2019-11-16 DIAGNOSIS — Z789 Other specified health status: Secondary | ICD-10-CM

## 2019-11-16 NOTE — Patient Instructions (Addendum)
Thank you for visiting Dr. Valeta Harms at [facility]. Today we recommend the following:  No need for lung nodule follow up. Considered low risk at 91mm in size and non-smoker.  Continue current allergy regimen for cough  If still not improving would consider adding OTC heartburn medication like omeprazole.   Return if symptoms worsen or fail to improve.    Please do your part to reduce the spread of COVID-19.

## 2019-11-16 NOTE — Progress Notes (Signed)
Synopsis: Referred in November 2021 for 4 mm left upper lobe nodule by Martyn Malay, MD  Subjective:   PATIENT ID: Zoe Woodard GENDER: female DOB: 08/29/1980, MRN: [id number]  Chief Complaint  Patient presents with  . Consult    lung nodule/cough    PMH lifelong non-smoker, history of hypertension obesity.  She had a recent hospitalization and during this found an incidental nodule on CT scan imaging.  Patient also has a chronic cough as described below.  Cough This is a chronic problem. The current episode started more than 1 month ago. The problem has been waxing and waning. The problem occurs every few hours. The cough is productive of sputum (whitish ). Pertinent negatives include no chest pain, chills, fever, headaches, heartburn, hemoptysis, myalgias, nasal congestion, postnasal drip, rash, rhinorrhea, sore throat, shortness of breath, sweats, weight loss or wheezing. The symptoms are aggravated by other (heat ). She has tried a beta-agonist inhaler for the symptoms. The treatment provided no relief. There is no history of asthma, COPD, emphysema, environmental allergies or pneumonia.     Past Medical History:  Diagnosis Date  . Hypertensive urgency 07/22/2019  . Hypocalcemia 08/22/2019  . Obesity      Family History  Problem Relation Age of Onset  . Diabetes Mother   . Healthy Father      Past Surgical History:  Procedure Laterality Date  . ABDOMINAL HYSTERECTOMY    . SPINE SURGERY    . TUBAL LIGATION      Social History   Socioeconomic History  . Marital status: Single    Spouse name: Not on file  . Number of children: Not on file  . Years of education: Not on file  . Highest education level: Not on file  Occupational History  . Not on file  Tobacco Use  . Smoking status: Never Smoker  . Smokeless tobacco: Never Used  Vaping Use  . Vaping Use: Never used  Substance and Sexual Activity  . Alcohol use: No  . Drug use: No  . Sexual activity: Not on  file  Other Topics Concern  . Not on file  Social History Narrative   Lives with 3 children    Not working currently   Not sexually active.    Social Determinants of Health   Financial Resource Strain:   . Difficulty of Paying Living Expenses: Not on file  Food Insecurity:   . Worried About Charity fundraiser in the Last Year: Not on file  . Ran Out of Food in the Last Year: Not on file  Transportation Needs:   . Lack of Transportation (Medical): Not on file  . Lack of Transportation (Non-Medical): Not on file  Physical Activity:   . Days of Exercise per Week: Not on file  . Minutes of Exercise per Session: Not on file  Stress:   . Feeling of Stress : Not on file  Social Connections:   . Frequency of Communication with Friends and Family: Not on file  . Frequency of Social Gatherings with Friends and Family: Not on file  . Attends Religious Services: Not on file  . Active Member of Clubs or Organizations: Not on file  . Attends Archivist Meetings: Not on file  . Marital Status: Not on file  Intimate Partner Violence:   . Fear of Current or Ex-Partner: Not on file  . Emotionally Abused: Not on file  . Physically Abused: Not on file  . Sexually Abused: Not  on file     No Known Allergies   Outpatient Medications Prior to Visit  Medication Sig Dispense Refill  . amLODipine (NORVASC) 10 MG tablet Take 1 tablet (10 mg total) by mouth at bedtime. 30 tablet 3  . famotidine (PEPCID) 20 MG tablet Take 1 tablet (20 mg total) by mouth daily. 30 tablet 3  . fluticasone (FLONASE) 50 MCG/ACT nasal spray Place 2 sprays into both nostrils daily. 16 g 1  . HYDROcodone-acetaminophen (NORCO/VICODIN) 5-325 MG tablet Take by mouth.    . loratadine (CLARITIN) 10 MG tablet Take 1 tablet (10 mg total) by mouth daily. 30 tablet 11  . metoprolol tartrate (LOPRESSOR) 25 MG tablet Take 1 tablet (25 mg total) by mouth 2 (two) times daily. 60 tablet 1  . montelukast (SINGULAIR) 10 MG  tablet Take 1 tablet (10 mg total) by mouth at bedtime. 30 tablet 3  . tiZANidine (ZANAFLEX) 4 MG tablet Take 4 mg by mouth every 6 (six) hours as needed.    Marland Kitchen atorvastatin (LIPITOR) 40 MG tablet Take 1 tablet (40 mg total) by mouth at bedtime. (Patient not taking: Reported on 11/16/2019) 90 tablet 2  . gabapentin (NEURONTIN) 300 MG capsule Take 1 capsule (300 mg total) by mouth at bedtime. (Patient not taking: Reported on 11/16/2019) 60 capsule 0   No facility-administered medications prior to visit.    Review of Systems  Constitutional: Negative for chills, fever, malaise/fatigue and weight loss.  HENT: Negative for hearing loss, postnasal drip, rhinorrhea, sore throat and tinnitus.   Eyes: Negative for blurred vision and double vision.  Respiratory: Positive for cough. Negative for hemoptysis, sputum production, shortness of breath, wheezing and stridor.   Cardiovascular: Negative for chest pain, palpitations, orthopnea, leg swelling and PND.  Gastrointestinal: Negative for abdominal pain, constipation, diarrhea, heartburn, nausea and vomiting.  Genitourinary: Negative for dysuria, hematuria and urgency.  Musculoskeletal: Negative for joint pain and myalgias.  Skin: Negative for itching and rash.  Neurological: Negative for dizziness, tingling, weakness and headaches.  Endo/Heme/Allergies: Negative for environmental allergies. Does not bruise/bleed easily.  Psychiatric/Behavioral: Negative for depression. The patient is not nervous/anxious and does not have insomnia.   All other systems reviewed and are negative.    Objective:  Physical Exam Vitals reviewed.  Constitutional:      General: She is not in acute distress.    Appearance: She is well-developed. She is obese.  HENT:     Head: Normocephalic and atraumatic.  Eyes:     General: No scleral icterus.    Conjunctiva/sclera: Conjunctivae normal.     Pupils: Pupils are equal, round, and reactive to light.  Neck:     Vascular:  No JVD.     Trachea: No tracheal deviation.  Cardiovascular:     Rate and Rhythm: Normal rate and regular rhythm.     Heart sounds: Normal heart sounds. No murmur heard.   Pulmonary:     Effort: Pulmonary effort is normal. No tachypnea, accessory muscle usage or respiratory distress.     Breath sounds: Normal breath sounds. No stridor. No wheezing, rhonchi or rales.  Abdominal:     General: Bowel sounds are normal. There is no distension.     Palpations: Abdomen is soft.     Tenderness: There is no abdominal tenderness.  Musculoskeletal:        General: No tenderness.     Cervical back: Neck supple.  Lymphadenopathy:     Cervical: No cervical adenopathy.  Skin:    General:  Skin is warm and dry.     Capillary Refill: Capillary refill takes less than 2 seconds.     Findings: No rash.  Neurological:     Mental Status: She is alert and oriented to person, place, and time.  Psychiatric:        Behavior: Behavior normal.      Vitals:   11/16/19 1132  BP: 118/78  Pulse: 84  Temp: (!) 97.1 F (36.2 C)  TempSrc: Tympanic  SpO2: 96%  Weight: 192 lb 4 oz (87.2 kg)  Height: 5\' 3"  (1.6 m)   96% on RA BMI Readings from Last 3 Encounters:  11/16/19 34.06 kg/m  11/08/19 34.08 kg/m  10/09/19 33.44 kg/m   Wt Readings from Last 3 Encounters:  11/16/19 192 lb 4 oz (87.2 kg)  11/08/19 192 lb 6.4 oz (87.3 kg)  10/09/19 188 lb 12.8 oz (85.6 kg)     CBC    Component Value Date/Time   HCT 36.0 10/09/2019 1222     Chest Imaging: CT imaging reviewed in care everywhere that was completed in July at [facility]: 08/06/2019 CT report revealed a 4 mm lingular left upper lobe nodule.  08/15/2019 Nuclear medicine lung perfusion No evidence of PE.  Pulmonary Functions Testing Results: No flowsheet data found.  FeNO:   Pathology:   Echocardiogram:   Heart Catheterization:     Assessment & Plan:     ICD-10-CM   1. Nodule of upper lobe of left lung  R91.1   2. Cough   R05.9   3. Gastroesophageal reflux disease without esophagitis  K21.9   4. Environmental allergies  Z91.09   5. Non-smoker  Z78.9     Discussion: This is a 39 year old female, low risk upper lobe pulmonary nodule, subcentimeter in size.  Also history of chronic cough related to reflux environmental allergies.  She is a non-smoker.  Plan: Recommend no additional follow-up for her left upper lobe pulmonary nodule at 4 mm in size. Reflux symptoms currently managed with an H2 blocker.  If need increase in symptom management would consider adding a PPI such as omeprazole. Continue her regular allergy regimen that she is currently on.  Dedicated time spent today reviewing documentation in epic care everywhere and results reviews from recent imaging at [facility].   Current Outpatient Medications:  .  amLODipine (NORVASC) 10 MG tablet, Take 1 tablet (10 mg total) by mouth at bedtime., Disp: 30 tablet, Rfl: 3 .  famotidine (PEPCID) 20 MG tablet, Take 1 tablet (20 mg total) by mouth daily., Disp: 30 tablet, Rfl: 3 .  fluticasone (FLONASE) 50 MCG/ACT nasal spray, Place 2 sprays into both nostrils daily., Disp: 16 g, Rfl: 1 .  HYDROcodone-acetaminophen (NORCO/VICODIN) 5-325 MG tablet, Take by mouth., Disp: , Rfl:  .  loratadine (CLARITIN) 10 MG tablet, Take 1 tablet (10 mg total) by mouth daily., Disp: 30 tablet, Rfl: 11 .  metoprolol tartrate (LOPRESSOR) 25 MG tablet, Take 1 tablet (25 mg total) by mouth 2 (two) times daily., Disp: 60 tablet, Rfl: 1 .  montelukast (SINGULAIR) 10 MG tablet, Take 1 tablet (10 mg total) by mouth at bedtime., Disp: 30 tablet, Rfl: 3 .  tiZANidine (ZANAFLEX) 4 MG tablet, Take 4 mg by mouth every 6 (six) hours as needed., Disp: , Rfl:  .  atorvastatin (LIPITOR) 40 MG tablet, Take 1 tablet (40 mg total) by mouth at bedtime. (Patient not taking: Reported on 11/16/2019), Disp: 90 tablet, Rfl: 2   Garner Nash,  DO [facility] 11/16/2019  11:43 AM

## 2019-11-20 ENCOUNTER — Other Ambulatory Visit: Payer: Self-pay

## 2019-11-20 ENCOUNTER — Ambulatory Visit (INDEPENDENT_AMBULATORY_CARE_PROVIDER_SITE_OTHER): Payer: Medicaid Other | Admitting: Family Medicine

## 2019-11-20 ENCOUNTER — Encounter: Payer: Self-pay | Admitting: Family Medicine

## 2019-11-20 VITALS — BP 110/78 | HR 84 | Wt 191.8 lb

## 2019-11-20 DIAGNOSIS — E785 Hyperlipidemia, unspecified: Secondary | ICD-10-CM

## 2019-11-20 DIAGNOSIS — I10 Essential (primary) hypertension: Secondary | ICD-10-CM | POA: Diagnosis not present

## 2019-11-20 DIAGNOSIS — N12 Tubulo-interstitial nephritis, not specified as acute or chronic: Secondary | ICD-10-CM

## 2019-11-20 DIAGNOSIS — R053 Chronic cough: Secondary | ICD-10-CM | POA: Diagnosis not present

## 2019-11-20 DIAGNOSIS — N184 Chronic kidney disease, stage 4 (severe): Secondary | ICD-10-CM | POA: Diagnosis not present

## 2019-11-20 MED ORDER — ROSUVASTATIN CALCIUM 10 MG PO TABS: 10.0000 mg | ORAL_TABLET | Freq: Every day | ORAL | 0 refills | Status: DC

## 2019-11-20 MED ORDER — FAMOTIDINE 20 MG PO TABS: 20.0000 mg | ORAL_TABLET | Freq: Two times a day (BID) | ORAL | 3 refills | Status: DC

## 2019-11-20 NOTE — Progress Notes (Signed)
    SUBJECTIVE:   CHIEF COMPLAINT / HPI: Fu kidney infection   Ms. Zoe Woodard is a 39 year old female presenting for follow-up of her recent complicated UTI/pyelonephritis. She completed a 10-day course of cefdinir.  Today she reports she is doing wonderful.  Having no further urinary symptoms, back pain, fever, or chills.  Back to baseline.  She also reports she had her initial appointment with the pulmonologist last week which went well.  Recommended no further follow-up for pulmonary nodule and recommended possibly increasing reflux medication for chronic cough.  She still consistently has her cough, seems to be worse in the morning.  She would also like to discuss restarting statin therapy.  Her atorvastatin was discontinued for 2 weeks per her nephrologist due to myalgias.  Myalgias resolved after hiatus of medication.  Her last lipid panel in 07/2019 showed triglycerides in the 400s with HDL 34.  PERTINENT  PMH / PSH: CKD stage IV, hypertension, chronic cough  OBJECTIVE:   BP 110/78   Pulse 84   Wt 191 lb 12.8 oz (87 kg)   SpO2 97%   BMI 33.98 kg/m   General: Alert, NAD HEENT: NCAT, MMM Lungs: No increased WOB  Abdomen: soft, non-tender, non-distended, no CVA tenderness bilaterally Msk: Moves all extremities spontaneously  Ext: Warm, dry, 2+ distal pulses   ASSESSMENT/PLAN:   Pyelonephritis Resolved.  Patient aware of s/sx to monitor for in the future.  Essential hypertension Well-controlled.  Will continue with amlodipine 5 mg and metoprolol as is.  CKD (chronic kidney disease) stage 4, GFR 15-29 ml/min (HCC) Stable and following with nephrology.  Hyperlipidemia Moderate hypertriglyceridemia.  Will restart statin, however transition to Crestor for decreased incidence of myalgias.  Started with Crestor 5 mg and will stepwise increase to 20 mg total if tolerating well over the next several weeks.  Chronic cough Will increase Pepcid to twice daily.    Follow-up in 1  month for above or sooner if needed.   Zoe Woodard, [place]

## 2019-11-20 NOTE — Patient Instructions (Signed)
Increase pepcid to twice daily.   Start crestor: start with 1/2 tab for 1 week, if doing well increase to one tab and then if doing well with this go up to two tablets and stay there.

## 2019-11-20 NOTE — Assessment & Plan Note (Signed)
Well-controlled.  Will continue with amlodipine 5 mg and metoprolol as is.

## 2019-11-20 NOTE — Assessment & Plan Note (Signed)
Moderate hypertriglyceridemia.  Will restart statin, however transition to Crestor for decreased incidence of myalgias.  Started with Crestor 5 mg and will stepwise increase to 20 mg total if tolerating well over the next several weeks.

## 2019-11-20 NOTE — Assessment & Plan Note (Signed)
Will increase Pepcid to twice daily.

## 2019-11-20 NOTE — Assessment & Plan Note (Signed)
Resolved.  Patient aware of s/sx to monitor for in the future.

## 2019-11-20 NOTE — Assessment & Plan Note (Signed)
Stable and following with nephrology.

## 2019-11-21 ENCOUNTER — Telehealth: Payer: Self-pay | Admitting: Family Medicine

## 2019-11-21 NOTE — Telephone Encounter (Signed)
Called patient to correct Crestor dosing.  Will increase it to 10 mg maximum.  She endorsed understanding.  [name], DO

## 2019-12-03 DIAGNOSIS — R110 Nausea: Secondary | ICD-10-CM | POA: Diagnosis not present

## 2019-12-03 DIAGNOSIS — Z90710 Acquired absence of both cervix and uterus: Secondary | ICD-10-CM | POA: Diagnosis not present

## 2019-12-03 DIAGNOSIS — N83202 Unspecified ovarian cyst, left side: Secondary | ICD-10-CM | POA: Diagnosis not present

## 2019-12-03 DIAGNOSIS — K429 Umbilical hernia without obstruction or gangrene: Secondary | ICD-10-CM | POA: Diagnosis not present

## 2019-12-03 DIAGNOSIS — R1084 Generalized abdominal pain: Secondary | ICD-10-CM | POA: Diagnosis not present

## 2019-12-03 DIAGNOSIS — K5730 Diverticulosis of large intestine without perforation or abscess without bleeding: Secondary | ICD-10-CM | POA: Diagnosis not present

## 2019-12-04 DIAGNOSIS — N83202 Unspecified ovarian cyst, left side: Secondary | ICD-10-CM | POA: Diagnosis not present

## 2019-12-04 DIAGNOSIS — K5730 Diverticulosis of large intestine without perforation or abscess without bleeding: Secondary | ICD-10-CM | POA: Diagnosis not present

## 2019-12-04 DIAGNOSIS — Z90710 Acquired absence of both cervix and uterus: Secondary | ICD-10-CM | POA: Diagnosis not present

## 2019-12-04 DIAGNOSIS — K429 Umbilical hernia without obstruction or gangrene: Secondary | ICD-10-CM | POA: Diagnosis not present

## 2019-12-14 DIAGNOSIS — H5213 Myopia, bilateral: Secondary | ICD-10-CM | POA: Diagnosis not present

## 2019-12-26 ENCOUNTER — Encounter: Payer: Self-pay | Admitting: Family Medicine

## 2019-12-26 ENCOUNTER — Other Ambulatory Visit: Payer: Self-pay

## 2019-12-26 ENCOUNTER — Ambulatory Visit (INDEPENDENT_AMBULATORY_CARE_PROVIDER_SITE_OTHER): Payer: Medicaid Other | Admitting: Family Medicine

## 2019-12-26 VITALS — BP 110/80 | HR 109 | Ht 63.0 in | Wt 191.0 lb

## 2019-12-26 DIAGNOSIS — R1032 Left lower quadrant pain: Secondary | ICD-10-CM

## 2019-12-26 DIAGNOSIS — E785 Hyperlipidemia, unspecified: Secondary | ICD-10-CM | POA: Diagnosis not present

## 2019-12-26 DIAGNOSIS — R300 Dysuria: Secondary | ICD-10-CM | POA: Diagnosis not present

## 2019-12-26 DIAGNOSIS — D72829 Elevated white blood cell count, unspecified: Secondary | ICD-10-CM | POA: Diagnosis not present

## 2019-12-26 LAB — POCT URINALYSIS DIP (MANUAL ENTRY)
Bilirubin, UA: NEGATIVE
Glucose, UA: NEGATIVE mg/dL
Ketones, POC UA: NEGATIVE mg/dL
Leukocytes, UA: NEGATIVE
Nitrite, UA: NEGATIVE
Protein Ur, POC: >=300 mg/dL — AB
Spec Grav, UA: 1.015 (ref 1.010–1.025)
Urobilinogen, UA: 0.2 E.U./dL
pH, UA: 5.5 (ref 5.0–8.0)

## 2019-12-26 LAB — POCT UA - MICROSCOPIC ONLY

## 2019-12-26 MED ORDER — CEPHALEXIN 500 MG PO CAPS: 500.0000 mg | ORAL_CAPSULE | Freq: Two times a day (BID) | ORAL | 0 refills | Status: AC

## 2019-12-26 NOTE — Progress Notes (Signed)
    SUBJECTIVE:   CHIEF COMPLAINT / HPI:  F/u cholesterol medicine/abdominal pain   Zoe Woodard is a 39 yo female presenting to discuss the following:   Abdominal pain: Seen in ED (care everywhere) on 11/23 for this concern. CT abdomen without acute process, did show 5cm left ovarian cyst. Reports this is still intermittently present. First started in RLQ/groin, now over on the left side with suprapubic. Describes as cramping and pressure. She now has had urinary frequency and urgency with hesitancy in the past few days. Cr around baseline in ED visit. Tylenol helping. Did have similar pains back in 2016 due to an ovarian cyst--had right ovarian egstectomy and cystoectomy  in 2015/2016 for this and resolved. No fever, vomiting, change in BM. S/p appendectomy and hysterectomy. Not sexually active.   Cholesterol: Started Crestor on 11/9. Taking 10mg . Previously on atorvastatin but had a lot of muscle cramps. She is having some muscle cramps again but not as bad as before.   Leukocytosis: Persistently high, noted again on CBC 11/23 in the ED around 17. No known family or personal history of blood disorders. No fatigue, unexpected weight changes, significant night sweats, or unexplained fever.   CT Ab/pelvis Impression 11/23:   1. Negative for acute process.  2. Negative for obstructive uropathy or urinary calculus.  3. 5 cm left ovarian cyst, likely physiologic in etiology.  4. Appendectomy.  5. Colonic diverticulosis, without acute diverticulitis.    PERTINENT  PMH / PSH: CKD stage IV, hypertension, chronic cough  OBJECTIVE:   BP 110/80   Pulse (!) 109   Ht 5\' 3"  (1.6 m)   Wt 191 lb (86.6 kg)   SpO2 98%   BMI 33.83 kg/m   General: Alert, NAD, well appearing  HEENT: NCAT, MMM Lungs: No increased WOB  Abdomen: soft, non-distended, normoactive BS, tender to palpation around suprapubic region and LLQ without any rebounding or guarding, no CVA tenderness bilaterally.  Msk: Moves  all extremities spontaneously  Ext: Warm, dry, 2+ distal pulses, no edema b/l    ASSESSMENT/PLAN:   Hyperlipidemia Cont on Crestor for primary prevention, however due to bothersome muscle cramps will transition to every other day dosing. Check LDL on next visit.    Abdominal pain Acute, follow up from recent ED visit 11/23. CT abdomen/pelvis without acute pathology (also s/p appendectomy/hysterectomy). Unclear etiology, may be multifactorial. Obtained U/A which appeared relatively clear, however given associated urinary sx, will obtain culture and empirically treat for UTI. Rx'd keflex x5 days renally dosed. Could also consider related to 5cm ovarian cyst seen on CT, likely physiologic and should involute with time, however will monitor closely. Doubt diverticulitis given presentation. Tylenol/heating pad for discomfort.   Leukocytosis Persistent, at least dating back through 01/2018 (can not find records prior to this). Neurophilic predominance, ranging 16-24, not exclusively collected with infectious process. Associated mild anemia and intermittent thrombocytosis (upper normal, 350-420), however suspect anemia is more related to known CKD IV. Non-smoker, not on chronic steroids. Placed referral to hematology, will repeat CBC with added blood smear on f/u visit.      Follow up if abdominal pain not improving or sooner if worsening in severity or now with fever, vomiting/change in BM. Otherwise, f/u in 1 month for chronic concerns.   [name], [place]

## 2019-12-26 NOTE — Patient Instructions (Addendum)
It was wonderful to see you today.  I will send in antibiotics to your pharmacy to treat this as a urinary infection.  We are going to culture the urine to see if any bacteria grows.  You can continue to take Tylenol and use a heating pad on this area to help.  I will additionally place referral to hematologist.  You can continue to take the Crestor, if you need to try every other day to help limit your muscle aches that is perfectly fine.  We will check your cholesterol level in 1 month.  Please follow-up or go to the ED if your abdominal pain becomes more severe or if you are unable to maintain your hydration.

## 2019-12-28 ENCOUNTER — Encounter: Payer: Self-pay | Admitting: Family Medicine

## 2019-12-28 DIAGNOSIS — R109 Unspecified abdominal pain: Secondary | ICD-10-CM

## 2019-12-28 DIAGNOSIS — D72829 Elevated white blood cell count, unspecified: Secondary | ICD-10-CM | POA: Insufficient documentation

## 2019-12-28 DIAGNOSIS — R1030 Lower abdominal pain, unspecified: Secondary | ICD-10-CM | POA: Insufficient documentation

## 2019-12-28 HISTORY — DX: Unspecified abdominal pain: R10.9

## 2019-12-28 LAB — URINE CULTURE

## 2019-12-28 NOTE — Assessment & Plan Note (Signed)
Persistent, at least dating back through 01/2018 (can not find records prior to this). Neurophilic predominance, ranging 16-24, not exclusively collected with infectious process. Associated mild anemia and intermittent thrombocytosis (upper normal, 350-420), however suspect anemia is more related to known CKD IV. Non-smoker, not on chronic steroids. Placed referral to hematology, will repeat CBC with added blood smear on f/u visit.

## 2019-12-28 NOTE — Assessment & Plan Note (Addendum)
Cont on Crestor for primary prevention, however due to bothersome muscle cramps will transition to every other day dosing. Check LDL on next visit.

## 2019-12-28 NOTE — Assessment & Plan Note (Addendum)
Acute, follow up from recent ED visit 11/23. CT abdomen/pelvis without acute pathology (also s/p appendectomy/hysterectomy). Unclear etiology, may be multifactorial. Obtained U/A which appeared relatively clear, however given associated urinary sx, will obtain culture and empirically treat for UTI. Rx'd keflex x5 days renally dosed. Could also consider related to 5cm ovarian cyst seen on CT, likely physiologic and should involute with time, however will monitor closely. Doubt diverticulitis given presentation. Tylenol/heating pad for discomfort.

## 2019-12-29 DIAGNOSIS — H5203 Hypermetropia, bilateral: Secondary | ICD-10-CM | POA: Diagnosis not present

## 2019-12-29 DIAGNOSIS — H52223 Regular astigmatism, bilateral: Secondary | ICD-10-CM | POA: Diagnosis not present

## 2019-12-31 ENCOUNTER — Encounter: Payer: Self-pay | Admitting: Family Medicine

## 2020-01-01 ENCOUNTER — Other Ambulatory Visit: Payer: Self-pay | Admitting: Family Medicine

## 2020-01-01 DIAGNOSIS — I10 Essential (primary) hypertension: Secondary | ICD-10-CM

## 2020-01-10 ENCOUNTER — Other Ambulatory Visit: Payer: Self-pay | Admitting: Family Medicine

## 2020-01-10 ENCOUNTER — Telehealth: Payer: Self-pay | Admitting: Hematology and Oncology

## 2020-01-10 DIAGNOSIS — E785 Hyperlipidemia, unspecified: Secondary | ICD-10-CM

## 2020-01-10 NOTE — Telephone Encounter (Signed)
Received a new hem referral from Dr. Gwendlyn Deutscher for leukocytosis. Ms. Zoe Woodard returned my call and has been scheduled to see Dr. Chryl Heck on 1/14 at [place] [facility] to arrive 30 minutes early.

## 2020-01-25 ENCOUNTER — Inpatient Hospital Stay: Payer: Medicaid Other

## 2020-01-25 ENCOUNTER — Inpatient Hospital Stay: Payer: Medicaid Other | Admitting: Hematology and Oncology

## 2020-01-25 NOTE — Progress Notes (Incomplete)
[facility] CONSULT NOTE  Patient Care Team: [name], DO as PCP - General (Family Medicine)  CHIEF COMPLAINTS/PURPOSE OF CONSULTATION:  ***  ASSESSMENT & PLAN:  No problem-specific Assessment & Plan notes found for this encounter.  No orders of the defined types were placed in this encounter.    HISTORY OF PRESENTING ILLNESS:  Zoe Woodard 40 y.o. female is here because of leukocytosis.  REVIEW OF SYSTEMS:   Constitutional: Denies fevers, chills or abnormal night sweats Eyes: Denies blurriness of vision, double vision or watery eyes Ears, nose, mouth, throat, and face: Denies mucositis or sore throat Respiratory: Denies cough, dyspnea or wheezes Cardiovascular: Denies palpitation, chest discomfort or lower extremity swelling Gastrointestinal:  Denies nausea, heartburn or change in bowel habits Skin: Denies abnormal skin rashes Lymphatics: Denies new lymphadenopathy or easy bruising Neurological:Denies numbness, tingling or new weaknesses Behavioral/Psych: Mood is stable, no new changes  All other systems were reviewed with the patient and are negative.  MEDICAL HISTORY:  Past Medical History:  Diagnosis Date  . Chronic kidney disease   . Hyperlipidemia   . Hypertensive urgency 07/22/2019  . Hypocalcemia 08/22/2019  . Obesity   . Pyelonephritis 11/08/2019    SURGICAL HISTORY: Past Surgical History:  Procedure Laterality Date  . ABDOMINAL HYSTERECTOMY    . APPENDECTOMY    . bladder laceration  01/04/2015  . excision of thyroglobal duct cyst  02/01/2012  . right ovarian cystoectomy  01/02/2015  . right ovarian egstectomy  04/27/2013  . SPINE SURGERY    . TONSILLECTOMY    . TUBAL LIGATION      SOCIAL HISTORY: Social History   Socioeconomic History  . Marital status: Single    Spouse name: Not on file  . Number of children: Not on file  . Years of education: Not on file  . Highest education level: Not on file  Occupational History   . Not on file  Tobacco Use  . Smoking status: Never Smoker  . Smokeless tobacco: Never Used  Vaping Use  . Vaping Use: Never used  Substance and Sexual Activity  . Alcohol use: No  . Drug use: No  . Sexual activity: Not on file  Other Topics Concern  . Not on file  Social History Narrative   Lives with 3 children    Not working currently   Not sexually active.    Social Determinants of Health   Financial Resource Strain: Not on file  Food Insecurity: Not on file  Transportation Needs: Not on file  Physical Activity: Not on file  Stress: Not on file  Social Connections: Not on file  Intimate Partner Violence: Not on file    FAMILY HISTORY: Family History  Problem Relation Age of Onset  . Diabetes Mother   . Healthy Father     ALLERGIES:  has No Known Allergies.  MEDICATIONS:  Current Outpatient Medications  Medication Sig Dispense Refill  . rosuvastatin (CRESTOR) 10 MG tablet Take 1 tablet (10 mg total) by mouth every other day. 90 tablet 1  . amLODipine (NORVASC) 10 MG tablet Take 1 tablet (10 mg total) by mouth at bedtime. 30 tablet 3  . famotidine (PEPCID) 20 MG tablet Take 1 tablet (20 mg total) by mouth 2 (two) times daily. 60 tablet 3  . fluticasone (FLONASE) 50 MCG/ACT nasal spray Place 2 sprays into both nostrils daily. 16 g 1  . loratadine (CLARITIN) 10 MG tablet Take 1 tablet (10 mg total) by mouth daily. [place]  tablet 11  . metoprolol tartrate (LOPRESSOR) 25 MG tablet TAKE 1 TABLET BY MOUTH TWICE A DAY 60 tablet 1  . montelukast (SINGULAIR) 10 MG tablet Take 1 tablet (10 mg total) by mouth at bedtime. 30 tablet 3  . tiZANidine (ZANAFLEX) 4 MG tablet Take 4 mg by mouth every 6 (six) hours as needed.     No current facility-administered medications for this visit.     PHYSICAL EXAMINATION: ECOG PERFORMANCE STATUS: {CHL ONC ECOG PS:[id number]}  There were no vitals filed for this visit. There were no vitals filed for this visit.  GENERAL:alert, no  distress and comfortable SKIN: skin color, texture, turgor are normal, no rashes or significant lesions EYES: normal, conjunctiva are pink and non-injected, sclera clear OROPHARYNX:no exudate, no erythema and lips, buccal mucosa, and tongue normal  NECK: supple, thyroid normal size, non-tender, without nodularity LYMPH:  no palpable lymphadenopathy in the cervical, axillary or inguinal LUNGS: clear to auscultation and percussion with normal breathing effort HEART: regular rate & rhythm and no murmurs and no lower extremity edema ABDOMEN:abdomen soft, non-tender and normal bowel sounds Musculoskeletal:no cyanosis of digits and no clubbing  PSYCH: alert & oriented x 3 with fluent speech NEURO: no focal motor/sensory deficits  LABORATORY DATA:  I have reviewed the data as listed Lab Results  Component Value Date   HCT 36.0 10/09/2019     Chemistry      Component Value Date/Time   NA 135 10/09/2019 1222   K 4.6 10/09/2019 1222   CL 102 10/09/2019 1222   CO2 18 (L) 10/09/2019 1222   BUN 40 (H) 10/09/2019 1222   CREATININE 2.15 (H) 10/09/2019 1222      Component Value Date/Time   CALCIUM 8.7 10/09/2019 1222       RADIOGRAPHIC STUDIES: I have personally reviewed the radiological images as listed and agreed with the findings in the report. No results found.  All questions were answered. The patient knows to call the clinic with any problems, questions or concerns. I spent *** minutes in the care of this patient including H and P, review of records, counseling and coordination of care.     Benay Pike, MD 01/25/2020 9:05 AM

## 2020-01-28 DIAGNOSIS — R0682 Tachypnea, not elsewhere classified: Secondary | ICD-10-CM | POA: Diagnosis not present

## 2020-01-28 DIAGNOSIS — R0602 Shortness of breath: Secondary | ICD-10-CM | POA: Diagnosis not present

## 2020-01-28 DIAGNOSIS — R0789 Other chest pain: Secondary | ICD-10-CM | POA: Diagnosis not present

## 2020-01-28 DIAGNOSIS — U071 COVID-19: Secondary | ICD-10-CM | POA: Diagnosis not present

## 2020-01-31 ENCOUNTER — Ambulatory Visit: Payer: Medicaid Other | Admitting: Family Medicine

## 2020-01-31 ENCOUNTER — Other Ambulatory Visit: Payer: Self-pay | Admitting: *Deleted

## 2020-01-31 DIAGNOSIS — I10 Essential (primary) hypertension: Secondary | ICD-10-CM

## 2020-01-31 MED ORDER — AMLODIPINE BESYLATE 10 MG PO TABS: 10.0000 mg | ORAL_TABLET | Freq: Every day | ORAL | 3 refills | Status: DC

## 2020-02-04 ENCOUNTER — Telehealth: Payer: Self-pay | Admitting: Hematology and Oncology

## 2020-02-04 NOTE — Telephone Encounter (Signed)
I returned the pt's call and lft a vm to reschedule her appt.

## 2020-02-05 ENCOUNTER — Telehealth: Payer: Self-pay | Admitting: Hematology and Oncology

## 2020-02-05 NOTE — Telephone Encounter (Signed)
Received a call back from Zoe Woodard to r/s her appt to 2/10 at 1120am.

## 2020-02-07 ENCOUNTER — Ambulatory Visit (INDEPENDENT_AMBULATORY_CARE_PROVIDER_SITE_OTHER): Payer: Medicaid Other | Admitting: Family Medicine

## 2020-02-07 ENCOUNTER — Other Ambulatory Visit: Payer: Self-pay

## 2020-02-07 ENCOUNTER — Encounter: Payer: Self-pay | Admitting: Family Medicine

## 2020-02-07 VITALS — BP 112/80 | HR 86 | Ht 63.0 in | Wt 192.0 lb

## 2020-02-07 DIAGNOSIS — N184 Chronic kidney disease, stage 4 (severe): Secondary | ICD-10-CM | POA: Diagnosis not present

## 2020-02-07 DIAGNOSIS — U071 COVID-19: Secondary | ICD-10-CM

## 2020-02-07 DIAGNOSIS — I959 Hypotension, unspecified: Secondary | ICD-10-CM | POA: Diagnosis not present

## 2020-02-07 DIAGNOSIS — I10 Essential (primary) hypertension: Secondary | ICD-10-CM | POA: Diagnosis not present

## 2020-02-07 HISTORY — DX: Hypotension, unspecified: I95.9

## 2020-02-07 NOTE — Assessment & Plan Note (Addendum)
With underlying known chronic hypertension controlled on Lopressor and Norvasc.  SBP 80-90s at home, however 112/80 today without any antihypertensives.  Reassuringly well-appearing on exam without concern for sepsis, do suspect sequela of current/improving COVID-19 infection.  Unclear of prednisone is contributing as well, would have expected hypertension related to this.  No additional evidence of concurrent infection.  Doubt PE given normal V/Q on 1/17 and respiratory symptoms improving.  Recommended continue to monitor at home and continue holding Lopressor/Norvasc.  Restart 1/2 Lopressor if BP persistently >130/80 over the weekend.  Follow-up next week and bring BP cuff for comparison.

## 2020-02-07 NOTE — Assessment & Plan Note (Signed)
Now on approximately day 12 illness, however tested positive on 1/17.  Symptomatically improving, however continues to have mild dyspnea on exertion as expected.  Reassuringly VQ scan negative for PE on 1/17 and no evidence of pneumonia on CXR.  Not vaccinated against COVID, would not qualify for monoclonal antibodies at this point.  Will continue to monitor and provided expectations of likely prolonged recovery.

## 2020-02-07 NOTE — Assessment & Plan Note (Signed)
Recheck BMP to ensure no significant AKI contributing to above.

## 2020-02-07 NOTE — Patient Instructions (Addendum)
It was wonderful to see you today.  I am glad to hear that you are feeling better, however sorry that you continue to have some lingering fatigue and feeling short of breath with doing too much activity.  Please try to take it easy.  Make sure that you are following a well-balanced diet and drinking plenty of fluids.  I think is a good idea to hold your blood pressure medications for now.  Please check your BP at home with your cuff, if your blood pressure continues to go up and is consistently above >130/80 --then I recommend restarting your Lopressor half tablet and see how you are blood pressure response.  Please follow-up early next week or sooner if you have worsening difficulty breathing, chest pain, significant lightheadedness dizziness, fever.

## 2020-02-07 NOTE — Progress Notes (Signed)
SUBJECTIVE:   CHIEF COMPLAINT / HPI: Fu   Zoe Woodard is a 40 year old female presenting for follow-up after recently diagnosed with COVID-19 infection.  She has completed a 10-day quarantine as of yesterday.  COVID-19 follow-up: She was seen in the ED on 1/17 after having a few days of worsening shortness of breath, chest tightness, and headache.  Found to be COVID-19 positive, flu negative.  She has not been vaccinated against Covid.  X-ray without pneumonia.  V/Q scan negative for PE.  BMP with a creatinine slightly above her baseline however similar, CR 2.75.  Sent home with oral steroids and Tessalon Perles.  Reports she is slightly feeling better every day, however still feels easily winded with activity that is better from previous.  Denies any significant chest pain, shortness of breath at rest, leg swelling, fever.  Low blood pressure: SBP 80's at home while taking oral steroids for COVID. Stopped taking this after 3 days, however BP continued to feel low. So she stopped her BP medications about 4-5 days ago and then all medications about 2 days ago. Still has had some lower numbers at home, SBP 90's. Feeling a little lightheaded with position changes. Drinking plenty of fluids.  Reports otherwise feeling improved as discussed above.   PERTINENT  PMH / PSH: CKD stage IV, hypertension, chronic cough  OBJECTIVE:   BP 112/80   Pulse 86   Ht '5\' 3"'$  (1.6 m)   Wt 192 lb (87.1 kg)   SpO2 97%   BMI 34.01 kg/m   General: Alert, NAD, smiling HEENT: NCAT, MMM Cardiac: RRR no m/g/r Lungs: Clear bilaterally, no increased WOB, satting appropriately on room air.  Msk: Moves all extremities spontaneously, normal gait   Ext: Warm, dry, 2+ distal pulses, no edema b/l  Neuro: Alert and oriented, smile symmetrical, EOMI, follows normal conversation appropriately  Pulse ox repeated at rest: 99% Pulse ox with ambulation of approximately 100 feet: Remained around 97-99%  Did have to stop after  approximately 75 feet to take extra breath for a few seconds and then was able to continue back to her room.  No significant accessory muscle use or tachypnea.  ASSESSMENT/PLAN:   COVID-19 Now on approximately day 12 illness, however tested positive on 1/17.  Symptomatically improving, however continues to have mild dyspnea on exertion as expected.  Reassuringly VQ scan negative for PE on 1/17 and no evidence of pneumonia on CXR.  Not vaccinated against COVID, would not qualify for monoclonal antibodies at this point.  Will continue to monitor and provided expectations of likely prolonged recovery.  Low blood pressure With underlying known chronic hypertension controlled on Lopressor and Norvasc.  SBP 80-90s at home, however 112/80 today without any antihypertensives.  Reassuringly well-appearing on exam without concern for sepsis, do suspect sequela of current/improving COVID-19 infection.  Unclear of prednisone is contributing as well, would have expected hypertension related to this.  No additional evidence of concurrent infection.  Doubt PE given normal V/Q on 1/17 and respiratory symptoms improving.  Recommended continue to monitor at home and continue holding Lopressor/Norvasc.  Restart 1/2 Lopressor if BP persistently >130/80 over the weekend.  Follow-up next week and bring BP cuff for comparison.  CKD (chronic kidney disease) stage 4, GFR 15-29 ml/min (HCC) Recheck BMP to ensure no significant AKI contributing to above.    Follow-up next week for BP or sooner if development of worsening shortness of breath, chest pain, fever, significant lightheadedness/dizziness.  [name], DO [facility]

## 2020-02-08 ENCOUNTER — Encounter: Payer: Self-pay | Admitting: Family Medicine

## 2020-02-08 LAB — BASIC METABOLIC PANEL
BUN/Creatinine Ratio: 12 (ref 9–23)
BUN: 27 mg/dL — ABNORMAL HIGH (ref 6–24)
CO2: 22 mmol/L (ref 20–29)
Calcium: 9.1 mg/dL (ref 8.7–10.2)
Chloride: 102 mmol/L (ref 96–106)
Creatinine, Ser: 2.2 mg/dL — ABNORMAL HIGH (ref 0.57–1.00)
GFR calc Af Amer: 31 mL/min/{1.73_m2} — ABNORMAL LOW (ref >59)
GFR calc non Af Amer: 27 mL/min/{1.73_m2} — ABNORMAL LOW (ref >59)
Glucose: 91 mg/dL (ref 65–99)
Potassium: 4.7 mmol/L (ref 3.5–5.2)
Sodium: 139 mmol/L (ref 134–144)

## 2020-02-11 ENCOUNTER — Telehealth: Payer: Self-pay

## 2020-02-11 NOTE — Telephone Encounter (Signed)
Yes she may increase to a full tab twice daily. If her BP continues to be >140/90 in the next 2 days after this, can add back on her Norvasc as well. Thank you!   [name], DO

## 2020-02-11 NOTE — Telephone Encounter (Signed)
Patient calls nurse line reporting she has been taking 1/2 Lopressor since Friday, however she does not feel this is controlling her BP. Patient repots her BPs at home since changing meds on Friday have been upper 140s/80s. Patient has an apt on 02/4, however would like to know if she should increase to whole tab, or wait until apt. Please advise.

## 2020-02-12 NOTE — Telephone Encounter (Signed)
LVM for patient to call back to discuss BP medications.

## 2020-02-12 NOTE — Telephone Encounter (Signed)
Patient calls back. Patient informed to increase to a full tab if BPs have been >140/90. Patient reports last night BP was 148/90s. Patient will increase to full tab and FU as scheduled on 02/04.

## 2020-02-15 ENCOUNTER — Encounter: Payer: Self-pay | Admitting: Family Medicine

## 2020-02-15 ENCOUNTER — Other Ambulatory Visit: Payer: Self-pay

## 2020-02-15 ENCOUNTER — Ambulatory Visit: Payer: Medicaid Other | Admitting: Family Medicine

## 2020-02-15 VITALS — BP 122/80 | HR 79 | Ht 63.0 in | Wt 192.2 lb

## 2020-02-15 DIAGNOSIS — U071 COVID-19: Secondary | ICD-10-CM

## 2020-02-15 DIAGNOSIS — E785 Hyperlipidemia, unspecified: Secondary | ICD-10-CM | POA: Diagnosis not present

## 2020-02-15 DIAGNOSIS — I959 Hypotension, unspecified: Secondary | ICD-10-CM | POA: Diagnosis not present

## 2020-02-15 DIAGNOSIS — N184 Chronic kidney disease, stage 4 (severe): Secondary | ICD-10-CM | POA: Diagnosis not present

## 2020-02-15 NOTE — Assessment & Plan Note (Addendum)
Out of quarantine time and significantly improved, however continues to have intermittent residual headaches and fatigue.  Neurologically intact.  Recommended ice/heat with Tylenol for her headaches and encouraged a well-balanced diet with physical activity to hopefully combat the fatigue that should improve with time.

## 2020-02-15 NOTE — Assessment & Plan Note (Addendum)
Resolved after recent illness.  Will add back on her Norvasc 5 mg today, continue metoprolol BID as is.  May continue monitoring BP at home 2-3 times weekly for the next 2 weeks and then less often once controlled.  Compared her home cuff today which was essentially comparable to our reading.

## 2020-02-15 NOTE — Assessment & Plan Note (Signed)
Stable, most recently checked on 1/27.  Follows up with her nephrologist next week.

## 2020-02-15 NOTE — Progress Notes (Signed)
    SUBJECTIVE:   CHIEF COMPLAINT / HPI: FU BP   Ms. Zoe Woodard is a 40 year old female presenting for follow-up of low blood pressure.  She was recently seen in our office on 1/27 after being diagnosed with COVID-19 on 1/17 and noting lower blood pressures at home.  All blood pressure medications have been discontinued with instructions to slowly restart over the weekend if elevated.  Today she reports that after monitoring her BP at home it consistently started to increase around SBP 130-150s.  She now is back to a full tablet of metoprolol twice daily.  She has not restarted Norvasc yet.  BP since full tablet start has been around about [id number] systolic.  She also relates that she continues to feel better after recent COVID-19 illness.  She still is having intermittent headaches, predominantly right temporal with photophobia.  Denies any nausea, vomiting, numbness/weakness, or visual changes with this.  She has been using Tylenol with some relief.  Still feeling fatigued, however denies any further shortness of breath or chest pain.  PERTINENT  PMH / PSH:  CKD stage IV, hypertension, chronic cough  OBJECTIVE:   BP 122/80   Pulse 79   Ht '5\' 3"'$  (1.6 m)   Wt 192 lb 3.2 oz (87.2 kg)   SpO2 97%   BMI 34.05 kg/m   General: Alert, NAD HEENT: NCAT, MMM Lungs: No increased WOB  Msk: Moves all extremities spontaneously  Ext: Warm, dry, 2+ distal pulses Neuro: Alert and oriented.  EOMI, PERRLA.  Smile symmetrical, speech normal and follows appropriate conversation.  5/5 upper and lower extremity strength with normal gait.  Sensation to light touch intact throughout.  ASSESSMENT/PLAN:   Low blood pressure Resolved after recent illness.  Will add back on her Norvasc 5 mg today, continue metoprolol BID as is.  May continue monitoring BP at home 2-3 times weekly for the next 2 weeks and then less often once controlled.  Compared her home cuff today which was essentially comparable to our  reading.  Hyperlipidemia She self discontinued her statin after having low blood pressures 2 weeks ago.  Will restart her Crestor today.  COVID-19 Out of quarantine time and significantly improved, however continues to have intermittent residual headaches and fatigue.  Neurologically intact.  Recommended ice/heat with Tylenol for her headaches and encouraged a well-balanced diet with physical activity to hopefully combat the fatigue that should improve with time.  CKD (chronic kidney disease) stage 4, GFR 15-29 ml/min (HCC) Stable, most recently checked on 1/27.  Follows up with her nephrologist next week.    Follow-up in approximately 1 month, if doing well with BP controlled can follow-up in 2-3 months.  [name], [place]

## 2020-02-15 NOTE — Patient Instructions (Addendum)
Add back on Norvasc and crestor MWF.   Goal <130/80.

## 2020-02-15 NOTE — Assessment & Plan Note (Signed)
She self discontinued her statin after having low blood pressures 2 weeks ago.  Will restart her Crestor today.

## 2020-02-21 ENCOUNTER — Inpatient Hospital Stay: Payer: Medicaid Other | Attending: Hematology and Oncology | Admitting: Hematology and Oncology

## 2020-02-21 ENCOUNTER — Telehealth: Payer: Self-pay | Admitting: Hematology and Oncology

## 2020-02-21 ENCOUNTER — Other Ambulatory Visit: Payer: Self-pay

## 2020-02-21 ENCOUNTER — Inpatient Hospital Stay: Payer: Medicaid Other

## 2020-02-21 ENCOUNTER — Encounter: Payer: Self-pay | Admitting: Hematology and Oncology

## 2020-02-21 VITALS — BP 117/83 | HR 84 | Temp 98.4°F | Resp 18 | Ht 63.0 in | Wt 193.3 lb

## 2020-02-21 DIAGNOSIS — D72829 Elevated white blood cell count, unspecified: Secondary | ICD-10-CM

## 2020-02-21 DIAGNOSIS — D649 Anemia, unspecified: Secondary | ICD-10-CM | POA: Diagnosis not present

## 2020-02-21 DIAGNOSIS — N189 Chronic kidney disease, unspecified: Secondary | ICD-10-CM | POA: Insufficient documentation

## 2020-02-21 LAB — CMP (CANCER CENTER ONLY)
ALT: 13 U/L (ref 0–44)
AST: 12 U/L — ABNORMAL LOW (ref 15–41)
Albumin: 3.5 g/dL (ref 3.5–5.0)
Alkaline Phosphatase: 150 U/L — ABNORMAL HIGH (ref 38–126)
Anion gap: 10 (ref 5–15)
BUN: 40 mg/dL — ABNORMAL HIGH (ref 6–20)
CO2: 23 mmol/L (ref 22–32)
Calcium: 8.9 mg/dL (ref 8.9–10.3)
Chloride: 107 mmol/L (ref 98–111)
Creatinine: 2.1 mg/dL — ABNORMAL HIGH (ref 0.44–1.00)
GFR, Estimated: 30 mL/min — ABNORMAL LOW (ref >60)
Glucose, Bld: 98 mg/dL (ref 70–99)
Potassium: 4.1 mmol/L (ref 3.5–5.1)
Sodium: 140 mmol/L (ref 135–145)
Total Bilirubin: 0.3 mg/dL (ref 0.3–1.2)
Total Protein: 7.5 g/dL (ref 6.5–8.1)

## 2020-02-21 LAB — CBC WITH DIFFERENTIAL/PLATELET
Abs Immature Granulocytes: 0.16 10*3/uL — ABNORMAL HIGH (ref 0.00–0.07)
Basophils Absolute: 0.1 10*3/uL (ref 0.0–0.1)
Basophils Relative: 0 %
Eosinophils Absolute: 0.1 10*3/uL (ref 0.0–0.5)
Eosinophils Relative: 1 %
HCT: 37.6 % (ref 36.0–46.0)
Hemoglobin: 12.2 g/dL (ref 12.0–15.0)
Immature Granulocytes: 1 %
Lymphocytes Relative: 21 %
Lymphs Abs: 3.2 10*3/uL (ref 0.7–4.0)
MCH: 26.8 pg (ref 26.0–34.0)
MCHC: 32.4 g/dL (ref 30.0–36.0)
MCV: 82.6 fL (ref 80.0–100.0)
Monocytes Absolute: 0.7 10*3/uL (ref 0.1–1.0)
Monocytes Relative: 5 %
Neutro Abs: 11.5 10*3/uL — ABNORMAL HIGH (ref 1.7–7.7)
Neutrophils Relative %: 72 %
Platelets: 438 10*3/uL — ABNORMAL HIGH (ref 150–400)
RBC: 4.55 MIL/uL (ref 3.87–5.11)
RDW: 14.1 % (ref 11.5–15.5)
WBC: 15.8 10*3/uL — ABNORMAL HIGH (ref 4.0–10.5)
nRBC: 0 % (ref 0.0–0.2)

## 2020-02-21 LAB — FERRITIN: Ferritin: 53 ng/mL (ref 11–307)

## 2020-02-21 LAB — IRON AND TIBC
Iron: 56 ug/dL (ref 41–142)
Saturation Ratios: 19 % — ABNORMAL LOW (ref 21–57)
TIBC: 289 ug/dL (ref 236–444)
UIBC: 233 ug/dL (ref 120–384)

## 2020-02-21 LAB — VITAMIN B12: Vitamin B-12: 176 pg/mL — ABNORMAL LOW (ref 180–914)

## 2020-02-21 NOTE — Telephone Encounter (Signed)
Scheduled appointment per 2/10 los. Spoke to patient who is aware of appointment date and time.

## 2020-02-21 NOTE — Progress Notes (Signed)
Please print all material in [place]

## 2020-02-21 NOTE — Progress Notes (Signed)
[facility] CONSULT NOTE  [facility] Care Team: [name], DO as PCP - General (Family Medicine)  CHIEF COMPLAINTS/PURPOSE OF CONSULTATION:  Leukocytosis  ASSESSMENT & PLAN:  No problem-specific Assessment & Plan notes found for Zoe encounter.  No orders of the defined types were placed in Zoe encounter.  Zoe is a pleasant 40 year old female [facility] with past medical history significant for hypertension, dyslipidemia referred to hematology for evaluation of persistent leukocytosis for the past several years.  She denies except presently diagnosed hypertension chronic kidney disease.   Review of systems and physical examination unremarkable.  I reviewed her labs over the past several years and she has had persistent leukocytosis with occasional spikes in the white blood cell count.  Most recently she was also found to have mild anemia.  1.  Persistent leukocytosis, advised to proceed with myeloproliferative work-up. Low suspicion for myeloproliferative disorder given the chronicity of leukocytosis and she is quite young and clinically asymptomatic.  2.  Anemia likely related to chronic kidney disease however I have ordered CBC, iron panel, ferritin, [id number] and folic acid levels today.  No evidence of iron deficiency.  B12 levels are low, will start her on B12 1000 mcg a day.  She will return to clinic in 4 weeks to review the labs and to discuss any additional recommendations.  Thank you for consulting [place] in the care of Zoe [facility].  Please not hesitate to contact us with any additional questions or concerns.  HISTORY OF PRESENTING ILLNESS:  Zoe [facility] 40 y.o. female is here because of leukocytosis.  Zoe is a very pleasant 40 year old healthy female [facility] with past medical history significant for hypertension, chronic kidney disease and dyslipidemia referred to hematology for evaluation of persistent leukocytosis.  Zoe [facility] speaks excellent English and had  translator.  She has fevers, drenching night sweats, loss of appetite, she tells me that she last year when she was hypertension and chronic kidney disease and is now on medications.  She does have a vague memory and incident that happened in 2008 when she was supposed to have her back surgery weeks because she had persistently elevated white blood cell count.  She denies any erythromelalgia, intractable pruritus, history of thromboembolic episodes.  She denies any change in breathing, bowel habits or urinary habits.  She denies any chronic infections such as sinus infections, dental issues.  She denies any steroid use.  Rest of the pertinent 10 point ROS reviewed and as mentioned below.  REVIEW OF SYSTEMS:   Constitutional: Denies fevers, chills or abnormal night sweats Eyes: Denies blurriness of vision, double vision or watery eyes Ears, nose, mouth, throat, and face: Denies mucositis or sore throat Respiratory: Denies cough, dyspnea or wheezes Cardiovascular: Denies palpitation, chest discomfort or lower extremity swelling Gastrointestinal:  Denies nausea, heartburn or change in bowel habits Skin: Denies abnormal skin rashes Lymphatics: Denies new lymphadenopathy or easy bruising Neurological:Denies numbness, tingling or new weaknesses Behavioral/Psych: Mood is stable, no new changes  All other systems were reviewed with the [facility] and are negative.  MEDICAL HISTORY:  Past Medical History:  Diagnosis Date  . Chronic kidney disease   . Hyperlipidemia   . Hypertensive urgency 07/22/2019  . Hypocalcemia 08/22/2019  . Obesity   . Pyelonephritis 11/08/2019    SURGICAL HISTORY: Past Surgical History:  Procedure Laterality Date  . ABDOMINAL HYSTERECTOMY    . APPENDECTOMY    . bladder laceration  01/04/2015  . excision of thyroglobal duct cyst  02/01/2012  .  right ovarian cystoectomy  01/02/2015  . right ovarian egstectomy  04/27/2013  . SPINE SURGERY    . TONSILLECTOMY    . TUBAL  LIGATION      SOCIAL HISTORY: Social History   Socioeconomic History  . Marital status: Single    Spouse name: Not on file  . Number of children: Not on file  . Years of education: Not on file  . Highest education level: Not on file  Occupational History  . Not on file  Tobacco Use  . Smoking status: Never Smoker  . Smokeless tobacco: Never Used  Vaping Use  . Vaping Use: Never used  Substance and Sexual Activity  . Alcohol use: No  . Drug use: No  . Sexual activity: Not on file  Other Topics Concern  . Not on file  Social History Narrative   Lives with 3 children    Not working currently   Not sexually active.    Social Determinants of Health   Financial Resource Strain: Not on file  Food Insecurity: Not on file  Transportation Needs: Not on file  Physical Activity: Not on file  Stress: Not on file  Social Connections: Not on file  Intimate Partner Violence: Not on file    FAMILY HISTORY: Family History  Problem Relation Age of Onset  . Diabetes Mother   . Healthy Father     ALLERGIES:  has No Known Allergies.  MEDICATIONS:  Current Outpatient Medications  Medication Sig Dispense Refill  . amLODipine (NORVASC) 10 MG tablet Take 1 tablet (10 mg total) by mouth at bedtime. ([facility] taking differently: Take 10 mg by mouth at bedtime. Half a tablet at bedtime) 30 tablet 3  . metoprolol tartrate (LOPRESSOR) 25 MG tablet TAKE 1 TABLET BY MOUTH TWICE A DAY 60 tablet 1  . rosuvastatin (CRESTOR) 10 MG tablet Take 1 tablet (10 mg total) by mouth every other day. ([facility] taking differently: Take 10 mg by mouth every other day. Takes on M-W-F) 90 tablet 1  . famotidine (PEPCID) 20 MG tablet Take 1 tablet (20 mg total) by mouth 2 (two) times daily. ([facility] not taking: Reported on 02/21/2020) 60 tablet 3  . fluticasone (FLONASE) 50 MCG/ACT nasal spray Place 2 sprays into both nostrils daily. ([facility] not taking: Reported on 02/21/2020) 16 g 1  . loratadine (CLARITIN) 10  MG tablet Take 1 tablet (10 mg total) by mouth daily. ([facility] not taking: Reported on 02/21/2020) 30 tablet 11  . montelukast (SINGULAIR) 10 MG tablet Take 1 tablet (10 mg total) by mouth at bedtime. ([facility] not taking: Reported on 02/21/2020) 30 tablet 3   No current facility-administered medications for Zoe visit.     PHYSICAL EXAMINATION: ECOG PERFORMANCE STATUS: 0 - Asymptomatic  Vitals:   02/21/20 1005  BP: 117/83  Pulse: 84  Resp: 18  Temp: 98.4 F (36.9 C)  SpO2: 100%   Filed Weights   02/21/20 1005  Weight: 193 lb 4.8 oz (87.7 kg)    GENERAL:alert, no distress and comfortable SKIN: skin color, texture, turgor are normal, no rashes or significant lesions EYES: normal, conjunctiva are pink and non-injected, sclera clear OROPHARYNX:no exudate, no erythema and lips, buccal mucosa, and tongue normal  NECK: supple, thyroid normal size, non-tender, without nodularity LYMPH:  no palpable lymphadenopathy in the cervical, axillary or inguinal LUNGS: clear to auscultation and percussion with normal breathing effort HEART: regular rate & rhythm and no murmurs and no lower extremity edema ABDOMEN:abdomen soft, non-tender and normal bowel sounds  Musculoskeletal:no cyanosis of digits and no clubbing  PSYCH: alert & oriented x 3 with fluent speech NEURO: no focal motor/sensory deficits  LABORATORY DATA:  I have reviewed the data as listed Lab Results  Component Value Date   HCT 36.0 10/09/2019     Chemistry      Component Value Date/Time   NA 139 02/07/2020 1116   K 4.7 02/07/2020 1116   CL 102 02/07/2020 1116   CO2 22 02/07/2020 1116   BUN 27 (H) 02/07/2020 1116   CREATININE 2.20 (H) 02/07/2020 1116      Component Value Date/Time   CALCIUM 9.1 02/07/2020 1116       RADIOGRAPHIC STUDIES: I have personally reviewed the radiological images as listed and agreed with the findings in the report. No results found.  All questions were answered. The [facility] knows to  call the clinic with any problems, questions or concerns. I spent 30 minutes in the care of Zoe [facility] including H and P, review of records, counseling and coordination of care.     Benay Pike, MD 02/21/2020 10:36 AM

## 2020-02-22 LAB — FOLATE RBC
Folate, Hemolysate: 533 ng/mL
Folate, RBC: 1414 ng/mL (ref >498)
Hematocrit: 37.7 % (ref 34.0–46.6)

## 2020-02-26 ENCOUNTER — Other Ambulatory Visit: Payer: Medicaid Other | Admitting: *Deleted

## 2020-02-26 ENCOUNTER — Telehealth: Payer: Self-pay

## 2020-02-26 DIAGNOSIS — R252 Cramp and spasm: Secondary | ICD-10-CM | POA: Diagnosis not present

## 2020-02-26 DIAGNOSIS — N184 Chronic kidney disease, stage 4 (severe): Secondary | ICD-10-CM | POA: Diagnosis not present

## 2020-02-26 DIAGNOSIS — R053 Chronic cough: Secondary | ICD-10-CM | POA: Diagnosis not present

## 2020-02-26 DIAGNOSIS — I129 Hypertensive chronic kidney disease with stage 1 through stage 4 chronic kidney disease, or unspecified chronic kidney disease: Secondary | ICD-10-CM | POA: Diagnosis not present

## 2020-02-26 NOTE — Telephone Encounter (Signed)
-----   Message from Benay Pike, MD sent at 02/22/2020  4:34 PM EST ----- Myriam Jacobson  Please ask her to start taking B12 1000 mcg supplementation daily. This is OTC  Thanks,

## 2020-02-26 NOTE — Telephone Encounter (Signed)
I called the Patient to go over her lab results. The patient was unable to pick up the phone so I left a detailed message on her voicemail per DPR to advise her to begin taking V B12 1000 mcg once per day. I asked the Patient to call me with any questions/concerns.

## 2020-02-26 NOTE — Patient Instructions (Signed)
Visit Information  Ms. Laurence Ferrari  - as a part of your Medicaid benefit, you are eligible for care management and care coordination services at no cost or copay. I was unable to reach you by phone today but would be happy to help you with your health related needs. Please feel free to call me @ [phone].   A member of the Managed Medicaid care management team will reach out to you again over the next 7-14 days.   Lurena Joiner RN, BSN [place]  [employer] manager

## 2020-02-26 NOTE — Patient Outreach (Signed)
Care Coordination  02/26/2020  Zoe [facility] [birth year] [id number]    Medicaid Managed Care   Unsuccessful Outreach Note  02/26/2020 Name: Zoe [facility] MRN: [id number] DOB: Nov 25, 1980  Referred by: [name], DO Reason for referral : High Risk Managed Medicaid (Unsuccessful RNCM initial outreach)   An unsuccessful telephone outreach was attempted today. The patient was referred to the case management team for assistance with care management and care coordination.    This call was made utilizing Nyoka Lint, [place] interpreter # [phone].  Follow Up Plan: A HIPAA compliant phone message was left for the patient providing contact information and requesting a return call.  The care management team will reach out to the patient again over the next 7-14 days.   Lurena Joiner RN, BSN [place]  [employer] manager

## 2020-03-11 ENCOUNTER — Other Ambulatory Visit: Payer: Self-pay

## 2020-03-11 ENCOUNTER — Ambulatory Visit
Admission: RE | Admit: 2020-03-11 | Discharge: 2020-03-11 | Disposition: A | Discharge status: Home / Self Care | Payer: Medicaid Other | Source: Ambulatory Visit | Attending: Family Medicine | Admitting: Family Medicine

## 2020-03-11 ENCOUNTER — Other Ambulatory Visit: Payer: Self-pay | Admitting: Family Medicine

## 2020-03-11 DIAGNOSIS — N6310 Unspecified lump in the right breast, unspecified quadrant: Secondary | ICD-10-CM

## 2020-03-12 ENCOUNTER — Ambulatory Visit (INDEPENDENT_AMBULATORY_CARE_PROVIDER_SITE_OTHER): Payer: Medicaid Other | Admitting: Family Medicine

## 2020-03-12 ENCOUNTER — Ambulatory Visit (HOSPITAL_COMMUNITY)
Admission: RE | Admit: 2020-03-12 | Discharge: 2020-03-12 | Disposition: A | Discharge status: Home / Self Care | Payer: Medicaid Other | Source: Ambulatory Visit | Attending: Family Medicine | Admitting: Family Medicine

## 2020-03-12 ENCOUNTER — Other Ambulatory Visit: Payer: Self-pay

## 2020-03-12 ENCOUNTER — Encounter: Payer: Self-pay | Admitting: Family Medicine

## 2020-03-12 VITALS — BP 104/70 | HR 84 | Ht 63.0 in | Wt 194.4 lb

## 2020-03-12 DIAGNOSIS — R079 Chest pain, unspecified: Secondary | ICD-10-CM

## 2020-03-12 DIAGNOSIS — E785 Hyperlipidemia, unspecified: Secondary | ICD-10-CM

## 2020-03-12 DIAGNOSIS — I10 Essential (primary) hypertension: Secondary | ICD-10-CM | POA: Diagnosis not present

## 2020-03-12 DIAGNOSIS — R0789 Other chest pain: Secondary | ICD-10-CM | POA: Diagnosis not present

## 2020-03-12 DIAGNOSIS — N63 Unspecified lump in unspecified breast: Secondary | ICD-10-CM

## 2020-03-12 NOTE — Patient Instructions (Signed)
Wonderful to see you today!   Restart your pepcid '20mg'$  daily (you can increase to twice daily if needed- taken together or morning/evening)   Try ice/heat on your chest for 20 min 2-3 times a day for the next week.   Increase physical activity slowly as tolerated, around 10-20 minutes as tolerated.   If this happens again and not improving--please call/go to ED

## 2020-03-12 NOTE — Progress Notes (Signed)
    SUBJECTIVE:   CHIEF COMPLAINT / HPI: Check-in BP  Tress is a 40 year old female presenting to discuss the following:  Hypertension: SBP 112-125 at home.  Has been taking amlodipine 5 mg and metoprolol 25 mg twice daily.  Has not had any further low blood pressures at home.  Chest pain: She reports a solitary episode of chest pain that occurred this past Friday, 2/25.  States she went on a 40-minute walk and by the end of the walk she started experiencing left-sided pressure-like sensation.  She felt a little bit short of breath and clammy with this.  Nonpleuritic.  Took a Tylenol, then showered and took a nap.  She felt better when she woke up, however the same chest discomfort lingered for approximately 2 days and has since resolved.  She has never had anything like this before and has not had any additional episodes since then.  Since that time she has still been able to walk around the house/do chores and walk outside without concern, has not done a full 40-minute walk again because she is nervous.  She has no significant family history of heart disease.  Non-smoker.  Ate a meal right before her walk.  She has been coughing since Covid and recently had a flare of her allergies last week.  After restarting her allergy medications this has greatly improved.  PERTINENT  PMH / PSH: CKD stage IV, hypertension, chronic cough  OBJECTIVE:   BP 104/70   Pulse 84   Ht '5\' 3"'$  (1.6 m)   Wt 194 lb 6.4 oz (88.2 kg)   SpO2 97%   BMI 34.44 kg/m   General: Alert, NAD HEENT: NCAT, MMM Cardiac: RRR no m/g/r, exquisitely tender to palpation of anterior left-sided intercostal space Lungs: Clear bilaterally, no increased WOB, satting appropriately on room air, no crackles or wheezing heard Msk: Moves all extremities spontaneously  Ext: Warm, dry, 2+ distal pulses, no edema   ASSESSMENT/PLAN:   Atypical chest pain Solitary episode lasting 2 days, resolved.  EKG in office today NSR with nonspecific  ST changes, similar to previous, no T wave inversions or Q waves present.  Suspect is likely multifactorial in the setting of deconditioning with recovery from COVID-19 in 01/2020, MSK with recent coughing, and uncontrolled GERD as she recently discontinued Pepcid several weeks ago.  Considered cardiac etiology, RF including CKD, BMI, HTN, however given atypical nature presentation suspect this is less likely.  Certainly with her risk factors if recurs, will consider cardiology referral.  Recommended ice to region for the next week, restarting Pepcid, and slowly titrating up activity as tolerated.  Essential hypertension No further lower blood pressures, BP now within goal.  Will continue Norvasc 5 mg and metoprolol 25 mg twice daily.  Hyperlipidemia History of moderate hypertriglyceridemia, recently restarted Crestor after discontinuation for mild myalgias previously.  Will hold off recheck of lipid panel given recent restart, should check on next visit.    Follow-up in approximately 1 month or sooner if any further episodes.  ED precautions discussed.   [name], [place]

## 2020-03-13 ENCOUNTER — Telehealth: Payer: Self-pay

## 2020-03-13 ENCOUNTER — Encounter: Payer: Self-pay | Admitting: Family Medicine

## 2020-03-13 DIAGNOSIS — R0789 Other chest pain: Secondary | ICD-10-CM | POA: Insufficient documentation

## 2020-03-13 NOTE — Assessment & Plan Note (Signed)
No further lower blood pressures, BP now within goal.  Will continue Norvasc 5 mg and metoprolol 25 mg twice daily.

## 2020-03-13 NOTE — Assessment & Plan Note (Signed)
Solitary episode lasting 2 days, resolved.  EKG in office today NSR with nonspecific ST changes, similar to previous, no T wave inversions or Q waves present.  Suspect is likely multifactorial in the setting of deconditioning with recovery from COVID-19 in 01/2020, MSK with recent coughing, and uncontrolled GERD as she recently discontinued Pepcid several weeks ago.  Considered cardiac etiology, RF including CKD, BMI, HTN, however given atypical nature presentation suspect this is less likely.  Certainly with her risk factors if recurs, will consider cardiology referral.  Recommended ice to region for the next week, restarting Pepcid, and slowly titrating up activity as tolerated.

## 2020-03-13 NOTE — Assessment & Plan Note (Addendum)
History of moderate hypertriglyceridemia, recently restarted Crestor after discontinuation for mild myalgias previously.  Will hold off recheck of lipid panel given recent restart, should check on next visit.

## 2020-03-13 NOTE — Telephone Encounter (Signed)
According to the Lab, when this patient had her blood drawn the specimens were lost and they never made it to the bioreference lab. They advised me that the specimens would need to be recollected. Dr. Chryl Heck has been notified and advised me to contact the patient to see if she could come back in to have her blood redrawn.  I tried contacting the patient and was unable to reach her. I left a detailed message on her VM per DPR advising her to please call us back in reference to this.

## 2020-03-14 ENCOUNTER — Inpatient Hospital Stay: Payer: Medicaid Other | Attending: Hematology and Oncology

## 2020-03-14 ENCOUNTER — Other Ambulatory Visit: Payer: Self-pay

## 2020-03-14 DIAGNOSIS — D72829 Elevated white blood cell count, unspecified: Secondary | ICD-10-CM

## 2020-03-14 DIAGNOSIS — D649 Anemia, unspecified: Secondary | ICD-10-CM | POA: Diagnosis not present

## 2020-03-14 DIAGNOSIS — E538 Deficiency of other specified B group vitamins: Secondary | ICD-10-CM | POA: Insufficient documentation

## 2020-03-14 LAB — CBC WITH DIFFERENTIAL/PLATELET
Abs Immature Granulocytes: 0.11 10*3/uL — ABNORMAL HIGH (ref 0.00–0.07)
Basophils Absolute: 0.1 10*3/uL (ref 0.0–0.1)
Basophils Relative: 0 %
Eosinophils Absolute: 0.1 10*3/uL (ref 0.0–0.5)
Eosinophils Relative: 1 %
HCT: 36.6 % (ref 36.0–46.0)
Hemoglobin: 11.7 g/dL — ABNORMAL LOW (ref 12.0–15.0)
Immature Granulocytes: 1 %
Lymphocytes Relative: 22 %
Lymphs Abs: 3.3 10*3/uL (ref 0.7–4.0)
MCH: 27 pg (ref 26.0–34.0)
MCHC: 32 g/dL (ref 30.0–36.0)
MCV: 84.3 fL (ref 80.0–100.0)
Monocytes Absolute: 0.7 10*3/uL (ref 0.1–1.0)
Monocytes Relative: 4 %
Neutro Abs: 10.9 10*3/uL — ABNORMAL HIGH (ref 1.7–7.7)
Neutrophils Relative %: 72 %
Platelets: 411 10*3/uL — ABNORMAL HIGH (ref 150–400)
RBC: 4.34 MIL/uL (ref 3.87–5.11)
RDW: 14 % (ref 11.5–15.5)
WBC: 15.2 10*3/uL — ABNORMAL HIGH (ref 4.0–10.5)
nRBC: 0 % (ref 0.0–0.2)

## 2020-03-14 NOTE — Progress Notes (Signed)
Patient will be coming in today for blood work. Somehow these specimens were lost and never made it to the biorefernce lab. Putting orders in for JAK2 (including V617F and Exon 12), MPL, & CALR W/RFL MPN Panel ([facility]) and BCR ABL FISH ([facility]). High priority Scheduling message sent to have the patient come in today for blood work prior to her appointment next week.

## 2020-03-19 ENCOUNTER — Inpatient Hospital Stay (HOSPITAL_BASED_OUTPATIENT_CLINIC_OR_DEPARTMENT_OTHER): Payer: Medicaid Other | Admitting: Hematology and Oncology

## 2020-03-19 ENCOUNTER — Telehealth: Payer: Self-pay | Admitting: Hematology and Oncology

## 2020-03-19 ENCOUNTER — Other Ambulatory Visit: Payer: Self-pay

## 2020-03-19 ENCOUNTER — Encounter: Payer: Self-pay | Admitting: Hematology and Oncology

## 2020-03-19 ENCOUNTER — Other Ambulatory Visit: Payer: Self-pay | Admitting: Family Medicine

## 2020-03-19 VITALS — BP 115/84 | HR 95 | Temp 97.4°F | Resp 20 | Ht 63.0 in | Wt 196.7 lb

## 2020-03-19 DIAGNOSIS — D72825 Bandemia: Secondary | ICD-10-CM

## 2020-03-19 DIAGNOSIS — D649 Anemia, unspecified: Secondary | ICD-10-CM

## 2020-03-19 DIAGNOSIS — I10 Essential (primary) hypertension: Secondary | ICD-10-CM

## 2020-03-19 DIAGNOSIS — D72829 Elevated white blood cell count, unspecified: Secondary | ICD-10-CM | POA: Diagnosis not present

## 2020-03-19 DIAGNOSIS — D75839 Thrombocytosis, unspecified: Secondary | ICD-10-CM | POA: Diagnosis not present

## 2020-03-19 NOTE — Progress Notes (Signed)
[name] CONSULT NOTE  Patient Care Team: [name], DO as PCP - General (Family Medicine)  CHIEF COMPLAINTS/PURPOSE OF CONSULTATION:  Leukocytosis  ASSESSMENT & PLAN:  No problem-specific Assessment & Plan notes found for this encounter.  No orders of the defined types were placed in this encounter.  This is a pleasant 40 year old female patient with past medical history significant for hypertension, dyslipidemia referred to hematology for evaluation of persistent leukocytosis for the past several years.   1.  Leukocytosis with neutrophilia and thrombocytosis. Been ordered myeloproliferative work-up during her last visit however the sample was lost in transit and patient returned recently today when repeat blood draw which is pending at this time.  If there are any notable findings concerning for myeloproliferative disorder, we will arrange for sooner follow-up and discuss recommendations.  2.  B12 deficiency, she was started on B12 supplementation 1000 mcg daily.  We will continue to monitor.  3.  Anemia, normocytic, normochromic likely related to chronic kidney disease.  4.  Slightly elevated alkaline phosphatase, continue to monitor this on future labs.  HISTORY OF PRESENTING ILLNESS:  Zoe [facility] 39 y.o. female is here because of leukocytosis.  This is a very pleasant 40 year old healthy female patient with past medical history significant for hypertension, chronic kidney disease and dyslipidemia referred to hematology for evaluation of persistent leukocytosis.  Ms. Rosilyn speaks excellent English and had translator.    Interval history Patient arrived to the appointment here for lab review and any additional recommendations.  She has been doing quite well.  She feels more energetic.  She has started back on Pepcid since she last saw her primary care physician.  She denies any breathing, change in bowel habits or urinary habits.  Rest of the pertinent  10 point ROS as mentioned below and negative.  REVIEW OF SYSTEMS:   Constitutional: Denies fevers, chills or abnormal night sweats Eyes: Denies blurriness of vision, double vision or watery eyes Ears, nose, mouth, throat, and face: Denies mucositis or sore throat Respiratory: Denies cough, dyspnea or wheezes Cardiovascular: Denies palpitation, chest discomfort or lower extremity swelling Gastrointestinal:  Denies nausea, heartburn or change in bowel habits Skin: Denies abnormal skin rashes Lymphatics: Denies new lymphadenopathy or easy bruising Neurological:Denies numbness, tingling or new weaknesses Behavioral/Psych: Mood is stable, no new changes  All other systems were reviewed with the patient and are negative.  MEDICAL HISTORY:  Past Medical History:  Diagnosis Date  . Abdominal pain 12/28/2019  . Back pain 10/10/2019  . Chronic kidney disease   . Hyperlipidemia   . Hypertensive urgency 07/22/2019  . Hypocalcemia 08/22/2019  . Low blood pressure 02/07/2020  . Obesity   . Pyelonephritis 11/08/2019    SURGICAL HISTORY: Past Surgical History:  Procedure Laterality Date  . ABDOMINAL HYSTERECTOMY    . APPENDECTOMY    . bladder laceration  01/04/2015  . excision of thyroglobal duct cyst  02/01/2012  . right ovarian cystoectomy  01/02/2015  . right ovarian egstectomy  04/27/2013  . SPINE SURGERY    . TONSILLECTOMY    . TUBAL LIGATION      SOCIAL HISTORY: Social History   Socioeconomic History  . Marital status: Single    Spouse name: Not on file  . Number of children: Not on file  . Years of education: Not on file  . Highest education level: Not on file  Occupational History  . Not on file  Tobacco Use  . Smoking status: Never Smoker  .  Smokeless tobacco: Never Used  Vaping Use  . Vaping Use: Never used  Substance and Sexual Activity  . Alcohol use: No  . Drug use: No  . Sexual activity: Not on file  Other Topics Concern  . Not on file  Social History  Narrative   Lives with 3 children    Not working currently   Not sexually active.    Social Determinants of Health   Financial Resource Strain: Not on file  Food Insecurity: Not on file  Transportation Needs: Not on file  Physical Activity: Not on file  Stress: Not on file  Social Connections: Not on file  Intimate Partner Violence: Not on file    FAMILY HISTORY: Family History  Problem Relation Age of Onset  . Diabetes Mother   . Healthy Father     ALLERGIES:  has No Known Allergies.  MEDICATIONS:  Current Outpatient Medications  Medication Sig Dispense Refill  . amLODipine (NORVASC) 10 MG tablet Take 1 tablet (10 mg total) by mouth at bedtime. (Patient taking differently: Take 10 mg by mouth at bedtime. Half a tablet at bedtime) 30 tablet 3  . famotidine (PEPCID) 20 MG tablet Take 20 mg by mouth daily.    Marland Kitchen loratadine (CLARITIN) 10 MG tablet Take 1 tablet (10 mg total) by mouth daily. 30 tablet 11  . metoprolol tartrate (LOPRESSOR) 25 MG tablet TAKE 1 TABLET BY MOUTH TWICE A DAY 60 tablet 1  . montelukast (SINGULAIR) 10 MG tablet Take 1 tablet (10 mg total) by mouth at bedtime. 30 tablet 3  . rosuvastatin (CRESTOR) 10 MG tablet Take 1 tablet (10 mg total) by mouth every other day. (Patient taking differently: Take 10 mg by mouth every other day. Takes on M-W-F) 90 tablet 1   No current facility-administered medications for this visit.     PHYSICAL EXAMINATION: ECOG PERFORMANCE STATUS: 0 - Asymptomatic  Vitals:   03/19/20 1329  BP: 115/84  Pulse: 95  Resp: 20  Temp: (!) 97.4 F (36.3 C)  SpO2: 99%   Filed Weights   03/19/20 1329  Weight: 196 lb 11.2 oz (89.2 kg)    GENERAL:alert, no distress and comfortable SKIN: skin color, texture, turgor are normal, no rashes or significant lesions EYES: normal, conjunctiva are pink and non-injected, sclera clear OROPHARYNX:no exudate, no erythema and lips, buccal mucosa, and tongue normal  NECK: supple, thyroid normal  size, non-tender, without nodularity LYMPH:  no palpable lymphadenopathy in the cervical, axillary or inguinal LUNGS: clear to auscultation and percussion with normal breathing effort HEART: regular rate & rhythm and no murmurs and no lower extremity edema ABDOMEN:abdomen soft, non-tender and normal bowel sounds Musculoskeletal:no cyanosis of digits and no clubbing  PSYCH: alert & oriented x 3 with fluent speech NEURO: no focal motor/sensory deficits  LABORATORY DATA:  I have reviewed the data as listed Lab Results  Component Value Date   WBC 15.2 (H) 03/14/2020   HGB 11.7 (L) 03/14/2020   HCT 36.6 03/14/2020   MCV 84.3 03/14/2020   PLT 411 (H) 03/14/2020     Chemistry      Component Value Date/Time   NA 140 02/21/2020 1051   NA 139 02/07/2020 1116   K 4.1 02/21/2020 1051   CL 107 02/21/2020 1051   CO2 23 02/21/2020 1051   BUN 40 (H) 02/21/2020 1051   BUN 27 (H) 02/07/2020 1116   CREATININE 2.10 (H) 02/21/2020 1051      Component Value Date/Time   CALCIUM 8.9 02/21/2020  1051   ALKPHOS 150 (H) 02/21/2020 1051   AST 12 (L) 02/21/2020 1051   ALT 13 02/21/2020 1051   BILITOT 0.3 02/21/2020 1051       RADIOGRAPHIC STUDIES: I have personally reviewed the radiological images as listed and agreed with the findings in the report. US BREAST LTD UNI RIGHT INC AXILLA  Result Date: 03/11/2020 CLINICAL DATA:  Short-term follow-up for likely benign right breast masses. EXAM: ULTRASOUND OF THE RIGHT BREAST COMPARISON:  Previous exam(s). FINDINGS: Ultrasound targeted to the right breast at 3 o'clock in the retroareolar location demonstrates a stable hypoechoic oval circumscribed mass measuring 0.8 x 0.5 x 0.8 cm, previously 0.8 x 0.5 x 0.7 cm. Ultrasound targeted to the right breast at 9 o'clock, 2 cm from the nipple demonstrates a stable oval circumscribed hypoechoic mass measuring 1.6 x 0.8 x 1.1 cm, previously 1.7 x 0.8 x 1.1 cm. IMPRESSION: The 2 likely benign right breast masses are  stable. RECOMMENDATION: Bilateral diagnostic mammogram and right breast ultrasound recommended in early September 2022. I have discussed the findings and recommendations with the patient. If applicable, a reminder letter will be sent to the patient regarding the next appointment. BI-RADS CATEGORY  3: Probably benign. Electronically Signed   By: Ammie Ferrier M.D.   On: 03/11/2020 10:23    All questions were answered. The patient knows to call the clinic with any problems, questions or concerns. I spent 20 minutes in the care of this patient including H and P, review of records, counseling and coordination of care.     Benay Pike, MD 03/19/2020 1:47 PM

## 2020-03-19 NOTE — Telephone Encounter (Signed)
Scheduled appointment per 3/9 los. Spoke to patient who is aware of appointment date and time. Gave patient calendar print out.

## 2020-03-21 LAB — JAK2 (INCLUDING V617F AND EXON 12), MPL,& CALR W/RFL MPN PANEL (NGS)

## 2020-03-21 LAB — BCR ABL1 FISH (GENPATH)

## 2020-04-05 DIAGNOSIS — N3289 Other specified disorders of bladder: Secondary | ICD-10-CM | POA: Diagnosis not present

## 2020-04-05 DIAGNOSIS — D72829 Elevated white blood cell count, unspecified: Secondary | ICD-10-CM | POA: Diagnosis not present

## 2020-04-05 DIAGNOSIS — N183 Chronic kidney disease, stage 3 unspecified: Secondary | ICD-10-CM | POA: Diagnosis not present

## 2020-04-05 DIAGNOSIS — N838 Other noninflammatory disorders of ovary, fallopian tube and broad ligament: Secondary | ICD-10-CM | POA: Diagnosis not present

## 2020-04-05 DIAGNOSIS — N83202 Unspecified ovarian cyst, left side: Secondary | ICD-10-CM | POA: Diagnosis not present

## 2020-04-05 DIAGNOSIS — R1032 Left lower quadrant pain: Secondary | ICD-10-CM | POA: Diagnosis not present

## 2020-04-05 DIAGNOSIS — N189 Chronic kidney disease, unspecified: Secondary | ICD-10-CM | POA: Diagnosis not present

## 2020-04-05 DIAGNOSIS — I129 Hypertensive chronic kidney disease with stage 1 through stage 4 chronic kidney disease, or unspecified chronic kidney disease: Secondary | ICD-10-CM | POA: Diagnosis not present

## 2020-04-07 ENCOUNTER — Telehealth: Payer: Self-pay | Admitting: *Deleted

## 2020-04-07 NOTE — Telephone Encounter (Signed)
Transition Care Management Follow-up Telephone Call  Date of discharge and from where: 04/05/2020 - [facility]  How have you been since you were released from the hospital? "Okay"  Any questions or concerns? No  Items Reviewed:  Did the pt receive and understand the discharge instructions provided? Yes   Medications obtained and verified? Yes   Other? No   Any new allergies since your discharge? No   Dietary orders reviewed? No  Do you have support at home? Yes    Functional Questionnaire: (I = Independent and D = Dependent) ADLs: I  Bathing/Dressing- I  Meal Prep- I  Eating- I  Maintaining continence- I  Transferring/Ambulation- I  Managing Meds- I  Follow up appointments reviewed:   PCP Hospital f/u appt confirmed? No    Specialist Hospital f/u appt confirmed? No    Are transportation arrangements needed? No   If their condition worsens, is the pt aware to call PCP or go to the Emergency Dept.? Yes  Was the patient provided with contact information for the PCP's office or ED? Yes  Was to pt encouraged to call back with questions or concerns? Yes  Call was completed with a Spanish language interpreter

## 2020-04-14 ENCOUNTER — Ambulatory Visit: Payer: Medicaid Other | Admitting: Family Medicine

## 2020-04-14 ENCOUNTER — Encounter: Payer: Self-pay | Admitting: Family Medicine

## 2020-04-14 ENCOUNTER — Other Ambulatory Visit: Payer: Self-pay

## 2020-04-14 VITALS — BP 112/70 | HR 91 | Ht 63.0 in | Wt 193.6 lb

## 2020-04-14 DIAGNOSIS — H9203 Otalgia, bilateral: Secondary | ICD-10-CM | POA: Diagnosis not present

## 2020-04-14 DIAGNOSIS — N949 Unspecified condition associated with female genital organs and menstrual cycle: Secondary | ICD-10-CM | POA: Diagnosis not present

## 2020-04-14 DIAGNOSIS — R053 Chronic cough: Secondary | ICD-10-CM | POA: Diagnosis not present

## 2020-04-14 MED ORDER — BENZONATATE 100 MG PO CAPS: 100.0000 mg | ORAL_CAPSULE | Freq: Two times a day (BID) | ORAL | 0 refills | Status: DC | PRN

## 2020-04-14 MED ORDER — MOMETASONE FUROATE 50 MCG/ACT NA SUSP: 2.0000 | Freq: Every day | NASAL | 12 refills | Status: DC

## 2020-04-14 NOTE — Patient Instructions (Signed)
It was wonderful to see you today.  I will place a referral to OB/GYN to evaluate your cyst.  Additionally please start this nasal spray every day in addition to your allergy medication to see if this makes a difference in your ear pain.  Lastly you can try these TMJ exercises to realign your jaw if that might also be contributing.

## 2020-04-14 NOTE — Progress Notes (Signed)
    SUBJECTIVE:   CHIEF COMPLAINT / HPI:  Follow up   Zoe Woodard is a 40 year old female presenting for follow-up.  Chest pain: Recently seen last month for chest pain, states this now has resolved.  No further issue.  ED follow-up: Seen 3/26.  Reports she initially went due to bilateral ear pain, however ended up being evaluated for her bilateral (L>R) lower abdominal pain. CT abdomen without renal stone, however does show 2.8X 2.8 cm cyst in the left adnexa, possibly ovarian in origin.  Preemptively treated for possible PID with ceftriaxone, doxycycline, and Flagyl.  STD testing returned negative.  Patient has not been sexually active in quite some time.  Reports she was unable to complete antibiotics due to GI upset.  Abdominal pain has improved, she requests OB/GYN referral to evaluate the cyst.  She has a history of recurrent cysts that have required ovarian cystectomies in 2015/2016.  Ear pain: Bilateral, nonprogressive 3-week history.  Endorses ear soreness and intermittent itchiness.  Otherwise feels at baseline, no recent fever.  Recently restarted her allergy medication due to mild cough and congestion.  Denies any sinus pressure or ear popping.  No ear drainage.    PERTINENT  PMH / PSH: CKD stage IV, hypertension, chronic cough  OBJECTIVE:   BP 112/70   Pulse 91   Ht '5\' 3"'$  (1.6 m)   Wt 193 lb 9.6 oz (87.8 kg)   SpO2 98%   BMI 34.29 kg/m   General: Alert, NAD HEENT: NCAT, MMM, bilateral external ear canals normal without any swelling or erythema.  Bilateral TM clear with appropriate light reflex, no effusion seen.  No ear drainage present bilaterally.  Occasional TMJ click with jaw opening/closing.  No mastoid swelling bilaterally. 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  Fortunately no evidence of otitis media or externa on exam.  Rx'd Nasonex (Flonase gave headaches) and provided TMJ exercises.  Follow-up if not improving in the next few weeks or sooner if worsening.  Adnexal cyst 2.8 X2.8 left-sided, possibly ovarian in nature via CT on 3/26, consistent with her area of recurrent abdominal pain.  Known history of previous ovarian cystectomies.  Placed referral to OB/GYN for further evaluation.    Follow-up if not improving/worsening, otherwise follow-up in 1 month for general follow-up per patient comfort.  [name], [place]

## 2020-04-15 ENCOUNTER — Encounter: Payer: Self-pay | Admitting: Family Medicine

## 2020-04-15 DIAGNOSIS — H9203 Otalgia, bilateral: Secondary | ICD-10-CM

## 2020-04-15 DIAGNOSIS — N949 Unspecified condition associated with female genital organs and menstrual cycle: Secondary | ICD-10-CM | POA: Insufficient documentation

## 2020-04-15 HISTORY — DX: Otalgia, bilateral: H92.03

## 2020-04-15 NOTE — Assessment & Plan Note (Signed)
2.8 X2.8 left-sided, possibly ovarian in nature via CT on 3/26, consistent with her area of recurrent abdominal pain.  Known history of previous ovarian cystectomies.  Placed referral to OB/GYN for further evaluation.

## 2020-04-15 NOTE — Assessment & Plan Note (Signed)
Unclear etiology, suspect may be referred via TMJ dysfunction and allergic rhinitis (may have some eustachian tube dysfunction, but atypical presentation).  Fortunately no evidence of otitis media or externa on exam.  Rx'd Nasonex (Flonase gave headaches) and provided TMJ exercises.  Follow-up if not improving in the next few weeks or sooner if worsening.

## 2020-04-16 ENCOUNTER — Telehealth: Payer: Self-pay

## 2020-04-21 ENCOUNTER — Telehealth: Payer: Self-pay

## 2020-04-21 NOTE — Telephone Encounter (Signed)
Received fax from pharmacy that Mometasone is not covered by insurance. Please see formulary for preferred medications. Please advise if alternative is acceptable or if PA should be initiated.     Talbot Grumbling, RN

## 2020-04-21 NOTE — Telephone Encounter (Signed)
Patient has not tolerated flonase but would benefit from nasal steroid spray on daily basis, would be worth assessing if different one was better tolerated. Lets try prior auth. Thank you!  [name], DO

## 2020-04-22 DIAGNOSIS — R252 Cramp and spasm: Secondary | ICD-10-CM | POA: Diagnosis not present

## 2020-04-22 DIAGNOSIS — R053 Chronic cough: Secondary | ICD-10-CM | POA: Diagnosis not present

## 2020-04-22 DIAGNOSIS — N184 Chronic kidney disease, stage 4 (severe): Secondary | ICD-10-CM | POA: Diagnosis not present

## 2020-04-22 DIAGNOSIS — I129 Hypertensive chronic kidney disease with stage 1 through stage 4 chronic kidney disease, or unspecified chronic kidney disease: Secondary | ICD-10-CM | POA: Diagnosis not present

## 2020-04-22 NOTE — Telephone Encounter (Signed)
Received fax from pharmacy, PA needed on Mometasone.  Clinical questions submitted via Cover My Meds.  Waiting on response, could take up to 72 hours.   Cover My Meds info: Key: [id number]  Talbot Grumbling, RN

## 2020-04-22 NOTE — Telephone Encounter (Signed)
Thank you so much for the hard work on getting that done.  I will wait for the fax.

## 2020-04-22 NOTE — Telephone Encounter (Signed)
Please see the below determination. Insurance company will fax over next steps. Please advise.     Talbot Grumbling, RN

## 2020-04-27 DIAGNOSIS — Z9089 Acquired absence of other organs: Secondary | ICD-10-CM | POA: Diagnosis not present

## 2020-04-27 DIAGNOSIS — R1031 Right lower quadrant pain: Secondary | ICD-10-CM | POA: Diagnosis not present

## 2020-04-27 DIAGNOSIS — N83292 Other ovarian cyst, left side: Secondary | ICD-10-CM | POA: Diagnosis not present

## 2020-04-27 DIAGNOSIS — K760 Fatty (change of) liver, not elsewhere classified: Secondary | ICD-10-CM | POA: Diagnosis not present

## 2020-04-27 DIAGNOSIS — N83201 Unspecified ovarian cyst, right side: Secondary | ICD-10-CM | POA: Diagnosis not present

## 2020-04-27 DIAGNOSIS — K5790 Diverticulosis of intestine, part unspecified, without perforation or abscess without bleeding: Secondary | ICD-10-CM | POA: Diagnosis not present

## 2020-04-28 ENCOUNTER — Telehealth: Payer: Self-pay | Admitting: *Deleted

## 2020-04-28 NOTE — Telephone Encounter (Signed)
Transition Care Management Unsuccessful Follow-up Telephone Call  Date of discharge and from where:  04/27/2020 - [facility]  Attempts:  1st Attempt  Reason for unsuccessful TCM follow-up call:  Voice mail full

## 2020-04-29 NOTE — Telephone Encounter (Signed)
Transition Care Management Unsuccessful Follow-up Telephone Call  Date of discharge and from where:  04/27/2020 - [facility]  Attempts:  2nd Attempt  Reason for unsuccessful TCM follow-up call:  Voice mail full

## 2020-04-30 NOTE — Telephone Encounter (Signed)
Transition Care Management Unsuccessful Follow-up Telephone Call  Date of discharge and from where:  04/27/2020 - [facility]  Attempts:  3rd Attempt  Reason for unsuccessful TCM follow-up call:  Voice mail full

## 2020-05-10 ENCOUNTER — Other Ambulatory Visit: Payer: Self-pay | Admitting: Family Medicine

## 2020-05-10 DIAGNOSIS — R053 Chronic cough: Secondary | ICD-10-CM

## 2020-05-14 ENCOUNTER — Other Ambulatory Visit: Payer: Self-pay

## 2020-05-14 ENCOUNTER — Encounter: Payer: Self-pay | Admitting: Family Medicine

## 2020-05-14 ENCOUNTER — Ambulatory Visit (INDEPENDENT_AMBULATORY_CARE_PROVIDER_SITE_OTHER): Payer: Medicaid Other | Admitting: Family Medicine

## 2020-05-14 VITALS — BP 110/82 | HR 98 | Ht 63.0 in | Wt 194.8 lb

## 2020-05-14 DIAGNOSIS — I10 Essential (primary) hypertension: Secondary | ICD-10-CM

## 2020-05-14 DIAGNOSIS — N184 Chronic kidney disease, stage 4 (severe): Secondary | ICD-10-CM | POA: Diagnosis not present

## 2020-05-14 DIAGNOSIS — D72825 Bandemia: Secondary | ICD-10-CM

## 2020-05-14 DIAGNOSIS — R053 Chronic cough: Secondary | ICD-10-CM | POA: Diagnosis not present

## 2020-05-14 DIAGNOSIS — N949 Unspecified condition associated with female genital organs and menstrual cycle: Secondary | ICD-10-CM | POA: Diagnosis present

## 2020-05-14 NOTE — Assessment & Plan Note (Signed)
Discussed her montelukast was refilled on 5/2, instructed to call if her pharmacy has any difficulty with receiving this refill.

## 2020-05-14 NOTE — Assessment & Plan Note (Signed)
Continued observation through her nephrologist.  Remained stable.

## 2020-05-14 NOTE — Progress Notes (Signed)
    SUBJECTIVE:   CHIEF COMPLAINT / HPI: Check in   Caliope is a 40 year old female presenting for general follow-up and to check-in.  She reports today that overall she is doing well, still having intermittent lower pelvic pains.  She recently was seen in the ED on 4/17 and had an updated U/S pelvis with Doppler that showed a 4.2 cm complex right ovary cyst which may be a hemorrhagic cyst or an endometrioma with some right adnexal free fluid.  No torsion.  CT abdomen/pelvis overall unremarkable-she does have a history of appendectomy and hysterectomy.  Denies any urinary symptoms, no dysuria/urinary frequency/urgency.  She also reports that she got a call from [facility] and needs a refill of her montelukast.  Also states she did not hear the final results of the remainder of her leukocytosis labs from her hematologist, would like to go over them.  PERTINENT  PMH / PSH: CKD stage IV, hypertension, chronic cough  OBJECTIVE:   BP 110/82   Pulse 98   Ht '5\' 3"'$  (1.6 m)   Wt 194 lb 12.8 oz (88.4 kg)   SpO2 98%   BMI 34.51 kg/m   General: Alert, NAD HEENT: NCAT, MMM Cardiac: RRR  Lungs: Clear bilaterally, no increased WOB  Abdomen: soft, non-distended, tenderness in suprapubic region without any rebounding or guarding.  Normoactive BS.  No CVA tenderness bilaterally. Msk: Normal gait  Ext: Warm, dry, 2+ distal pulses, no edema   ASSESSMENT/PLAN:   Adnexal cyst Appears symptomatic with recurrent pelvic pain. 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  Awaiting OB/GYN evaluation on 5/9.  CKD (chronic kidney disease) stage 4, GFR 15-29 ml/min (HCC) Continued observation through her nephrologist.  Remained stable.  Chronic cough Discussed her montelukast was refilled on 5/2, instructed to call if her pharmacy has any difficulty with receiving this refill.  Leukocytosis Reviewed hematology labs with patient.  Overall appear  reassuring, however recommended calling her hematologist office to have them review these formally with her.  She is already aware of B12 deficiency and has been taking her B12 supplements.  Essential hypertension Well-controlled.  Continue Norvasc 5 mg and metoprolol 25 twice daily.    Follow-up in approximately 1 month or sooner if needed.  [name], [place]

## 2020-05-14 NOTE — Assessment & Plan Note (Signed)
Reviewed hematology labs with patient.  Overall appear reassuring, however recommended calling her hematologist office to have them review these formally with her.  She is already aware of B12 deficiency and has been taking her B12 supplements.

## 2020-05-14 NOTE — Assessment & Plan Note (Signed)
Appears symptomatic with recurrent pelvic pain. 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  Awaiting OB/GYN evaluation on 5/9.

## 2020-05-14 NOTE — Patient Instructions (Signed)
Wonderful to see you!  Look forward to hearing how visits go with your ob/gyn and kidney doctor. Keep using heating/ice pad, tylenol as needed.

## 2020-05-14 NOTE — Assessment & Plan Note (Signed)
Well-controlled.  Continue Norvasc 5 mg and metoprolol 25 twice daily.

## 2020-05-16 ENCOUNTER — Other Ambulatory Visit: Payer: Self-pay | Admitting: Family Medicine

## 2020-05-16 DIAGNOSIS — I10 Essential (primary) hypertension: Secondary | ICD-10-CM

## 2020-05-19 ENCOUNTER — Ambulatory Visit (INDEPENDENT_AMBULATORY_CARE_PROVIDER_SITE_OTHER): Payer: Medicaid Other | Admitting: Obstetrics and Gynecology

## 2020-05-19 ENCOUNTER — Encounter: Payer: Self-pay | Admitting: Obstetrics and Gynecology

## 2020-05-19 ENCOUNTER — Other Ambulatory Visit: Payer: Self-pay

## 2020-05-19 VITALS — BP 125/87 | HR 90 | Temp 98.8°F | Ht 63.0 in | Wt 195.4 lb

## 2020-05-19 DIAGNOSIS — N83201 Unspecified ovarian cyst, right side: Secondary | ICD-10-CM

## 2020-05-19 NOTE — Progress Notes (Signed)
40 yo presenting today to follow up on an ovarian cyst seen on ultrasound during evaluation for pelvic pain. Patient reports left lower quadrant pain with minimal relief with tylenol. Hydrocodone relieves the pain. Patient is unable to take ibuprofen secondary to CKD. Pain is non radiating. Patient reports presence of pain since March. Patient with history of ovarian cysts with cystectomies x 2. Patient with history of TAH secondary to menorrhagia  Past Medical History:  Diagnosis Date  . Abdominal pain 12/28/2019  . Back pain 10/10/2019  . Chronic kidney disease   . Ear pain, bilateral 04/15/2020  . Hyperlipidemia   . Hypertensive urgency 07/22/2019  . Hypocalcemia 08/22/2019  . Low blood pressure 02/07/2020  . Obesity   . Pyelonephritis 11/08/2019   Past Surgical History:  Procedure Laterality Date  . ABDOMINAL HYSTERECTOMY    . APPENDECTOMY    . bladder laceration  01/04/2015  . excision of thyroglobal duct cyst  02/01/2012  . right ovarian cystoectomy  01/02/2015  . right ovarian egstectomy  04/27/2013  . SPINE SURGERY    . TONSILLECTOMY    . TUBAL LIGATION     Family History  Problem Relation Age of Onset  . Diabetes Mother   . Healthy Father    Social History   Tobacco Use  . Smoking status: Never Smoker  . Smokeless tobacco: Never Used  Vaping Use  . Vaping Use: Never used  Substance Use Topics  . Alcohol use: No  . Drug use: No   ROS See pertinent in HPI. All other systems reviewed and non contributory  Blood pressure 125/87, pulse 90, temperature 98.8 F (37.1 C), temperature source Oral, height '5\' 3"'$  (1.6 m), weight 195 lb 6.4 oz (88.6 kg).  GENERAL: Well-developed, well-nourished female in no acute distress.  LUNGS: Clear to auscultation bilaterally.  HEART: Regular rate and rhythm. ABDOMEN: Soft, nontender, nondistended. No organomegaly. PELVIC: Normal external female genitalia. Vagina is pink and rugated.  Normal discharge. Vaginal vault intact. No adnexal  mass or tenderness. EXTREMITIES: No cyanosis, clubbing, or edema, 2+ distal pulses.  April 17, 22 Ultrasound and CT abd/pelvic  Marjory Lies, MD - 04/27/2020  Formatting of this note might be different from the original.  INDICATION: Pain   COMPARISON: CT 04/05/2020   TECHNIQUE: Transabdominal and transvaginal pelvic ultrasound.   FINDINGS: Hysterectomy. Right ovary measures4.8 x 3.9 x 4.7 cm and contains a complex cyst measuring 4.2 x 3.5 x 3.9 cm. Right adnexal free fluid. Left ovary is not identified. Right ovarian flow is documented by duplex color with spectral analysis.    IMPRESSION: 4.2 cm complex cyst right ovary suggesting a hemorrhagic cyst or endometrioma. Other etiologies not excluded. Followup study could be obtained in 4-6 weeks to document stability or resolution.   Electronically Signed by: Ruta Hinds on 04/27/2020 7:39 AM  Auringer, Marisue Humble, MD - 04/27/2020  Formatting of this note might be different from the original.  INDICATION: abd pain.  COMPARISON: 04/05/2020   TECHNIQUE: Routine CT of the abdomen and pelvis was performed without intravenous or oral contrast per renalcalculus evaluation protocol. This exam is intended to evaluate for presence of renal calculi and does not adequately assess for renal or urothelial malignancies. Radiation dose reduction was utilized (automated exposure control, mA or kV adjustment based on patient size, or iterative image reconstruction).   FINDINGS:   CT ABDOMEN: Lung bases clear and heart size normal. Fatty infiltration of the liver. Normal gallbladder, biliary tree, pancreas, spleen, adrenal glands, and  kidneys. No ascites or adenopathy.   CT PELVIS: Normal bladder and vaginal cuff status post hysterectomy. Normal left ovary. 4.9 x 4.7 cm hemorrhagic right ovarian cyst. Small amount of right adnexal free fluid. Right adnexal tubal ligation clip. Diverticulosis. Surgical clips at the cecal tip suggesting appendectomy. No  evidence for bowel obstruction, diverticulitis, or appendicitis. DDD L5-S1.    IMPRESSION:  1. 4.9 cm hemorrhagic right ovarian cyst  2. Fatty liver  3. Hysterectomy  4. Right adnexal free fluid  5. Right adnexal tubal ligation clip  6. Diverticulosis without diverticulitis  7. Appendectomy  8. DDD L5-S1   Electronically Signed by: Ruta Hinds on 04/27/2020 8:33 AM   A/P 40 yo with a right ovarian cyst favoring a hemorrhagic cyst - Reassurance provided - Plan to repeat ultrasound for resolution at the end of this month - Pain management with hydrocodone - RTC post ultrasound for results and further management if indicated

## 2020-05-19 NOTE — Progress Notes (Signed)
New GYN patient presents for problem visit today.  Pt states she had U/S done at [facility]  Pt states pain is 10/10x pain is constant takes tylenol for relief.  Pain onset recently in March. First found out about cyst in 11/21. Pt states noticed irregular bleeding x 2 wks ago.   [id number].

## 2020-05-20 ENCOUNTER — Other Ambulatory Visit: Payer: Self-pay | Admitting: Family Medicine

## 2020-05-20 DIAGNOSIS — R053 Chronic cough: Secondary | ICD-10-CM

## 2020-06-06 ENCOUNTER — Other Ambulatory Visit: Payer: Self-pay | Admitting: Family Medicine

## 2020-06-06 DIAGNOSIS — I10 Essential (primary) hypertension: Secondary | ICD-10-CM

## 2020-06-10 ENCOUNTER — Ambulatory Visit (HOSPITAL_COMMUNITY)
Admission: RE | Admit: 2020-06-10 | Discharge: 2020-06-10 | Disposition: A | Discharge status: Home / Self Care | Payer: Medicaid Other | Source: Ambulatory Visit | Attending: Obstetrics and Gynecology | Admitting: Obstetrics and Gynecology

## 2020-06-10 ENCOUNTER — Other Ambulatory Visit: Payer: Self-pay

## 2020-06-10 DIAGNOSIS — N83292 Other ovarian cyst, left side: Secondary | ICD-10-CM | POA: Diagnosis not present

## 2020-06-10 DIAGNOSIS — N83201 Unspecified ovarian cyst, right side: Secondary | ICD-10-CM | POA: Diagnosis present

## 2020-06-10 DIAGNOSIS — Z90710 Acquired absence of both cervix and uterus: Secondary | ICD-10-CM | POA: Diagnosis not present

## 2020-06-11 ENCOUNTER — Ambulatory Visit (INDEPENDENT_AMBULATORY_CARE_PROVIDER_SITE_OTHER): Payer: Medicaid Other | Admitting: Family Medicine

## 2020-06-11 ENCOUNTER — Ambulatory Visit: Payer: Medicaid Other | Admitting: Women's Health

## 2020-06-11 ENCOUNTER — Encounter: Payer: Self-pay | Admitting: Family Medicine

## 2020-06-11 VITALS — BP 114/79 | HR 81 | Ht 63.0 in | Wt 198.0 lb

## 2020-06-11 DIAGNOSIS — R1030 Lower abdominal pain, unspecified: Secondary | ICD-10-CM | POA: Diagnosis not present

## 2020-06-11 MED ORDER — AMITRIPTYLINE HCL 10 MG PO TABS: 10.0000 mg | ORAL_TABLET | Freq: Every day | ORAL | 2 refills | Status: DC

## 2020-06-11 MED ORDER — POLYETHYLENE GLYCOL 3350 17 GM/SCOOP PO POWD: 17.0000 g | Freq: Every day | ORAL | 1 refills | Status: DC | PRN

## 2020-06-11 NOTE — Progress Notes (Signed)
Reports pain LLQ abdomen.

## 2020-06-11 NOTE — Progress Notes (Signed)
GYNECOLOGY OFFICE VISIT NOTE  History:   Zoe Woodard is a 40 y.o. G3P0 with hx of TAH and R ovarian cystectomy here today for follow up of [place].  Seen Dr. Elly Modena on 05/19/2020 At that time reported seeking f/u for TVUS from 04/2020 which showed ovarian cyst Also had CT A/P at that time which showed hemorrhagic R ovarian cyst and diverticulosis Reassurance provided, had repeat TVUS to ensure resolution yesterday which showed small bilateral cysts measuring 1.8 and 2.1 cm respectively   Today reports pain has not subsided Takes tylenol and it goes down but not completely Pain lasts for hours Does not think she is constipated Worried it might be her ovaries and that she might need another surgery Also hurts when she has to urinate.   Health Maintenance Due  Topic Date Due  . COVID-19 Vaccine (1) Never done  . HIV Screening  Never done  . Hepatitis C Screening  Never done  . TETANUS/TDAP  Never done    Past Medical History:  Diagnosis Date  . Abdominal pain 12/28/2019  . Back pain 10/10/2019  . Chronic kidney disease   . Ear pain, bilateral 04/15/2020  . Hyperlipidemia   . Hypertensive urgency 07/22/2019  . Hypocalcemia 08/22/2019  . Low blood pressure 02/07/2020  . Obesity   . Pyelonephritis 11/08/2019    Past Surgical History:  Procedure Laterality Date  . ABDOMINAL HYSTERECTOMY    . APPENDECTOMY    . bladder laceration  01/04/2015  . excision of thyroglobal duct cyst  02/01/2012  . right ovarian cystoectomy  01/02/2015  . right ovarian egstectomy  04/27/2013  . SPINE SURGERY    . TONSILLECTOMY    . TUBAL LIGATION      The following portions of the patient's history were reviewed and updated as appropriate: allergies, current medications, past family history, past medical history, past social history, past surgical history and problem list.   Health Maintenance:   Last pap: N/a, s/p TAH   Last mammogram:  09/10/2019, repeat scheduled for September   Review of  Systems:  Pertinent items noted in HPI and remainder of comprehensive ROS otherwise negative.  Physical Exam:  BP 114/79   Pulse 81   Ht '5\' 3"'$  (1.6 m)   Wt 198 lb (89.8 kg)   BMI 35.07 kg/m  CONSTITUTIONAL: Well-developed, well-nourished female in no acute distress.  HEENT:  Normocephalic, atraumatic. External right and left ear normal. No scleral icterus.  NECK: Normal range of motion, supple, no masses noted on observation SKIN: No rash noted. Not diaphoretic. No erythema. No pallor. MUSCULOSKELETAL: Normal range of motion. No edema noted. NEUROLOGIC: Alert and oriented to person, place, and time. Normal muscle tone coordination.  PSYCHIATRIC: Normal mood and affect. Normal behavior. Normal judgment and thought content. RESPIRATORY: Effort normal, no problems with respiration noted ABDOMEN: No masses noted. No other overt distention noted.  Tender to palpation diffusely in lower quadrants. No rebound or guarding.  PELVIC: Deferred  Labs and Imaging No results found for this or any previous visit (from the past 168 hour(s)). US PELVIC COMPLETE WITH TRANSVAGINAL  Result Date: 06/10/2020 CLINICAL DATA:  Follow-up ovarian cyst, prior cystectomy x2 , hysterectomy EXAM: TRANSABDOMINAL AND TRANSVAGINAL ULTRASOUND OF PELVIS TECHNIQUE: Both transabdominal and transvaginal ultrasound examinations of the pelvis were performed. Transabdominal technique was performed for global imaging of the pelvis including uterus, ovaries, adnexal regions, and pelvic cul-de-sac. It was necessary to proceed with endovaginal exam following the transabdominal exam to visualize the ovaries.  COMPARISON:  None FINDINGS: Uterus Surgically absent Endometrium N/A Right ovary Measurements: 3.7 x 2.0 x 2.5 cm = volume: 9.6 mL. Small cyst with a single thin septation within RIGHT ovary 2.1 cm greatest size; no follow-up imaging recommended. Left ovary Measurements: 3.1 x 2.7 x 2.5 cm = volume: 10.7 mL. Small cyst within LEFT  ovary 1.8 cm diameter; no follow-up imaging recommended. Other findings No free pelvic fluid.  No adnexal masses. IMPRESSION: Post hysterectomy. Small cysts within both ovaries as above; no followup imaging recommended. Note: This recommendation does not apply to premenarchal patients or to those with increased risk (genetic, family history, elevated tumor markers or other high-risk factors) of ovarian cancer. Reference: Radiology 2019 Nov; 293(2):359-371. Electronically Signed   By: Lavonia Dana M.D.   On: 06/10/2020 10:35      Assessment and Plan:   Problem List Items Addressed This Visit      Other   Lower abdominal pain - Primary    Reviewed follow up US showing resolution of hemorrhagic cyst. Also reviewed prior imaging with patient. She is concerned ongoing pain is related to her ovaries, I discussed with her that I think this is unlikely given small size of the cysts. In addition she reports initial pain was L sided while hemorrhagic cyst was R sided, and she endorses symptoms with urination. Other possibilities include constipation/diverticulosis (which was seen on CT) and interstitial cystitis. Recommend we obtain urine culture and do diagnostic/therapeutic trial of miralax and amitriptyline, follow up in 4 weeks.       Relevant Medications   polyethylene glycol powder (GLYCOLAX/MIRALAX) 17 GM/SCOOP powder   amitriptyline (ELAVIL) 10 MG tablet   Other Relevant Orders   Urine Culture      Routine preventative health maintenance measures emphasized. Please refer to After Visit Summary for other counseling recommendations.   Return in about 4 weeks (around 07/09/2020) for follow up lower abdominal pain.    Total face-to-face time with patient: 20 minutes.  Over 50% of encounter was spent on counseling and coordination of care.   Clarnce Flock, MD/[facility] [employer] [facility], [place]

## 2020-06-11 NOTE — Assessment & Plan Note (Addendum)
Reviewed follow up US showing resolution of hemorrhagic cyst. Also reviewed prior imaging with patient. She is concerned ongoing pain is related to her ovaries, I discussed with her that I think this is unlikely given small size of the cysts. In addition she reports initial pain was L sided while hemorrhagic cyst was R sided, and she endorses symptoms with urination. Other possibilities include constipation/diverticulosis (which was seen on CT) and interstitial cystitis. Recommend we obtain urine culture and do diagnostic/therapeutic trial of miralax and amitriptyline, follow up in 4 weeks.

## 2020-06-11 NOTE — Patient Instructions (Signed)
Interstitial Cystitis  Interstitial cystitis is inflammation of the bladder. This condition is also known as painful bladder syndrome. This may cause pain in the bladder area as well as a frequent and urgent need to urinate. The bladder is an organ that stores urine after the urine is made in the kidneys. The severity of interstitial cystitis can vary from person to person. You may have flare-ups, and then your symptoms may go away for a while. For many people, it becomes a long-term (chronic) problem. What are the causes? The cause of this condition is not known. What increases the risk? The following factors may make you more likely to develop this condition:  You are female.  You have fibromyalgia.  You have irritable bowel syndrome (IBS).  You have endometriosis. This condition may be aggravated by:  Stress.  Smoking.  Spicy foods. What are the signs or symptoms? Symptoms of interstitial cystitis vary, and they can change over time. Symptoms may include:  Discomfort or pain in the bladder area, which is in the lower abdomen. Pain can range from mild to severe. The pain may change in intensity as the bladder fills with urine or as it empties.  Pain in the pelvic area, between the hip bones.  An urgent need to urinate.  Frequent urination.  Pain during urination.  Pain during sex.  Blood in the urine.  Fatigue. For women, symptoms often get worse during menstruation. How is this diagnosed? This condition is diagnosed based on your symptoms, your medical history, and a physical exam. You may have tests to rule out other conditions, such as:  Urine tests.  Cystoscopy. For this test, a tool similar to a very thin telescope is used to look into your bladder.  Biopsy. This involves taking a sample of tissue from the bladder to be examined under a microscope. How is this treated? There is no cure for this condition, but treatment can help you control your symptoms. Work  closely with your health care provider to find the most effective treatments for you. Treatment options may include:  Medicines to relieve pain and reduce how often you feel the need to urinate. This treatment may include: ? A procedure where a small amount of medicine that eases irritation is put inside your bladder through a catheter (bladder instillation).  Lifestyle changes, such as changing your diet or taking steps to control stress.  Physical therapy. This may include: ? Exercises to help relax the pelvic floor muscles. ? Massage to relax tight muscles (myofascial release).  Learning ways to control when you urinate (bladder training).  Using a device that provides electrical stimulation to your nerves, which can relieve pain (neuromodulation therapy). The device is placed on your back, where it blocks the nerves that cause you to feel pain in your bladder area.  A procedure that stretches your bladder by filling it with air or fluid (hydrodistention).  Surgery. This is rare. It is only done for extreme cases, if other treatments do not help. Follow these instructions at home: Lifestyle  Learn and practice relaxation techniques, such as deep breathing and muscle relaxation.  Get care for your body and mental well-being, such as: ? Cognitive behavioral therapy (CBT). This therapy changes the way you think or act in response to the fatigue. This may help improve how you feel. ? Seeing a mental health therapist to evaluate and treat depression, if necessary.  Work with your health care provider on other ways to manage pain. Acupuncture may   be helpful.  Avoid drinking alcohol.  Do not use any products that contain nicotine or tobacco, such as cigarettes, e-cigarettes, and chewing tobacco. If you need help quitting, ask your health care provider. Eating and drinking  Make dietary changes as recommended by your health care provider. You may need to avoid: ? Spicy foods. ? Foods  that contain a lot of potassium.  Limit your intake of drinks that make you need to urinate. These include: ? Caffeinated drinks like soda, coffee, and tea. ? Alcohol. Bladder training  Use bladder training techniques as directed. Techniques may include: ? Urinating at scheduled times. ? Training yourself to delay urination.  Keep a bladder diary. ? Write down the times that you urinate and any symptoms that you have. This can help you find out which foods, liquids, or activities make your symptoms worse. ? Use your bladder diary to schedule bathroom trips. If you are away from home, plan to be near a bathroom at each of your scheduled times.  Make sure that you urinate just before you leave the house and just before you go to bed.   General instructions  Take over-the-counter and prescription medicines only as told by your health care provider.  You can try a warm or cool compress over your bladder for comfort.  Avoid wearing tight clothing.  Do exercises to relax your pelvic floor muscles as told by your physical therapist.  Keep all follow-up visits as told by your health care provider. This is important. Where to find more information To find more information or a support group near you, visit:  [employer]: urologyhealth.org  Interstitial Cystitis Association: ichelp.org Contact a health care provider if you have:  Symptoms that do not get better with treatment.  Pain or discomfort that gets worse.  More frequent urges to urinate.  A fever. Get help right away if:  You have no control over when you urinate. Summary  Interstitial cystitis is inflammation of the bladder.  This condition may cause pain in the bladder area as well as a frequent and urgent need to urinate.  You may have flare-ups of the condition, and then it may go away for a while. For many people, it becomes a long-term (chronic) problem.  There is no cure for interstitial cystitis,  but treatment methods are available to control your symptoms. This information is not intended to replace advice given to you by your health care provider. Make sure you discuss any questions you have with your health care provider. Document Revised: 02/14/2019 Document Reviewed: 11/22/2016 Elsevier Patient Education  2021 Elsevier Inc.  

## 2020-06-13 ENCOUNTER — Other Ambulatory Visit: Payer: Self-pay

## 2020-06-13 ENCOUNTER — Encounter: Payer: Self-pay | Admitting: Family Medicine

## 2020-06-13 ENCOUNTER — Ambulatory Visit (INDEPENDENT_AMBULATORY_CARE_PROVIDER_SITE_OTHER): Payer: Medicaid Other | Admitting: Family Medicine

## 2020-06-13 VITALS — BP 124/90 | HR 70 | Ht 63.0 in | Wt 194.0 lb

## 2020-06-13 DIAGNOSIS — R1030 Lower abdominal pain, unspecified: Secondary | ICD-10-CM

## 2020-06-13 LAB — URINE CULTURE

## 2020-06-13 NOTE — Progress Notes (Signed)
    SUBJECTIVE:   CHIEF COMPLAINT / HPI: Check in   Ms Over is a 40 year old female presenting for check-in and to discuss the following:  Chronic bilateral pelvic pain/adnexal cysts: She previously had a right-sided hemorrhagic cyst however will repeat ultrasound on 5/31 showing resolution with bilateral small cyst remaining.  Did not recommend any follow-up imaging.  She saw OB/GYN again on 6/1 who recommended trial of amitriptyline and MiraLAX.  She has not started these medications as she wanted to verify with me first that they would be okay with her known renal disease.  She is still having intermittent bilateral lower abdominal pain. L>R whenever she places pressure in the area, long periods of sitting down, or walking frequently.  It is slowly getting better but still present.  Denies any dysuria, nausea, vomiting, change in BM.  Having a soft BM usually every day, can vary.  PERTINENT  PMH / PSH: CKD stage IV, hypertension, chronic cough  OBJECTIVE:   BP 124/90   Pulse 70   Ht '5\' 3"'$  (1.6 m)   Wt 194 lb (88 kg)   SpO2 98%   BMI 34.37 kg/m   General: Alert, NAD HEENT: NCAT, MMM Lungs: No increased WOB  Abdomen: soft, mild tenderness to palpation of left lower quadrant with palpation, non-distended, no masses palpated.  No overlying rash or ecchymoses in region of discomfort. [id number] gait  Ext: Warm, dry, 2+ distal pulses  ASSESSMENT/PLAN:   Lower abdominal pain Several month history, slight improvement since onset.  Follow-up U/S with resolution of hemorrhagic cyst, now with small bilateral ovarian cysts that would be less likely to cause her current pain.  She also has had numerous CTs without any additional acute intra-abdominal pathology other than intermittent ovarian cysts.  No current urinary symptoms, recent UC <100,000 colonies. currently following with OB/GYN, provided reassurance that she may start amitriptyline and MiraLax to assess for improvement.  Titrate  MiraLax with goal of consistently 1-2 soft BMs daily.    Follow-up with myself and OB/GYN in the next several weeks to check-in.  Scheduled with myself on 6/27 during appointment, sooner if needed.  Zoe Woodard, [place]

## 2020-06-13 NOTE — Patient Instructions (Signed)
Wonderful to see you! You may start the new medication and miralax. Goal is 1-2 soft bowel movements daily. If watery, cut back to every other day or less depending on severity.   Lets see how this does for the next month.

## 2020-06-16 ENCOUNTER — Encounter: Payer: Self-pay | Admitting: Family Medicine

## 2020-06-16 NOTE — Assessment & Plan Note (Addendum)
Several month history, slight improvement since onset.  Follow-up U/S with resolution of hemorrhagic cyst, now with small bilateral ovarian cysts that would be less likely to cause her current pain.  She also has had numerous CTs without any additional acute intra-abdominal pathology other than intermittent ovarian cysts.  No current urinary symptoms, recent UC <100,000 colonies. currently following with OB/GYN, provided reassurance that she may start amitriptyline and MiraLax to assess for improvement.  Titrate MiraLax with goal of consistently 1-2 soft BMs daily.

## 2020-06-22 ENCOUNTER — Other Ambulatory Visit: Payer: Self-pay | Admitting: Family Medicine

## 2020-06-22 DIAGNOSIS — R053 Chronic cough: Secondary | ICD-10-CM

## 2020-06-24 NOTE — Progress Notes (Signed)
[facility] CONSULT NOTE  Patient Care Team: [name], DO as PCP - General (Family Medicine)  CHIEF COMPLAINTS/PURPOSE OF CONSULTATION:  Leukocytosis  ASSESSMENT & PLAN:   No problem-specific Assessment & Plan notes found for this encounter.  Orders Placed This Encounter  Procedures   CBC with Differential/Platelet    Standing Status:   Standing    Number of Occurrences:   22    Standing Expiration Date:   06/25/2021   Vitamin B12    Standing Status:   Future    Standing Expiration Date:   06/25/2021   CMP ([facility] only)    Standing Status:   Future    Standing Expiration Date:   06/25/2021    This is a pleasant 40 year old female patient with past medical history significant for hypertension, dyslipidemia referred to hematology for evaluation of persistent leukocytosis for the past several years.   1.  Leukocytosis with neutrophilia and thrombocytosis. MPN work up negative.  Patient is here for a follow-up. Since last visit, no new health complaints.  We will repeat labs today to see if the leukocytosis has stabilized.  If in case the leukocytosis is stable, given negative MPN work-up, we will follow-up in a few months.  If she continues to have worsening leukocytosis, we can consider bone marrow aspiration and biopsy for further work-up.  2.  B12 deficiency, she was started on B12 supplementation 1000 mcg daily.  Repeat labs today  3.  Anemia, normocytic, normochromic likely related to chronic kidney disease we will follow-up on labs today.  4.  Slightly elevated alkaline phosphatase, continue to monitor this on future labs, CMP ordered and pending.  HISTORY OF PRESENTING ILLNESS:   Zoe Woodard 40 y.o. female is here because of leukocytosis.  This is a very pleasant 40 year old healthy female patient with past medical history significant for hypertension, chronic kidney disease and dyslipidemia referred to hematology for evaluation of  persistent leukocytosis.    Interval History  This is a very pleasant 40 year old female patient with past medical history significant for chronic kidney disease likely secondary to hypertension referred to hematology initially for evaluation of leukocytosis and thrombocytosis.  And during her initial visit, we have proceeded with myeloproliferative work-up which was unremarkable hence we have discussed about surveillance which she was very comfortable with.  She is here for follow-up.  No new B symptoms.  She has chronic cough for which she has seen pulmonology, thought to be related to allergies and chronic reflux.  She is already taking medication for reflux, started on benzonatate capsules because the cough was worse in the past week.  No other associated pulmonary symptoms. Some intermittent constipation, takes as needed laxatives.  No other changes in her health.  Rest of the pertinent 10 point ROS reviewed and negative.   MEDICAL HISTORY:  Past Medical History:  Diagnosis Date   Abdominal pain 12/28/2019   Back pain 10/10/2019   Chronic kidney disease    Ear pain, bilateral 04/15/2020   Hyperlipidemia    Hypertensive urgency 07/22/2019   Hypocalcemia 08/22/2019   Low blood pressure 02/07/2020   Obesity    Pyelonephritis 11/08/2019    SURGICAL HISTORY: Past Surgical History:  Procedure Laterality Date   ABDOMINAL HYSTERECTOMY     APPENDECTOMY     bladder laceration  01/04/2015   excision of thyroglobal duct cyst  02/01/2012   right ovarian cystoectomy  01/02/2015   right ovarian egstectomy  04/27/2013   SPINE SURGERY  TONSILLECTOMY     TUBAL LIGATION      SOCIAL HISTORY: Social History   Socioeconomic History   Marital status: Single    Spouse name: Not on file   Number of children: Not on file   Years of education: Not on file   Highest education level: Not on file  Occupational History   Not on file  Tobacco Use   Smoking status: Never   Smokeless tobacco: Never   Vaping Use   Vaping Use: Never used  Substance and Sexual Activity   Alcohol use: No   Drug use: No   Sexual activity: Not Currently    Partners: Male    Birth control/protection: Surgical  Other Topics Concern   Not on file  Social History Narrative   Lives with 3 children    Not working currently   Not sexually active.    Social Determinants of Health   Financial Resource Strain: Not on file  Food Insecurity: Not on file  Transportation Needs: Not on file  Physical Activity: Not on file  Stress: Not on file  Social Connections: Not on file  Intimate Partner Violence: Not on file    FAMILY HISTORY: Family History  Problem Relation Age of Onset   Diabetes Mother    Healthy Father     ALLERGIES:  has No Known Allergies.  MEDICATIONS:  Current Outpatient Medications  Medication Sig Dispense Refill   amitriptyline (ELAVIL) 10 MG tablet Take 1 tablet (10 mg total) by mouth at bedtime. 30 tablet 2   amLODipine (NORVASC) 10 MG tablet TAKE 1 TABLET BY MOUTH EVERYDAY AT BEDTIME 30 tablet 3   benzonatate (TESSALON) 100 MG capsule TAKE 1 CAPSULE BY MOUTH 2 TIMES DAILY AS NEEDED FOR COUGH. 20 capsule 0   famotidine (PEPCID) 20 MG tablet TAKE 1 TABLET BY MOUTH TWICE A DAY 60 tablet 3   loratadine (CLARITIN) 10 MG tablet Take 1 tablet (10 mg total) by mouth daily. 30 tablet 11   metoprolol tartrate (LOPRESSOR) 25 MG tablet TAKE 1 TABLET BY MOUTH TWICE A DAY 60 tablet 1   montelukast (SINGULAIR) 10 MG tablet TAKE 1 TABLET BY MOUTH EVERYDAY AT BEDTIME 30 tablet 3   polyethylene glycol powder (GLYCOLAX/MIRALAX) 17 GM/SCOOP powder Take 17 g by mouth daily as needed. 510 g 1   rosuvastatin (CRESTOR) 10 MG tablet Take 1 tablet (10 mg total) by mouth every other day. (Patient taking differently: Take 10 mg by mouth every other day. Takes on M-W-F) 90 tablet 1   vitamin B-12 (CYANOCOBALAMIN) 1000 MCG tablet Take 1,000 mcg by mouth daily.     No current facility-administered medications  for this visit.     PHYSICAL EXAMINATION: ECOG PERFORMANCE STATUS: 0 - Asymptomatic  Vitals:   06/25/20 1045  BP: 117/84  Pulse: 81  Resp: 17  Temp: 99.2 F (37.3 C)  SpO2: 100%    Filed Weights   06/25/20 1045  Weight: 194 lb 8 oz (88.2 kg)   Physical Exam Constitutional:      Appearance: Normal appearance.  HENT:     Head: Normocephalic and atraumatic.  Cardiovascular:     Rate and Rhythm: Normal rate and regular rhythm.  Pulmonary:     Effort: Pulmonary effort is normal.     Breath sounds: Normal breath sounds.  Abdominal:     General: Abdomen is flat. Bowel sounds are normal. There is no distension.  Musculoskeletal:        General: No swelling or  tenderness.     Cervical back: Normal range of motion and neck supple.  Skin:    General: Skin is warm and dry.  Neurological:     General: No focal deficit present.     Mental Status: She is alert.  Psychiatric:        Mood and Affect: Mood normal.     LABORATORY DATA:  I have reviewed the data as listed Lab Results  Component Value Date   WBC 15.2 (H) 03/14/2020   HGB 11.7 (L) 03/14/2020   HCT 36.6 03/14/2020   MCV 84.3 03/14/2020   PLT 411 (H) 03/14/2020     Chemistry      Component Value Date/Time   NA 140 02/21/2020 1051   NA 139 02/07/2020 1116   K 4.1 02/21/2020 1051   CL 107 02/21/2020 1051   CO2 23 02/21/2020 1051   BUN 40 (H) 02/21/2020 1051   BUN 27 (H) 02/07/2020 1116   CREATININE 2.10 (H) 02/21/2020 1051      Component Value Date/Time   CALCIUM 8.9 02/21/2020 1051   ALKPHOS 150 (H) 02/21/2020 1051   AST 12 (L) 02/21/2020 1051   ALT 13 02/21/2020 1051   BILITOT 0.3 02/21/2020 1051       RADIOGRAPHIC STUDIES: I have personally reviewed the radiological images as listed and agreed with the findings in the report. US PELVIC COMPLETE WITH TRANSVAGINAL  Result Date: 06/10/2020 CLINICAL DATA:  Follow-up ovarian cyst, prior cystectomy x2 , hysterectomy EXAM: TRANSABDOMINAL AND  TRANSVAGINAL ULTRASOUND OF PELVIS TECHNIQUE: Both transabdominal and transvaginal ultrasound examinations of the pelvis were performed. Transabdominal technique was performed for global imaging of the pelvis including uterus, ovaries, adnexal regions, and pelvic cul-de-sac. It was necessary to proceed with endovaginal exam following the transabdominal exam to visualize the ovaries. COMPARISON:  None FINDINGS: Uterus Surgically absent Endometrium N/A Right ovary Measurements: 3.7 x 2.0 x 2.5 cm = volume: 9.6 mL. Small cyst with a single thin septation within RIGHT ovary 2.1 cm greatest size; no follow-up imaging recommended. Left ovary Measurements: 3.1 x 2.7 x 2.5 cm = volume: 10.7 mL. Small cyst within LEFT ovary 1.8 cm diameter; no follow-up imaging recommended. Other findings No free pelvic fluid.  No adnexal masses. IMPRESSION: Post hysterectomy. Small cysts within both ovaries as above; no followup imaging recommended. Note: This recommendation does not apply to premenarchal patients or to those with increased risk (genetic, family history, elevated tumor markers or other high-risk factors) of ovarian cancer. Reference: Radiology 2019 Nov; 293(2):359-371. Electronically Signed   By: Lavonia Dana M.D.   On: 06/10/2020 10:35     All questions were answered. The patient knows to call the clinic with any problems, questions or concerns.     Benay Pike, MD 06/25/2020 11:05 AM

## 2020-06-25 ENCOUNTER — Encounter: Payer: Self-pay | Admitting: Hematology and Oncology

## 2020-06-25 ENCOUNTER — Inpatient Hospital Stay: Payer: Medicaid Other | Attending: Hematology and Oncology | Admitting: Hematology and Oncology

## 2020-06-25 ENCOUNTER — Other Ambulatory Visit: Payer: Self-pay

## 2020-06-25 ENCOUNTER — Inpatient Hospital Stay: Payer: Medicaid Other

## 2020-06-25 VITALS — BP 117/84 | HR 81 | Temp 99.2°F | Resp 17 | Wt 194.5 lb

## 2020-06-25 DIAGNOSIS — D649 Anemia, unspecified: Secondary | ICD-10-CM

## 2020-06-25 DIAGNOSIS — D72825 Bandemia: Secondary | ICD-10-CM | POA: Diagnosis not present

## 2020-06-25 DIAGNOSIS — I129 Hypertensive chronic kidney disease with stage 1 through stage 4 chronic kidney disease, or unspecified chronic kidney disease: Secondary | ICD-10-CM | POA: Diagnosis not present

## 2020-06-25 DIAGNOSIS — E785 Hyperlipidemia, unspecified: Secondary | ICD-10-CM | POA: Insufficient documentation

## 2020-06-25 DIAGNOSIS — D75839 Thrombocytosis, unspecified: Secondary | ICD-10-CM | POA: Diagnosis not present

## 2020-06-25 DIAGNOSIS — N189 Chronic kidney disease, unspecified: Secondary | ICD-10-CM | POA: Diagnosis not present

## 2020-06-25 DIAGNOSIS — D72829 Elevated white blood cell count, unspecified: Secondary | ICD-10-CM | POA: Insufficient documentation

## 2020-06-25 LAB — CBC WITH DIFFERENTIAL/PLATELET
Abs Immature Granulocytes: 0.09 10*3/uL — ABNORMAL HIGH (ref 0.00–0.07)
Basophils Absolute: 0.1 10*3/uL (ref 0.0–0.1)
Basophils Relative: 0 %
Eosinophils Absolute: 0.1 10*3/uL (ref 0.0–0.5)
Eosinophils Relative: 1 %
HCT: 35.9 % — ABNORMAL LOW (ref 36.0–46.0)
Hemoglobin: 11.7 g/dL — ABNORMAL LOW (ref 12.0–15.0)
Immature Granulocytes: 1 %
Lymphocytes Relative: 27 %
Lymphs Abs: 4.4 10*3/uL — ABNORMAL HIGH (ref 0.7–4.0)
MCH: 26.5 pg (ref 26.0–34.0)
MCHC: 32.6 g/dL (ref 30.0–36.0)
MCV: 81.2 fL (ref 80.0–100.0)
Monocytes Absolute: 1 10*3/uL (ref 0.1–1.0)
Monocytes Relative: 6 %
Neutro Abs: 10.8 10*3/uL — ABNORMAL HIGH (ref 1.7–7.7)
Neutrophils Relative %: 65 %
Platelets: 382 10*3/uL (ref 150–400)
RBC: 4.42 MIL/uL (ref 3.87–5.11)
RDW: 13.9 % (ref 11.5–15.5)
WBC: 16.4 10*3/uL — ABNORMAL HIGH (ref 4.0–10.5)
nRBC: 0 % (ref 0.0–0.2)

## 2020-06-25 LAB — CMP (CANCER CENTER ONLY)
ALT: 30 U/L (ref 0–44)
AST: 20 U/L (ref 15–41)
Albumin: 3.3 g/dL — ABNORMAL LOW (ref 3.5–5.0)
Alkaline Phosphatase: 156 U/L — ABNORMAL HIGH (ref 38–126)
Anion gap: 11 (ref 5–15)
BUN: 19 mg/dL (ref 6–20)
CO2: 24 mmol/L (ref 22–32)
Calcium: 8.8 mg/dL — ABNORMAL LOW (ref 8.9–10.3)
Chloride: 107 mmol/L (ref 98–111)
Creatinine: 2.01 mg/dL — ABNORMAL HIGH (ref 0.44–1.00)
GFR, Estimated: 31 mL/min — ABNORMAL LOW (ref >60)
Glucose, Bld: 97 mg/dL (ref 70–99)
Potassium: 3.8 mmol/L (ref 3.5–5.1)
Sodium: 142 mmol/L (ref 135–145)
Total Bilirubin: 0.3 mg/dL (ref 0.3–1.2)
Total Protein: 7.2 g/dL (ref 6.5–8.1)

## 2020-06-25 LAB — VITAMIN B12: Vitamin B-12: 803 pg/mL (ref 180–914)

## 2020-06-26 ENCOUNTER — Telehealth: Payer: Self-pay | Admitting: Hematology and Oncology

## 2020-06-26 NOTE — Telephone Encounter (Signed)
Scheduled appointment per 06/16 sch msg, left message.

## 2020-07-07 ENCOUNTER — Ambulatory Visit (INDEPENDENT_AMBULATORY_CARE_PROVIDER_SITE_OTHER): Payer: Medicaid Other | Admitting: Family Medicine

## 2020-07-07 ENCOUNTER — Ambulatory Visit (HOSPITAL_COMMUNITY): Admission: RE | Admit: 2020-07-07 | Payer: Medicaid Other | Source: Ambulatory Visit

## 2020-07-07 ENCOUNTER — Other Ambulatory Visit: Payer: Self-pay

## 2020-07-07 ENCOUNTER — Encounter: Payer: Self-pay | Admitting: Family Medicine

## 2020-07-07 VITALS — BP 108/79 | HR 91 | Wt 196.4 lb

## 2020-07-07 DIAGNOSIS — R053 Chronic cough: Secondary | ICD-10-CM

## 2020-07-07 DIAGNOSIS — D72829 Elevated white blood cell count, unspecified: Secondary | ICD-10-CM | POA: Diagnosis not present

## 2020-07-07 DIAGNOSIS — R1030 Lower abdominal pain, unspecified: Secondary | ICD-10-CM

## 2020-07-07 DIAGNOSIS — R519 Headache, unspecified: Secondary | ICD-10-CM

## 2020-07-07 DIAGNOSIS — R0789 Other chest pain: Secondary | ICD-10-CM

## 2020-07-07 DIAGNOSIS — I129 Hypertensive chronic kidney disease with stage 1 through stage 4 chronic kidney disease, or unspecified chronic kidney disease: Secondary | ICD-10-CM | POA: Diagnosis not present

## 2020-07-07 DIAGNOSIS — E785 Hyperlipidemia, unspecified: Secondary | ICD-10-CM | POA: Diagnosis not present

## 2020-07-07 DIAGNOSIS — I10 Essential (primary) hypertension: Secondary | ICD-10-CM

## 2020-07-07 DIAGNOSIS — N189 Chronic kidney disease, unspecified: Secondary | ICD-10-CM | POA: Diagnosis not present

## 2020-07-07 MED ORDER — DICLOFENAC SODIUM 1 % EX GEL: 4.0000 g | Freq: Four times a day (QID) | CUTANEOUS | 1 refills | Status: DC

## 2020-07-07 NOTE — Progress Notes (Signed)
SUBJECTIVE:   CHIEF COMPLAINT / HPI: Chest pain/check in   Chest pain: Occurred Saturday morning around 6 am while she was sleeping. "Thumping so hard like out of my chest, it hurt really bad". Left sided. Pain woke her up out of her sleep. Lasted about 10 minutes. She went to the restroom and then was able to fall back asleep, however then it happened again and woke her up. Now since has been having some pain on the right chest, intermittent and random. None currently. Not sure what she ate the night before. No associated SOB or diaphoresis. Coughing more in the past two weeks in the setting of chronic cough, no fever. She hasn't been doing much activity prior to this, cleans her house and walking up/down stairs, no CP/SOB with this. No early FH of heart disease.   Abdominal pain: Much better. Felt like twice this month. Taking miralax, having soft stools. Medicaid didn't cover elavil and so she never started it.   Headaches: Daily for the past two weeks it seems. She has a history of headaches, seem to come and go in spurts. Bilateral temples, pressure like. Non-tender to pressure on her scalp. Tylenol makes it much better, sometimes resolves. Seem to come on mainly in the afternoon. Sleeping well. Eating/drinking as normal/okay. No caffeine. Denies any blurred vision, weakness, numbness, or lightheadedness/dizziness.    07/22/2019 Echo:  Impression  Normal left ventricular chamber size with mild concentric LVH and  normal systolic wall motion.    Preserved LVEF 60-65%.    Indeterminate diastolic dysfunction.    Pulmonary artery systolic pressure cannot be determined.    There is a trivial circumferential pericardial effusion noted anterior  and posterior to the heart.    No prior echocardiogram report is available for comparison.  PERTINENT  PMH / PSH: Chronic cough, CKD stage IIIb-4  OBJECTIVE:   BP 108/79   Pulse 91   Wt 196 lb 6.4 oz (89.1 kg)   SpO2 98%   BMI 34.79 kg/m    General: Alert, NAD HEENT: NCAT, MMM, no tenderness to palpation of temporal region Cardiac: RRR no m/g/r, tender to palpation of left chest Lungs: Clear bilaterally, no increased WOB on room air, no cough during evaluation Abdomen: soft, non-tender Ext: Warm, dry, 2+ distal pulses, no edema  Neuro: Alert and oriented.  CN II-XII intact.  Able to move all extremities spontaneously and equally.  5/5 upper and lower extremity strength.  EOMI, PERRLA.  Normal gait.   ASSESSMENT/PLAN:   Atypical chest pain Pain awoke her out of sleep, however fortunately resolved on its own.  This is not her first occurrence of chest pain and additionally had atypical chest pain while walking a few months prior.  EKG NSR and similar to previous, no ischemic changes.  Certainly considering costochondritis given tenderness to palpation with coughing, however she has been sore in this region before and not quite sure if that is more chronic.  Encouraged ice/heat and Voltaren gel/Biofreeze in the area. Reassuring echo in 2021. However, given her risk factors including advanced CKD, HTN (previously severely uncontrolled, possible contributor to CKD), and elevated BMI, will refer to cardiology for additional evaluation.  Lower abdominal pain Fortunately significantly improved.  Anticipate that underlying constipation may have been contributing.  Will monitor.  Follow-up with OB/GYN on 6/29.  Bilateral headaches Mild and mainly resolve with Tylenol.  Neurologically intact on exam.  Suspect likely tension related, however may be multifactorial.  Encouraged adequate hydration, massage,  and Tylenol as needed.  Follow-up if any alarm s/x or not improving with regimen as above.   Chronic cough Will increase Pepcid to twice daily.  Continue loratadine for allergies.  May use Tessalon as needed for intermittent cough, may need to find alternative agent as her insurance is no longer covering this.    Follow-up in 1 month with  new PCP or sooner if needed.  [name], [place]

## 2020-07-07 NOTE — Patient Instructions (Addendum)
Increase famotidine back up to twice daily.   You can use heat (or ice) 20 minutes at a  time several times a day on your chest. You can also try Voltaren get up to 4 times daily.   If you have recurrent chest pain, please be seen in the ED. Keep drinking fluids.   We will place a referral to cardiology.

## 2020-07-08 ENCOUNTER — Encounter: Payer: Self-pay | Admitting: Family Medicine

## 2020-07-08 ENCOUNTER — Other Ambulatory Visit: Payer: Self-pay | Admitting: Family Medicine

## 2020-07-08 DIAGNOSIS — R053 Chronic cough: Secondary | ICD-10-CM

## 2020-07-08 DIAGNOSIS — R519 Headache, unspecified: Secondary | ICD-10-CM | POA: Insufficient documentation

## 2020-07-08 DIAGNOSIS — I10 Essential (primary) hypertension: Secondary | ICD-10-CM

## 2020-07-08 NOTE — Assessment & Plan Note (Signed)
Mild and mainly resolve with Tylenol.  Neurologically intact on exam.  Suspect likely tension related, however may be multifactorial.  Encouraged adequate hydration, massage, and Tylenol as needed.  Follow-up if any alarm s/x or not improving with regimen as above.

## 2020-07-08 NOTE — Assessment & Plan Note (Signed)
Will increase Pepcid to twice daily.  Continue loratadine for allergies.  May use Tessalon as needed for intermittent cough, may need to find alternative agent as her insurance is no longer covering this.

## 2020-07-08 NOTE — Assessment & Plan Note (Signed)
Fortunately significantly improved.  Anticipate that underlying constipation may have been contributing.  Will monitor.  Follow-up with OB/GYN on 6/29.

## 2020-07-08 NOTE — Assessment & Plan Note (Addendum)
Pain awoke her out of sleep, however fortunately resolved on its own.  This is not her first occurrence of chest pain and additionally had atypical chest pain while walking a few months prior.  EKG NSR and similar to previous, no ischemic changes.  Certainly considering costochondritis given tenderness to palpation with coughing, however she has been sore in this region before and not quite sure if that is more chronic.  Encouraged ice/heat and Voltaren gel/Biofreeze in the area. Reassuring echo in 2021. However, given her risk factors including advanced CKD, HTN (previously severely uncontrolled, possible contributor to CKD), and elevated BMI, will refer to cardiology for additional evaluation.

## 2020-07-09 ENCOUNTER — Ambulatory Visit: Payer: Medicaid Other | Admitting: Women's Health

## 2020-07-09 ENCOUNTER — Ambulatory Visit (INDEPENDENT_AMBULATORY_CARE_PROVIDER_SITE_OTHER): Payer: Medicaid Other | Admitting: Family Medicine

## 2020-07-09 ENCOUNTER — Encounter: Payer: Self-pay | Admitting: Family Medicine

## 2020-07-09 ENCOUNTER — Other Ambulatory Visit: Payer: Self-pay

## 2020-07-09 DIAGNOSIS — R1030 Lower abdominal pain, unspecified: Secondary | ICD-10-CM | POA: Diagnosis not present

## 2020-07-09 NOTE — Assessment & Plan Note (Signed)
Diagnostic/therapeutic trial of miralax makes this most likely etiology of her pain given improved with that medicine alone and only sporadic use of amitryptiline. Cont miralax daily, titrate to effect. Follow up PRN.

## 2020-07-09 NOTE — Progress Notes (Signed)
Denies abdominal pain today.Clarnce Flock primary care yesterday with reports of CP, HA, Shoulder pain: awaiting f/u cardiology.

## 2020-07-09 NOTE — Progress Notes (Signed)
GYNECOLOGY OFFICE VISIT NOTE  History:   Zoe Woodard is a 40 y.o. G3P0 here today for follow up of abdominal pain.  From my last HPI: " Zoe Woodard is a 40 y.o. G3P0 with hx of TAH and R ovarian cystectomy here today for follow up of [place].   Seen Dr. Elly Modena on 05/19/2020 At that time reported seeking f/u for TVUS from 04/2020 which showed ovarian cyst Also had CT A/P at that time which showed hemorrhagic R ovarian cyst and diverticulosis Reassurance provided, had repeat TVUS to ensure resolution yesterday which showed small bilateral cysts measuring 1.8 and 2.1 cm respectively   Today reports pain has not subsided Takes tylenol and it goes down but not completely Pain lasts for hours Does not think she is constipated Worried it might be her ovaries and that she might need another surgery Also hurts when she has to urinate. "  And plan: " Lower abdominal pain - Primary       Reviewed follow up US showing resolution of hemorrhagic cyst. Also reviewed prior imaging with patient. She is concerned ongoing pain is related to her ovaries, I discussed with her that I think this is unlikely given small size of the cysts. In addition she reports initial pain was L sided while hemorrhagic cyst was R sided, and she endorses symptoms with urination. Other possibilities include constipation/diverticulosis (which was seen on CT) and interstitial cystitis. Recommend we obtain urine culture and do diagnostic/therapeutic trial of miralax and amitriptyline, follow up in 4 weeks.    " Urine culture resulted negative  Today reports pain is significantly better, has only had 2 or 3 times since last visit Has been taking miralax faithfully but only intermittently using the amytriptiline as she thought it was a PRN pain medication  Health Maintenance Due  Topic Date Due   COVID-19 Vaccine (1) Never done   Pneumococcal Vaccine 84-33 Years old (1 - PCV) Never done   HIV Screening  Never done    Hepatitis C Screening  Never done   TETANUS/TDAP  Never done    Past Medical History:  Diagnosis Date   Abdominal pain 12/28/2019   Back pain 10/10/2019   Chronic kidney disease    Ear pain, bilateral 04/15/2020   Hyperlipidemia    Hypertensive urgency 07/22/2019   Hypocalcemia 08/22/2019   Low blood pressure 02/07/2020   Obesity    Pyelonephritis 11/08/2019    Past Surgical History:  Procedure Laterality Date   ABDOMINAL HYSTERECTOMY     APPENDECTOMY     bladder laceration  01/04/2015   excision of thyroglobal duct cyst  02/01/2012   right ovarian cystoectomy  01/02/2015   right ovarian egstectomy  04/27/2013   SPINE SURGERY     TONSILLECTOMY     TUBAL LIGATION      The following portions of the patient's history were reviewed and updated as appropriate: allergies, current medications, past family history, past medical history, past social history, past surgical history and problem list.   Health Maintenance:   Last pap: N/a, s/p hysterectomy  Last mammogram:  09/10/2019, scheduled for repeat 09/16/2020    Review of Systems:  Pertinent items noted in HPI and remainder of comprehensive ROS otherwise negative.  Physical Exam:  BP 114/79   Pulse 76   Ht '5\' 3"'$  (1.6 m)   Wt 195 lb 11.2 oz (88.8 kg)   BMI 34.67 kg/m  CONSTITUTIONAL: Well-developed, well-nourished female in no acute distress.  HEENT:  Normocephalic, atraumatic.  External right and left ear normal. No scleral icterus.  NECK: Normal range of motion, supple, no masses noted on observation SKIN: No rash noted. Not diaphoretic. No erythema. No pallor. MUSCULOSKELETAL: Normal range of motion. No edema noted. NEUROLOGIC: Alert and oriented to person, place, and time. Normal muscle tone coordination.  PSYCHIATRIC: Normal mood and affect. Normal behavior. Normal judgment and thought content. RESPIRATORY: Effort normal, no problems with respiration noted  Labs and Imaging No results found for this or any previous  visit (from the past 168 hour(s)). US PELVIC COMPLETE WITH TRANSVAGINAL  Result Date: 06/10/2020 CLINICAL DATA:  Follow-up ovarian cyst, prior cystectomy x2 , hysterectomy EXAM: TRANSABDOMINAL AND TRANSVAGINAL ULTRASOUND OF PELVIS TECHNIQUE: Both transabdominal and transvaginal ultrasound examinations of the pelvis were performed. Transabdominal technique was performed for global imaging of the pelvis including uterus, ovaries, adnexal regions, and pelvic cul-de-sac. It was necessary to proceed with endovaginal exam following the transabdominal exam to visualize the ovaries. COMPARISON:  None FINDINGS: Uterus Surgically absent Endometrium N/A Right ovary Measurements: 3.7 x 2.0 x 2.5 cm = volume: 9.6 mL. Small cyst with a single thin septation within RIGHT ovary 2.1 cm greatest size; no follow-up imaging recommended. Left ovary Measurements: 3.1 x 2.7 x 2.5 cm = volume: 10.7 mL. Small cyst within LEFT ovary 1.8 cm diameter; no follow-up imaging recommended. Other findings No free pelvic fluid.  No adnexal masses. IMPRESSION: Post hysterectomy. Small cysts within both ovaries as above; no followup imaging recommended. Note: This recommendation does not apply to premenarchal patients or to those with increased risk (genetic, family history, elevated tumor markers or other high-risk factors) of ovarian cancer. Reference: Radiology 2019 Nov; 293(2):359-371. Electronically Signed   By: Lavonia Dana M.D.   On: 06/10/2020 10:35      Assessment and Plan:   Problem List Items Addressed This Visit       Other   Lower abdominal pain    Diagnostic/therapeutic trial of miralax makes this most likely etiology of her pain given improved with that medicine alone and only sporadic use of amitryptiline. Cont miralax daily, titrate to effect. Follow up PRN.         Routine preventative health maintenance measures emphasized. Please refer to After Visit Summary for other counseling recommendations.   Return if  symptoms worsen or fail to improve.    Total face-to-face time with patient: 15 minutes.  Over 50% of encounter was spent on counseling and coordination of care.   Clarnce Flock, MD/MPH Attending Family Medicine Physician, [facility] for [facility], [place]

## 2020-08-05 NOTE — Progress Notes (Signed)
    SUBJECTIVE:   CHIEF COMPLAINT / HPI: Follow-up for chronic cough  Upper Chest Pain  Patient reports two episodes while waiting to be seen.  Feels sharp and moves to right and left upper pec regions  The first time it woke her from sleeping in ?march and it hurt when she returned from a walk  She did not notice any nausea associated with the chest pain  She is unsure if it was associated with SOB  Pain usually lasts a few minutes and she will often have left arm pain that travels from her elbow up Pain improves with relaxing and a massage of her soulder, she will often lie down and try to relax She denies active chest pain  Denies heart burn symptoms  Has been referred to cards with appt in Sept   Myalgias associated with statin therapy Patient reports that she has been having reemergence of her cramping associated with her rosuvastatin.  She reports that she has been taking rosuvastatin on Monday Wednesdays and Fridays.  Recently started having the muscle cramping again.  She reports that she has renal issues, stage IV and is careful about what medications she can take.   PERTINENT  PMH / PSH:    OBJECTIVE:   BP 117/75   Pulse 75   Ht '5\' 3"'$  (1.6 m)   Wt 194 lb 6.4 oz (88.2 kg)   SpO2 98%   BMI 34.44 kg/m   General: female appearing stated age in no acute distress Cardio: Normal S1 and S2, no S3 or S4. Rhythm is regular. No murmurs or rubs.  Bilateral radial pulses palpable MSK: no tenderness to palpation of pectoral muscles in area patient localizes pain, normal upper extremity strength bilaterally, 5/5 Pulm: Clear to auscultation bilaterally, no crackles, wheezing, or diminished breath sounds. Normal respiratory effort Abdomen: Bowel sounds normal. Abdomen soft and non-tender.  Extremities: No peripheral edema.    ASSESSMENT/PLAN:   Myalgia due to statin Patient reports that she has been taking her statin therapy every Monday Wednesday Friday as directed.  She  reports that she is started to have myalgias again. -We will hold statin therapy and have patient follow-up in 2-4 weeks -Patient will likely need referral to lipid clinic in addition to schedule cardiology evaluation  Atypical chest pain Patient reports intermittent chest pain that occurs in her superior pectoral regions.  She denies that this pain is associated with exertion and usually resolves after few moments.  Patient reports that she had 2 episodes while waiting for the visit to be continue.  Episodes normally resolve after few minutes.  Patient has a visit with cardiology scheduled for September.  Review of EKG on 6/27 did not show any signs of ischemia or ST segment changes.  Patient has hypertension and CKD with hyperlipidemia history. Recommended the patient follow-up with cardiology as scheduled.  We will have patient follow-up in 2-3 weeks.     Eulis Foster, MD [place]

## 2020-08-06 ENCOUNTER — Encounter: Payer: Self-pay | Admitting: Family Medicine

## 2020-08-06 ENCOUNTER — Other Ambulatory Visit: Payer: Self-pay

## 2020-08-06 ENCOUNTER — Ambulatory Visit: Payer: Medicaid Other | Admitting: Family Medicine

## 2020-08-06 VITALS — BP 117/75 | HR 75 | Ht 63.0 in | Wt 194.4 lb

## 2020-08-06 DIAGNOSIS — M791 Myalgia, unspecified site: Secondary | ICD-10-CM

## 2020-08-06 DIAGNOSIS — T466X5A Adverse effect of antihyperlipidemic and antiarteriosclerotic drugs, initial encounter: Secondary | ICD-10-CM

## 2020-08-06 DIAGNOSIS — R0789 Other chest pain: Secondary | ICD-10-CM

## 2020-08-06 DIAGNOSIS — E782 Mixed hyperlipidemia: Secondary | ICD-10-CM | POA: Diagnosis not present

## 2020-08-06 DIAGNOSIS — Z1159 Encounter for screening for other viral diseases: Secondary | ICD-10-CM

## 2020-08-06 DIAGNOSIS — Z114 Encounter for screening for human immunodeficiency virus [HIV]: Secondary | ICD-10-CM

## 2020-08-06 MED ORDER — NITROGLYCERIN 0.4 MG SL SUBL: 0.4000 mg | SUBLINGUAL_TABLET | SUBLINGUAL | 0 refills | Status: DC | PRN

## 2020-08-06 NOTE — Assessment & Plan Note (Addendum)
Patient reports intermittent chest pain that occurs in her superior pectoral regions.  She denies that this pain is associated with exertion and usually resolves after few moments.  Patient reports that she had 2 episodes while waiting for the visit to be continue.  Episodes normally resolve after few minutes.  Patient has a visit with cardiology scheduled for September.  Review of EKG on 6/27 did not show any signs of ischemia or ST segment changes.  Patient has hypertension and CKD with hyperlipidemia history. Recommended the patient follow-up with cardiology as scheduled.  We will have patient follow-up in 2-3 weeks.

## 2020-08-06 NOTE — Assessment & Plan Note (Signed)
Patient reports that she has been taking her statin therapy every Monday Wednesday Friday as directed.  She reports that she is started to have myalgias again. -We will hold statin therapy and have patient follow-up in 2-4 weeks -Patient will likely need referral to lipid clinic in addition to schedule cardiology evaluation

## 2020-08-06 NOTE — Patient Instructions (Addendum)
It was a pleasure to see you today!  Thank you for choosing [facility] for your primary care.   Zoe Woodard was seen for follow up chest pain.   Our plans for today were: I recommended she keep your appointment with cardiology as scheduled. We will ask you to stop taking your cholesterol medicine and plan to follow-up with me in 2-3 weeks to see if your muscle cramping has improved. We will check your cholesterol levels today and I will follow-up with you if there are any abnormal results.  We may need to refer you to a special cholesterol clinic if you are unable to tolerate medications to control your cholesterol.  To keep you healthy, please keep in mind the following health maintenance items that you are due for:   COVID-vaccine Tetanus vaccine     You should return to our clinic in 2-4 weeks for chest pain follow up and cholesterol.   Best Wishes,   Dr. Alba Cory

## 2020-08-07 LAB — HEPATITIS C ANTIBODY: Hep C Virus Ab: 0.2 s/co ratio (ref 0.0–0.9)

## 2020-08-07 LAB — LIPID PANEL
Chol/HDL Ratio: 4.9 ratio — ABNORMAL HIGH (ref 0.0–4.4)
Cholesterol, Total: 177 mg/dL (ref 100–199)
HDL: 36 mg/dL — ABNORMAL LOW (ref >39)
LDL Chol Calc (NIH): 62 mg/dL (ref 0–99)
Triglycerides: 523 mg/dL — ABNORMAL HIGH (ref 0–149)
VLDL Cholesterol Cal: 79 mg/dL — ABNORMAL HIGH (ref 5–40)

## 2020-08-07 LAB — HIV ANTIBODY (ROUTINE TESTING W REFLEX): HIV Screen 4th Generation wRfx: NONREACTIVE

## 2020-08-09 ENCOUNTER — Encounter: Payer: Self-pay | Admitting: Family Medicine

## 2020-08-09 ENCOUNTER — Other Ambulatory Visit: Payer: Self-pay | Admitting: Family Medicine

## 2020-08-09 DIAGNOSIS — E781 Pure hyperglyceridemia: Secondary | ICD-10-CM

## 2020-08-09 MED ORDER — FENOFIBRATE 40 MG PO TABS: 1.0000 | ORAL_TABLET | Freq: Every day | ORAL | 1 refills | Status: DC

## 2020-08-11 ENCOUNTER — Inpatient Hospital Stay: Payer: Medicaid Other

## 2020-08-11 ENCOUNTER — Other Ambulatory Visit: Payer: Self-pay

## 2020-08-11 ENCOUNTER — Encounter: Payer: Self-pay | Admitting: Hematology and Oncology

## 2020-08-11 ENCOUNTER — Inpatient Hospital Stay: Payer: Medicaid Other | Attending: Hematology and Oncology | Admitting: Hematology and Oncology

## 2020-08-11 VITALS — BP 118/82 | HR 79 | Temp 97.8°F | Resp 18 | Wt 196.0 lb

## 2020-08-11 DIAGNOSIS — D649 Anemia, unspecified: Secondary | ICD-10-CM

## 2020-08-11 DIAGNOSIS — D75839 Thrombocytosis, unspecified: Secondary | ICD-10-CM | POA: Insufficient documentation

## 2020-08-11 DIAGNOSIS — N189 Chronic kidney disease, unspecified: Secondary | ICD-10-CM | POA: Insufficient documentation

## 2020-08-11 DIAGNOSIS — R079 Chest pain, unspecified: Secondary | ICD-10-CM | POA: Diagnosis not present

## 2020-08-11 DIAGNOSIS — E538 Deficiency of other specified B group vitamins: Secondary | ICD-10-CM | POA: Diagnosis not present

## 2020-08-11 DIAGNOSIS — D72825 Bandemia: Secondary | ICD-10-CM | POA: Diagnosis not present

## 2020-08-11 DIAGNOSIS — D631 Anemia in chronic kidney disease: Secondary | ICD-10-CM | POA: Insufficient documentation

## 2020-08-11 DIAGNOSIS — D72829 Elevated white blood cell count, unspecified: Secondary | ICD-10-CM

## 2020-08-11 LAB — CBC WITH DIFFERENTIAL (CANCER CENTER ONLY)
Abs Immature Granulocytes: 0.06 10*3/uL (ref 0.00–0.07)
Basophils Absolute: 0.1 10*3/uL (ref 0.0–0.1)
Basophils Relative: 0 %
Eosinophils Absolute: 0.2 10*3/uL (ref 0.0–0.5)
Eosinophils Relative: 2 %
HCT: 35.6 % — ABNORMAL LOW (ref 36.0–46.0)
Hemoglobin: 11.7 g/dL — ABNORMAL LOW (ref 12.0–15.0)
Immature Granulocytes: 1 %
Lymphocytes Relative: 27 %
Lymphs Abs: 3.3 10*3/uL (ref 0.7–4.0)
MCH: 26.7 pg (ref 26.0–34.0)
MCHC: 32.9 g/dL (ref 30.0–36.0)
MCV: 81.3 fL (ref 80.0–100.0)
Monocytes Absolute: 0.8 10*3/uL (ref 0.1–1.0)
Monocytes Relative: 7 %
Neutro Abs: 7.7 10*3/uL (ref 1.7–7.7)
Neutrophils Relative %: 63 %
Platelet Count: 402 10*3/uL — ABNORMAL HIGH (ref 150–400)
RBC: 4.38 MIL/uL (ref 3.87–5.11)
RDW: 14.3 % (ref 11.5–15.5)
WBC Count: 12.1 10*3/uL — ABNORMAL HIGH (ref 4.0–10.5)
nRBC: 0 % (ref 0.0–0.2)

## 2020-08-11 LAB — CMP (CANCER CENTER ONLY)
ALT: 58 U/L — ABNORMAL HIGH (ref 0–44)
AST: 32 U/L (ref 15–41)
Albumin: 3.2 g/dL — ABNORMAL LOW (ref 3.5–5.0)
Alkaline Phosphatase: 133 U/L — ABNORMAL HIGH (ref 38–126)
Anion gap: 8 (ref 5–15)
BUN: 26 mg/dL — ABNORMAL HIGH (ref 6–20)
CO2: 25 mmol/L (ref 22–32)
Calcium: 8.8 mg/dL — ABNORMAL LOW (ref 8.9–10.3)
Chloride: 107 mmol/L (ref 98–111)
Creatinine: 2.05 mg/dL — ABNORMAL HIGH (ref 0.44–1.00)
GFR, Estimated: 31 mL/min — ABNORMAL LOW (ref >60)
Glucose, Bld: 100 mg/dL — ABNORMAL HIGH (ref 70–99)
Potassium: 4 mmol/L (ref 3.5–5.1)
Sodium: 140 mmol/L (ref 135–145)
Total Bilirubin: 0.4 mg/dL (ref 0.3–1.2)
Total Protein: 6.8 g/dL (ref 6.5–8.1)

## 2020-08-11 NOTE — Progress Notes (Signed)
[facility] NOTE  Patient Care Team: Eulis Foster, MD as PCP - General (Family Medicine)  CHIEF COMPLAINTS/PURPOSE OF CONSULTATION:  Leukocytosis  ASSESSMENT & PLAN:   This is a pleasant 40 year old female patient with past medical history significant for hypertension, dyslipidemia referred to hematology for evaluation of persistent leukocytosis for the past several years.   1.  Leukocytosis with neutrophilia and thrombocytosis. MPN work up negative.  Patient is here for a follow-up. Since last visit, she has noticed some chest pain on the left side.  She has an appointment with cardiology in September. Rest of the pertinent review of systems unremarkable. Physical examination unremarkable except for obesity. We will review CBC today, if she has persistent and progressive leukocytosis, will try to arrange for a bone marrow aspiration and biopsy.  2.  B12 deficiency, she was started on B12 supplementation 1000 mcg daily.  Last labs in June showed normal B12 levels.  Okay to continue supplementation for now.  We will monitor this on the next set of labs due in November.  3.  Anemia, normocytic, normochromic likely related to chronic kidney disease we will follow-up on labs today.  4.  Slightly elevated alkaline phosphatase, continue to monitor this on future labs, CMP ordered and pending.  5. Chest pain on exertion, points to the left side and some times travels to the arm. I have recommended her to go to the emergency room if she continues to notice his chest pain.  Have also encouraged her to call cardiology and seek a sooner appointment, although she is young, given her CKD, obesity and hypertension, she is at risk for cardiovascular disease.  She expressed understanding of the recommendations  HISTORY OF PRESENTING ILLNESS:   Zoe Woodard 40 y.o. female is here because of leukocytosis.  This is a very pleasant 40 year old healthy female patient  with past medical history significant for hypertension, chronic kidney disease and dyslipidemia referred to hematology for evaluation of persistent leukocytosis.    Interval History  This is a very pleasant 40 year old female patient with past medical history significant for chronic kidney disease likely secondary to hypertension referred to hematology initially for evaluation of leukocytosis and thrombocytosis.  During her initial visit, we have proceeded with myeloproliferative work-up on her labs which was unremarkable.  No evidence of BCR ABL translocation.  Given young age, no red flags and stable leukocytosis, recommended her to follow-up with repeat labs.  She is here for follow-up.  Since last visit, she has noticed some chest pain especially when she does some exertional activity, has a cardiology appointment scheduled for September. Besides this, she denies any B symptoms.  She has been taking all her medications as prescribed.  Hypertension well-controlled, follow-up with nephrology as recommended. Rest of the pertinent 10 point ROS reviewed and negative.   MEDICAL HISTORY:  Past Medical History:  Diagnosis Date   Abdominal pain 12/28/2019   Back pain 10/10/2019   Chronic kidney disease    Ear pain, bilateral 04/15/2020   Hyperlipidemia    Hypertensive urgency 07/22/2019   Hypocalcemia 08/22/2019   Low blood pressure 02/07/2020   Obesity    Pyelonephritis 11/08/2019    SURGICAL HISTORY: Past Surgical History:  Procedure Laterality Date   ABDOMINAL HYSTERECTOMY     APPENDECTOMY     bladder laceration  01/04/2015   excision of thyroglobal duct cyst  02/01/2012   right ovarian cystoectomy  01/02/2015   right ovarian egstectomy  04/27/2013   SPINE SURGERY  TONSILLECTOMY     TUBAL LIGATION      SOCIAL HISTORY: Social History   Socioeconomic History   Marital status: Single    Spouse name: Not on file   Number of children: Not on file   Years of education: Not on file    Highest education level: Not on file  Occupational History   Not on file  Tobacco Use   Smoking status: Never   Smokeless tobacco: Never  Vaping Use   Vaping Use: Never used  Substance and Sexual Activity   Alcohol use: No   Drug use: No   Sexual activity: Not Currently    Partners: Male    Birth control/protection: Surgical  Other Topics Concern   Not on file  Social History Narrative   Lives with 3 children    Not working currently   Not sexually active.    Social Determinants of Health   Financial Resource Strain: Not on file  Food Insecurity: Not on file  Transportation Needs: Not on file  Physical Activity: Not on file  Stress: Not on file  Social Connections: Not on file  Intimate Partner Violence: Not on file    FAMILY HISTORY: Family History  Problem Relation Age of Onset   Diabetes Mother    Healthy Father     ALLERGIES:  has No Known Allergies.  MEDICATIONS:  Current Outpatient Medications  Medication Sig Dispense Refill   amLODipine (NORVASC) 10 MG tablet TAKE 1 TABLET BY MOUTH EVERYDAY AT BEDTIME 30 tablet 3   benzonatate (TESSALON) 100 MG capsule TAKE 1 CAPSULE BY MOUTH 2 TIMES DAILY AS NEEDED FOR COUGH 30 capsule 0   diclofenac Sodium (VOLTAREN) 1 % GEL Apply 4 g topically 4 (four) times daily. 150 g 1   famotidine (PEPCID) 20 MG tablet TAKE 1 TABLET BY MOUTH TWICE A DAY 60 tablet 3   Fenofibrate 40 MG TABS Take 1 tablet (40 mg total) by mouth daily. 90 tablet 1   loratadine (CLARITIN) 10 MG tablet Take 1 tablet (10 mg total) by mouth daily. 30 tablet 11   metoprolol tartrate (LOPRESSOR) 25 MG tablet Take 1 tablet (25 mg total) by mouth 2 (two) times daily. 60 tablet 1   montelukast (SINGULAIR) 10 MG tablet TAKE 1 TABLET BY MOUTH EVERYDAY AT BEDTIME 30 tablet 3   polyethylene glycol powder (GLYCOLAX/MIRALAX) 17 GM/SCOOP powder Take 17 g by mouth daily as needed. 510 g 1   rosuvastatin (CRESTOR) 10 MG tablet Take 1 tablet (10 mg total) by mouth  every other day. (Patient taking differently: Take 10 mg by mouth every other day. Takes on M-W-F) 90 tablet 1   vitamin B-12 (CYANOCOBALAMIN) 1000 MCG tablet Take 1,000 mcg by mouth daily.     No current facility-administered medications for this visit.     PHYSICAL EXAMINATION: ECOG PERFORMANCE STATUS: 0 - Asymptomatic  Vitals:   08/11/20 0948  BP: 118/82  Pulse: 79  Resp: 18  Temp: 97.8 F (36.6 C)  SpO2: 100%    Filed Weights   08/11/20 0941 08/11/20 0948  Weight: 196 lb (88.9 kg) 196 lb (88.9 kg)   Physical Exam Constitutional:      Appearance: Normal appearance.  HENT:     Head: Normocephalic and atraumatic.  Cardiovascular:     Rate and Rhythm: Normal rate and regular rhythm.  Pulmonary:     Effort: Pulmonary effort is normal.     Breath sounds: Normal breath sounds.  Abdominal:  General: Abdomen is flat. Bowel sounds are normal. There is no distension.  Musculoskeletal:        General: No swelling or tenderness.     Cervical back: Normal range of motion and neck supple.  Skin:    General: Skin is warm and dry.  Neurological:     General: No focal deficit present.     Mental Status: She is alert.  Psychiatric:        Mood and Affect: Mood normal.     LABORATORY DATA:  I have reviewed the data as listed Lab Results  Component Value Date   WBC 16.4 (H) 06/25/2020   HGB 11.7 (L) 06/25/2020   HCT 35.9 (L) 06/25/2020   MCV 81.2 06/25/2020   PLT 382 06/25/2020     Chemistry      Component Value Date/Time   NA 142 06/25/2020 1121   NA 139 02/07/2020 1116   K 3.8 06/25/2020 1121   CL 107 06/25/2020 1121   CO2 24 06/25/2020 1121   BUN 19 06/25/2020 1121   BUN 27 (H) 02/07/2020 1116   CREATININE 2.01 (H) 06/25/2020 1121      Component Value Date/Time   CALCIUM 8.8 (L) 06/25/2020 1121   ALKPHOS 156 (H) 06/25/2020 1121   AST 20 06/25/2020 1121   ALT 30 06/25/2020 1121   BILITOT 0.3 06/25/2020 1121     I have reviewed pertinent labs from  last visit.  We will repeat CBC, CMP and flow today.  RADIOGRAPHIC STUDIES: I have personally reviewed the radiological images as listed and agreed with the findings in the report. No results found.   All questions were answered. The patient knows to call the clinic with any problems, questions or concerns.     Benay Pike, MD 08/11/2020 9:57 AM

## 2020-08-14 ENCOUNTER — Other Ambulatory Visit: Payer: Self-pay | Admitting: Family Medicine

## 2020-08-20 ENCOUNTER — Other Ambulatory Visit: Payer: Self-pay | Admitting: Family Medicine

## 2020-08-20 DIAGNOSIS — R053 Chronic cough: Secondary | ICD-10-CM

## 2020-09-02 ENCOUNTER — Other Ambulatory Visit: Payer: Self-pay | Admitting: Family Medicine

## 2020-09-02 DIAGNOSIS — R053 Chronic cough: Secondary | ICD-10-CM

## 2020-09-02 DIAGNOSIS — I10 Essential (primary) hypertension: Secondary | ICD-10-CM

## 2020-09-06 ENCOUNTER — Other Ambulatory Visit: Payer: Self-pay | Admitting: Family Medicine

## 2020-09-06 DIAGNOSIS — R053 Chronic cough: Secondary | ICD-10-CM

## 2020-09-10 DIAGNOSIS — R252 Cramp and spasm: Secondary | ICD-10-CM | POA: Diagnosis not present

## 2020-09-10 DIAGNOSIS — N184 Chronic kidney disease, stage 4 (severe): Secondary | ICD-10-CM | POA: Diagnosis not present

## 2020-09-10 DIAGNOSIS — I129 Hypertensive chronic kidney disease with stage 1 through stage 4 chronic kidney disease, or unspecified chronic kidney disease: Secondary | ICD-10-CM | POA: Diagnosis not present

## 2020-09-10 DIAGNOSIS — R053 Chronic cough: Secondary | ICD-10-CM | POA: Diagnosis not present

## 2020-09-10 NOTE — Progress Notes (Signed)
    SUBJECTIVE:   CHIEF COMPLAINT / HPI: Hyperlipidemia   Hyperlipidemia Rosuvastatin 10 mg &  fenofibrate 40 mg daily. Fenofibrate was not covered by insurance so she did not start this medication. She is unable to tolerate the rosuvastatin.   Chest pain Has apopintment with cardiologist this week. She reports that she is having less chest pain than she was in July. She reports only 2 episodes this month. She denies current chest pain.   HTN  She has elevated BP in office  Vitals:   09/11/20 0932 09/11/20 1008  BP: (!) 132/100 120/86   She reports that her nephrologist discontinued the amlodipine and started her on losartan that patient states she just started today.    Chronic Cough  She reports that she continues to have intermittent couhging and medicaid is not covering the tessalon perles. She request another medication.   Healthcare maintenance Patient recommended for: 1.  COVID-vaccine, declines  2.  Tetanus vaccine, patient wishes to have this completed today  3.  Pneumonia vaccine, patient would like PCV 20 today 4.  Recommend influenza vaccine in September, patient plans to schedule in the next few weeks when clinic stock is available   PERTINENT  PMH / PSH:  Hyperlipidemia  Chronic cough Atypical chest pain CKD Hypertension  OBJECTIVE:   BP 120/86   Pulse 81   Wt 192 lb (87.1 kg)   SpO2 100%   BMI 34.01 kg/m   General: Female appearing stated age in no acute distress Cardio: Normal S1 and S2, no S3 or S4. Rhythm is regular. No murmurs or rubs.  Bilateral radial pulses palpable. No reproducible chest pain upon palpation  Pulm: Clear to auscultation bilaterally, no crackles, wheezing, or diminished breath sounds. Normal respiratory effort, intermittent coughing with deep breathing Abdomen: Bowel sounds normal. Abdomen soft and non-tender.  Extremities: No peripheral edema.    ASSESSMENT/PLAN:   Essential hypertension Patient continues to have elevated  blood pressures.  She denies any headache or chest pain at this time. -Remove amlodipine from list as patient states nephrologist discontinued this -Patient to continue losartan, just started this medication today -Continue Lopressor 25 mg twice daily -Follow-up in 1 month  Hyperlipidemia Referral to lipid clinic -Patient to follow-up with cardiologist as scheduled for chest pain as listed above  Atypical chest pain Patient will follow-up with cardiology as scheduled on September 19, 2020  Healthcare maintenance Patient will schedule nursing visit for tetanus,influenza (when available in our clinic) and PNA vaccines.      Eulis Foster, MD [place]

## 2020-09-10 NOTE — Patient Instructions (Addendum)
It was a pleasure to see you today!  Thank you for choosing [facility] for your primary care.   Zoe Woodard was seen for elevated cholesterol and chest pain follow-up.   Our plans for today were: Chest pain: Please follow-up with cardiology as scheduled.  I am glad to hear that your chest pain is improving/decreasing. Hyperlipidemia: I have placed a referral to the lipid clinic in order to help with your cholesterol and triglyceride levels. Today you will receive your pneumonia and tetanus vaccines has requested.  To keep you healthy, please keep in mind the following health maintenance items that you are due for:   Tetanus vaccine IM Pneumonia vaccine   You should return to our clinic in 1 months for blood pressure follow-up.   Best Wishes,   Dr. Alba Cory

## 2020-09-11 ENCOUNTER — Other Ambulatory Visit: Payer: Self-pay | Admitting: Family Medicine

## 2020-09-11 ENCOUNTER — Encounter: Payer: Self-pay | Admitting: Family Medicine

## 2020-09-11 ENCOUNTER — Other Ambulatory Visit: Payer: Self-pay

## 2020-09-11 ENCOUNTER — Ambulatory Visit (INDEPENDENT_AMBULATORY_CARE_PROVIDER_SITE_OTHER): Payer: Medicaid Other | Admitting: Family Medicine

## 2020-09-11 VITALS — BP 120/86 | HR 81 | Wt 192.0 lb

## 2020-09-11 DIAGNOSIS — I10 Essential (primary) hypertension: Secondary | ICD-10-CM

## 2020-09-11 DIAGNOSIS — E782 Mixed hyperlipidemia: Secondary | ICD-10-CM | POA: Diagnosis not present

## 2020-09-11 DIAGNOSIS — Z0000 Encounter for general adult medical examination without abnormal findings: Secondary | ICD-10-CM

## 2020-09-11 DIAGNOSIS — R0789 Other chest pain: Secondary | ICD-10-CM

## 2020-09-11 MED ORDER — DEXTROMETHORPHAN HBR 15 MG/5ML PO SYRP: 10.0000 mL | ORAL_SOLUTION | Freq: Four times a day (QID) | ORAL | 0 refills | Status: DC | PRN

## 2020-09-11 NOTE — Assessment & Plan Note (Signed)
Referral to lipid clinic -Patient to follow-up with cardiologist as scheduled for chest pain as listed above

## 2020-09-11 NOTE — Assessment & Plan Note (Signed)
Patient continues to have elevated blood pressures.  She denies any headache or chest pain at this time. -Remove amlodipine from list as patient states nephrologist discontinued this -Patient to continue losartan, just started this medication today -Continue Lopressor 25 mg twice daily -Follow-up in 1 month

## 2020-09-11 NOTE — Assessment & Plan Note (Signed)
Patient will follow-up with cardiology as scheduled on September 19, 2020

## 2020-09-13 NOTE — Assessment & Plan Note (Addendum)
Patient will schedule nursing visit for tetanus,influenza (when available in our clinic) and PNA vaccines.

## 2020-09-16 ENCOUNTER — Other Ambulatory Visit: Payer: Self-pay

## 2020-09-16 ENCOUNTER — Ambulatory Visit
Admission: RE | Admit: 2020-09-16 | Discharge: 2020-09-16 | Disposition: A | Discharge status: Home / Self Care | Payer: Medicaid Other | Source: Ambulatory Visit | Attending: Family Medicine | Admitting: Family Medicine

## 2020-09-16 DIAGNOSIS — N6310 Unspecified lump in the right breast, unspecified quadrant: Secondary | ICD-10-CM

## 2020-09-16 DIAGNOSIS — R922 Inconclusive mammogram: Secondary | ICD-10-CM | POA: Diagnosis not present

## 2020-09-19 ENCOUNTER — Ambulatory Visit: Payer: Medicaid Other | Admitting: Cardiovascular Disease

## 2020-09-19 ENCOUNTER — Other Ambulatory Visit: Payer: Self-pay

## 2020-09-19 ENCOUNTER — Encounter: Payer: Self-pay | Admitting: Cardiovascular Disease

## 2020-09-19 VITALS — BP 128/90 | HR 90 | Ht 63.0 in | Wt 192.8 lb

## 2020-09-19 DIAGNOSIS — R0789 Other chest pain: Secondary | ICD-10-CM

## 2020-09-19 NOTE — Progress Notes (Signed)
Cardiology Office Note:    Date:  09/19/2020   ID:  Zoe Woodard, DOB [birth year], MRN [id number]  PCP:  Eulis Foster, MD   [facility] Providers Cardiologist: Gifford Ballon   Referring MD: Martyn Malay, MD   Chief Complaint  Patient presents with   Chest Pain     Sept. 9, 2022:   Zoe Woodard is a 40 y.o. female with a hx of chest pain , hx of HTN , CKD,  elevated triglyceride levels   We have were asked to see her today by Dr. Owens Shark for further evaluation and management of some episodes of chest pain.  Has had a few times.  Wakes up with some cp  Pain last for only a few seconds  Does only a little exercise  No CP with exercise Does have some DOE with exercise  Has been on a statin but these caused leg aches  Trig level is very elevated.  Does not limit her carbs    She found her HTN and CKD last year    Past Medical History:  Diagnosis Date   Abdominal pain 12/28/2019   Back pain 10/10/2019   Chronic kidney disease    Ear pain, bilateral 04/15/2020   Hyperlipidemia    Hypertensive urgency 07/22/2019   Hypocalcemia 08/22/2019   Low blood pressure 02/07/2020   Obesity    Pyelonephritis 11/08/2019    Past Surgical History:  Procedure Laterality Date   ABDOMINAL HYSTERECTOMY     APPENDECTOMY     bladder laceration  01/04/2015   excision of thyroglobal duct cyst  02/01/2012   right ovarian cystoectomy  01/02/2015   right ovarian egstectomy  04/27/2013   SPINE SURGERY     TONSILLECTOMY     TUBAL LIGATION      Current Medications: Current Meds  Medication Sig   BENZONATATE PO Take 100 mg by mouth daily as needed.   dextromethorphan (DELSYM) 30 MG/5ML liquid Take 5 mLs (30 mg total) by mouth at bedtime as needed for cough.   diclofenac Sodium (VOLTAREN) 1 % GEL Apply 4 g topically 4 (four) times daily.   famotidine (PEPCID) 20 MG tablet TAKE 1 TABLET BY MOUTH TWICE A DAY   loratadine (CLARITIN) 10 MG tablet TAKE 1 TABLET BY MOUTH EVERY DAY    losartan (COZAAR) 50 MG tablet Take 50 mg by mouth daily.   metoprolol tartrate (LOPRESSOR) 25 MG tablet TAKE 1 TABLET BY MOUTH TWICE A DAY   montelukast (SINGULAIR) 10 MG tablet TAKE 1 TABLET BY MOUTH EVERYDAY AT BEDTIME   polyethylene glycol powder (GLYCOLAX/MIRALAX) 17 GM/SCOOP powder Take 17 g by mouth daily as needed.   vitamin B-12 (CYANOCOBALAMIN) 1000 MCG tablet Take 1,000 mcg by mouth daily.     Allergies:   Patient has no known allergies.   Social History   Socioeconomic History   Marital status: Single    Spouse name: Not on file   Number of children: Not on file   Years of education: Not on file   Highest education level: Not on file  Occupational History   Not on file  Tobacco Use   Smoking status: Never   Smokeless tobacco: Never  Vaping Use   Vaping Use: Never used  Substance and Sexual Activity   Alcohol use: No   Drug use: No   Sexual activity: Not Currently    Partners: Male    Birth control/protection: Surgical  Other Topics Concern   Not on file  Social History Narrative  Lives with 3 children    Not working currently   Not sexually active.    Social Determinants of Health   Financial Resource Strain: Not on file  Food Insecurity: Not on file  Transportation Needs: Not on file  Physical Activity: Not on file  Stress: Not on file  Social Connections: Not on file     Family History: The patient's family history includes Diabetes in her mother; Healthy in her father.  ROS:   Please see the history of present illness.     All other systems reviewed and are negative.  EKGs/Labs/Other Studies Reviewed:    The following studies were reviewed today:   EKG: September 19, 2020: Normal sinus rhythm at 90.  No ST or T wave changes.  Recent Labs: 08/11/2020: ALT 58; BUN 26; Creatinine 2.05; Hemoglobin 11.7; Platelet Count 402; Potassium 4.0; Sodium 140  Recent Lipid Panel    Component Value Date/Time   CHOL 177 08/06/2020 1027   TRIG 523 (H)  08/06/2020 1027   HDL 36 (L) 08/06/2020 1027   CHOLHDL 4.9 (H) 08/06/2020 1027   LDLCALC 62 08/06/2020 1027     Risk Assessment/Calculations:           Physical Exam:    VS:  BP 128/90   Pulse 90   Ht '5\' 3"'$  (1.6 m)   Wt 192 lb 12.8 oz (87.5 kg)   SpO2 98%   BMI 34.15 kg/m     Wt Readings from Last 3 Encounters:  09/19/20 192 lb 12.8 oz (87.5 kg)  09/11/20 192 lb (87.1 kg)  08/11/20 196 lb (88.9 kg)     GEN:  Well nourished, well developed in no acute distress HEENT: Normal NECK: No JVD; No carotid bruits LYMPHATICS: No lymphadenopathy CARDIAC: RRR, no murmurs, rubs, gallops RESPIRATORY:  Clear to auscultation without rales, wheezing or rhonchi  ABDOMEN: Soft, non-tender, non-distended MUSCULOSKELETAL:  No edema; No deformity  SKIN: Warm and dry NEUROLOGIC:  Alert and oriented x 3 PSYCHIATRIC:  Normal affect   ASSESSMENT:    1. Atypical chest pain    PLAN:    In order of problems listed above:  Atypical chest pain: Zoe Woodard presents with atypical musculoskeletal chest pains.  These pains only last for a second or so and are not consistent with angina.  They typically wake her up at night.  They do not occur with exertion or exercise.  I have advised her to work on a better diet.  We given her some guidelines on the Mediterranean diet.  We will encourage her to start exercising.  If her chest pains continue we can proceed with further testing.  2.  Elevated triglyceride levels: I encouraged her to stay away from foods that are white, wheat, sweet.  She needs to exercise regularly.  She will follow-up with her primary medical doctor.        Medication Adjustments/Labs and Tests Ordered: Current medicines are reviewed at length with the patient today.  Concerns regarding medicines are outlined above.  Orders Placed This Encounter  Procedures   EKG 12-Lead    No orders of the defined types were placed in this encounter.   Patient Instructions  Medication  Instructions:  No changes today *If you need a refill on your cardiac medications before your next appointment, please call your pharmacy*   Lab Work: none If you have labs (blood work) drawn today and your tests are completely normal, you will receive your results only by: [place] (if  you have MyChart) OR A paper copy in the mail If you have any lab test that is abnormal or we need to change your treatment, we will call you to review the results.   Testing/Procedures: none   Follow-Up: As needed   Mediterranean Diet A Mediterranean diet refers to food and lifestyle choices that are based on the traditions of countries located on the [employer]. This way of eating has been shown to help prevent certain conditions and improve outcomes for people who have chronic diseases, like kidney disease and heart disease. What are tips for following this plan? Lifestyle Cook and eat meals together with your family, when possible. Drink enough fluid to keep your urine clear or pale yellow. Be physically active every day. This includes: Aerobic exercise like running or swimming. Leisure activities like gardening, walking, or housework. Get 7-8 hours of sleep each night. If recommended by your health care provider, drink red wine in moderation. This means 1 glass a day for nonpregnant women and 2 glasses a day for men. A glass of wine equals 5 oz (150 mL). Reading food labels  Check the serving size of packaged foods. For foods such as rice and pasta, the serving size refers to the amount of cooked product, not dry. Check the total fat in packaged foods. Avoid foods that have saturated fat or trans fats. Check the ingredients list for added sugars, such as corn syrup. Shopping At the grocery store, buy most of your food from the areas near the walls of the store. This includes: Fresh fruits and vegetables (produce). Grains, beans, nuts, and seeds. Some of these may be available  in unpackaged forms or large amounts (in bulk). Fresh seafood. Poultry and eggs. Low-fat dairy products. Buy whole ingredients instead of prepackaged foods. Buy fresh fruits and vegetables in-season from local farmers markets. Buy frozen fruits and vegetables in resealable bags. If you do not have access to quality fresh seafood, buy precooked frozen shrimp or canned fish, such as tuna, salmon, or sardines. Buy small amounts of raw or cooked vegetables, salads, or olives from the deli or salad bar at your store. Stock your pantry so you always have certain foods on hand, such as olive oil, canned tuna, canned tomatoes, rice, pasta, and beans. Cooking Cook foods with extra-virgin olive oil instead of using butter or other vegetable oils. Have meat as a side dish, and have vegetables or grains as your main dish. This means having meat in small portions or adding small amounts of meat to foods like pasta or stew. Use beans or vegetables instead of meat in common dishes like chili or lasagna. Experiment with different cooking methods. Try roasting or broiling vegetables instead of steaming or sauteing them. Add frozen vegetables to soups, stews, pasta, or rice. Add nuts or seeds for added healthy fat at each meal. You can add these to yogurt, salads, or vegetable dishes. Marinate fish or vegetables using olive oil, lemon juice, garlic, and fresh herbs. Meal planning  Plan to eat 1 vegetarian meal one day each week. Try to work up to 2 vegetarian meals, if possible. Eat seafood 2 or more times a week. Have healthy snacks readily available, such as: Vegetable sticks with hummus. Greek yogurt. Fruit and nut trail mix. Eat balanced meals throughout the week. This includes: Fruit: 2-3 servings a day Vegetables: 4-5 servings a day Low-fat dairy: 2 servings a day Fish, poultry, or lean meat: 1 serving a day Beans and legumes: 2 or  more servings a week Nuts and seeds: 1-2 servings a day Whole  grains: 6-8 servings a day Extra-virgin olive oil: 3-4 servings a day Limit red meat and sweets to only a few servings a month What are my food choices? Mediterranean diet Recommended Grains: Whole-grain pasta. Brown rice. Bulgar wheat. Polenta. Couscous. Whole-wheat bread. [place]. Vegetables: Artichokes. Beets. Broccoli. Cabbage. Carrots. Eggplant. Green beans. Chard. Kale. Spinach. Onions. Leeks. Peas. Squash. Tomatoes. Peppers. Radishes. Fruits: Apples. Apricots. Avocado. Berries. Bananas. Cherries. Dates. Figs. Grapes. Lemons. Melon. Oranges. Peaches. Plums. Pomegranate. Meats and other protein foods: Beans. Almonds. Sunflower seeds. Pine nuts. Peanuts. [place]. Salmon. Scallops. Shrimp. [place]. Tilapia. Clams. Oysters. Eggs. Dairy: Low-fat milk. Cheese. Greek yogurt. Beverages: Water. Red wine. Herbal tea. Fats and oils: Extra virgin olive oil. Avocado oil. Grape seed oil. Sweets and desserts: [place] yogurt with honey. Baked apples. Poached pears. Trail mix. Seasoning and other foods: Basil. Cilantro. Coriander. Cumin. Mint. Parsley. Sage. Rosemary. Tarragon. Garlic. Oregano. Thyme. Pepper. Balsalmic vinegar. Tahini. Hummus. Tomato sauce. Olives. Mushrooms. Limit these Grains: Prepackaged pasta or rice dishes. Prepackaged cereal with added sugar. Vegetables: Deep fried potatoes (french fries). Fruits: Fruit canned in syrup. Meats and other protein foods: Beef. Pork. Lamb. Poultry with skin. Hot dogs. Berniece Salines. Dairy: Ice cream. Sour cream. Whole milk. Beverages: Juice. Sugar-sweetened soft drinks. Beer. Liquor and spirits. Fats and oils: Butter. Canola oil. Vegetable oil. Beef fat (tallow). Lard. Sweets and desserts: Cookies. Cakes. Pies. Candy. Seasoning and other foods: Mayonnaise. Premade sauces and marinades. The items listed may not be a complete list. Talk with your dietitian about what dietary choices are right for you. Summary The Mediterranean diet includes both food and  lifestyle choices. Eat a variety of fresh fruits and vegetables, beans, nuts, seeds, and whole grains. Limit the amount of red meat and sweets that you eat. Talk with your health care provider about whether it is safe for you to drink red wine in moderation. This means 1 glass a day for nonpregnant women and 2 glasses a day for men. A glass of wine equals 5 oz (150 mL). This information is not intended to replace advice given to you by your health care provider. Make sure you discuss any questions you have with your health care provider. Document Revised: 08/28/2015 Document Reviewed: 08/21/2015 Elsevier Patient Education  2020 Rains, Mertie Moores, MD  09/19/2020 1:30 PM    [place]

## 2020-09-19 NOTE — Patient Instructions (Signed)
Medication Instructions:  No changes today *If you need a refill on your cardiac medications before your next appointment, please call your pharmacy*   Lab Work: none If you have labs (blood work) drawn today and your tests are completely normal, you will receive your results only by: [place] (if you have MyChart) OR A paper copy in the mail If you have any lab test that is abnormal or we need to change your treatment, we will call you to review the results.   Testing/Procedures: none   Follow-Up: As needed   Mediterranean Diet A Mediterranean diet refers to food and lifestyle choices that are based on the traditions of countries located on the [employer]. This way of eating has been shown to help prevent certain conditions and improve outcomes for people who have chronic diseases, like kidney disease and heart disease. What are tips for following this plan? Lifestyle Cook and eat meals together with your family, when possible. Drink enough fluid to keep your urine clear or pale yellow. Be physically active every day. This includes: Aerobic exercise like running or swimming. Leisure activities like gardening, walking, or housework. Get 7-8 hours of sleep each night. If recommended by your health care provider, drink red wine in moderation. This means 1 glass a day for nonpregnant women and 2 glasses a day for men. A glass of wine equals 5 oz (150 mL). Reading food labels  Check the serving size of packaged foods. For foods such as rice and pasta, the serving size refers to the amount of cooked product, not dry. Check the total fat in packaged foods. Avoid foods that have saturated fat or trans fats. Check the ingredients list for added sugars, such as corn syrup. Shopping At the grocery store, buy most of your food from the areas near the walls of the store. This includes: Fresh fruits and vegetables (produce). Grains, beans, nuts, and seeds. Some of these may  be available in unpackaged forms or large amounts (in bulk). Fresh seafood. Poultry and eggs. Low-fat dairy products. Buy whole ingredients instead of prepackaged foods. Buy fresh fruits and vegetables in-season from local farmers markets. Buy frozen fruits and vegetables in resealable bags. If you do not have access to quality fresh seafood, buy precooked frozen shrimp or canned fish, such as tuna, salmon, or sardines. Buy small amounts of raw or cooked vegetables, salads, or olives from the deli or salad bar at your store. Stock your pantry so you always have certain foods on hand, such as olive oil, canned tuna, canned tomatoes, rice, pasta, and beans. Cooking Cook foods with extra-virgin olive oil instead of using butter or other vegetable oils. Have meat as a side dish, and have vegetables or grains as your main dish. This means having meat in small portions or adding small amounts of meat to foods like pasta or stew. Use beans or vegetables instead of meat in common dishes like chili or lasagna. Experiment with different cooking methods. Try roasting or broiling vegetables instead of steaming or sauteing them. Add frozen vegetables to soups, stews, pasta, or rice. Add nuts or seeds for added healthy fat at each meal. You can add these to yogurt, salads, or vegetable dishes. Marinate fish or vegetables using olive oil, lemon juice, garlic, and fresh herbs. Meal planning  Plan to eat 1 vegetarian meal one day each week. Try to work up to 2 vegetarian meals, if possible. Eat seafood 2 or more times a week. Have healthy snacks readily  available, such as: Vegetable sticks with hummus. Greek yogurt. Fruit and nut trail mix. Eat balanced meals throughout the week. This includes: Fruit: 2-3 servings a day Vegetables: 4-5 servings a day Low-fat dairy: 2 servings a day Fish, poultry, or lean meat: 1 serving a day Beans and legumes: 2 or more servings a week Nuts and seeds: 1-2 servings a  day Whole grains: 6-8 servings a day Extra-virgin olive oil: 3-4 servings a day Limit red meat and sweets to only a few servings a month What are my food choices? Mediterranean diet Recommended Grains: Whole-grain pasta. Brown rice. Bulgar wheat. Polenta. Couscous. Whole-wheat bread. [place]. Vegetables: Artichokes. Beets. Broccoli. Cabbage. Carrots. Eggplant. Green beans. Chard. Kale. Spinach. Onions. Leeks. Peas. Squash. Tomatoes. Peppers. Radishes. Fruits: Apples. Apricots. Avocado. Berries. Bananas. Cherries. Dates. Figs. Grapes. Lemons. Melon. Oranges. Peaches. Plums. Pomegranate. Meats and other protein foods: Beans. Almonds. Sunflower seeds. Pine nuts. Peanuts. [place]. Salmon. Scallops. Shrimp. [place]. Tilapia. Clams. Oysters. Eggs. Dairy: Low-fat milk. Cheese. Greek yogurt. Beverages: Water. Red wine. Herbal tea. Fats and oils: Extra virgin olive oil. Avocado oil. Grape seed oil. Sweets and desserts: [place] yogurt with honey. Baked apples. Poached pears. Trail mix. Seasoning and other foods: Basil. Cilantro. Coriander. Cumin. Mint. Parsley. Sage. Rosemary. Tarragon. Garlic. Oregano. Thyme. Pepper. Balsalmic vinegar. Tahini. Hummus. Tomato sauce. Olives. Mushrooms. Limit these Grains: Prepackaged pasta or rice dishes. Prepackaged cereal with added sugar. Vegetables: Deep fried potatoes (french fries). Fruits: Fruit canned in syrup. Meats and other protein foods: Beef. Pork. Lamb. Poultry with skin. Hot dogs. [place] Salines. Dairy: Ice cream. Sour cream. Whole milk. Beverages: Juice. Sugar-sweetened soft drinks. Beer. Liquor and spirits. Fats and oils: Butter. Canola oil. Vegetable oil. Beef fat (tallow). Lard. Sweets and desserts: Cookies. Cakes. Pies. Candy. Seasoning and other foods: Mayonnaise. Premade sauces and marinades. The items listed may not be a complete list. Talk with your dietitian about what dietary choices are right for you. Summary The Mediterranean diet includes both  food and lifestyle choices. Eat a variety of fresh fruits and vegetables, beans, nuts, seeds, and whole grains. Limit the amount of red meat and sweets that you eat. Talk with your health care provider about whether it is safe for you to drink red wine in moderation. This means 1 glass a day for nonpregnant women and 2 glasses a day for men. A glass of wine equals 5 oz (150 mL). This information is not intended to replace advice given to you by your health care provider. Make sure you discuss any questions you have with your health care provider. Document Revised: 08/28/2015 Document Reviewed: 08/21/2015 Elsevier Patient Education  Zoe Woodard.

## 2020-10-01 ENCOUNTER — Other Ambulatory Visit: Payer: Self-pay | Admitting: Family Medicine

## 2020-10-01 DIAGNOSIS — R053 Chronic cough: Secondary | ICD-10-CM

## 2020-10-03 ENCOUNTER — Other Ambulatory Visit: Payer: Self-pay | Admitting: Family Medicine

## 2020-10-03 MED ORDER — BENZONATATE 100 MG PO CAPS: 100.0000 mg | ORAL_CAPSULE | Freq: Two times a day (BID) | ORAL | 2 refills | Status: DC | PRN

## 2020-10-03 NOTE — Telephone Encounter (Signed)
Prescription for Ladona Ridgel has been resent to pharmacy.

## 2020-10-03 NOTE — Telephone Encounter (Signed)
Patient lVM on nurse line asking why the tessalon pearls were denied. I attempted to call patient back, however I had to LVM.   Patient reported she has been taking this medication for almost one month and it is the only medication that helps her cough.

## 2020-10-20 DIAGNOSIS — N184 Chronic kidney disease, stage 4 (severe): Secondary | ICD-10-CM | POA: Diagnosis not present

## 2020-10-23 IMAGING — US US BREAST*R* LIMITED INC AXILLA
1 series · 10 of 10 positions shown · non-contrast
Comparison: No prior mammograms available for comparison.

CLINICAL DATA: 39-year-old female presenting for evaluation of a
possible mass in the right breast identified on a chest CT from
08/06/2019.

EXAM:
DIGITAL DIAGNOSTIC BILATERAL MAMMOGRAM WITH TOMO AND CAD; ULTRASOUND
RIGHT BREAST LIMITED

[Series 1: us breast*right* limited inc axilla · 0.05mm/px · 10 of 10 slices shown]
[im 1/10]
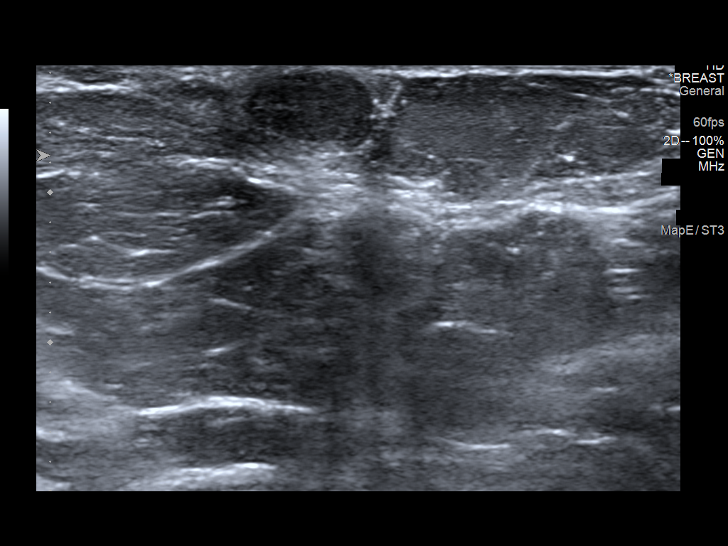
[im 2/10]
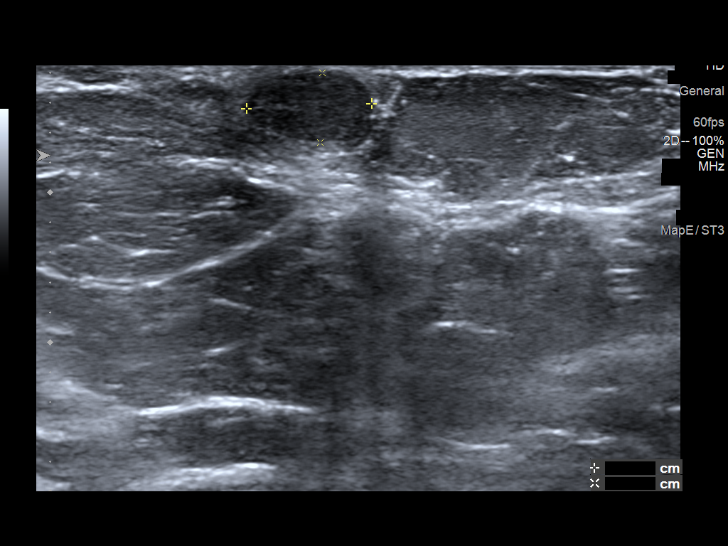
[im 3/10]
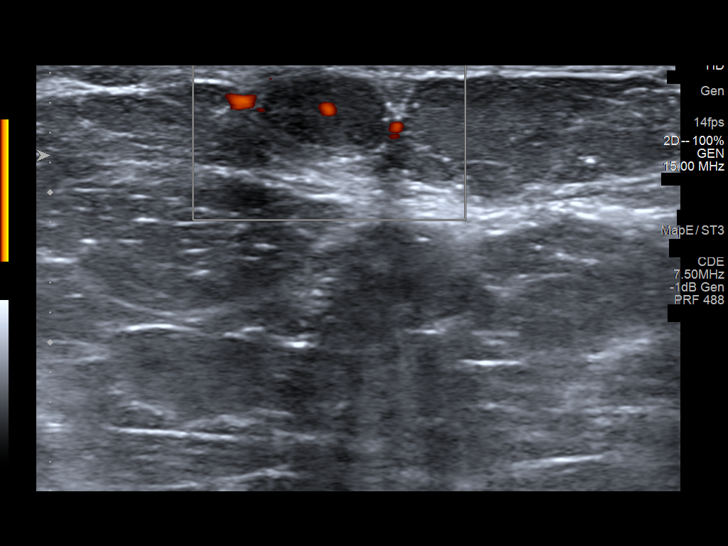
[im 4/10]
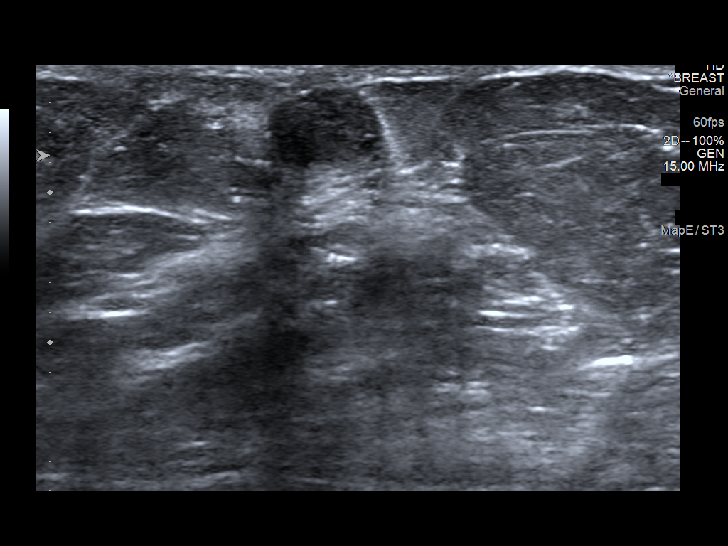
[im 5/10]
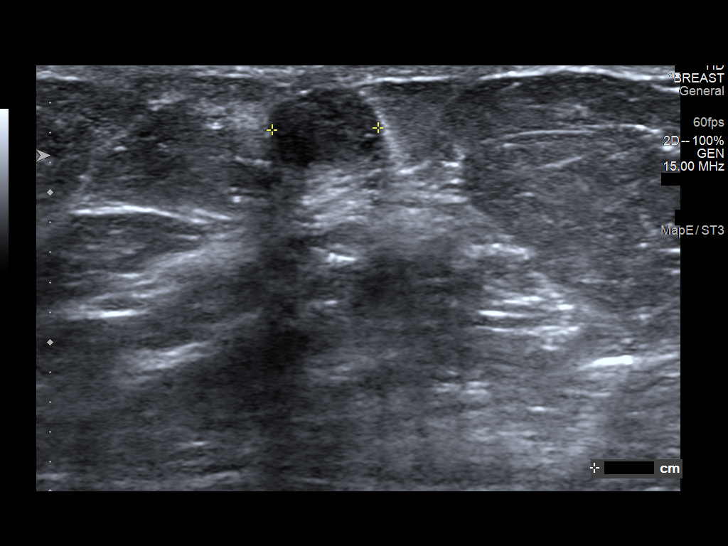
[im 6/10]
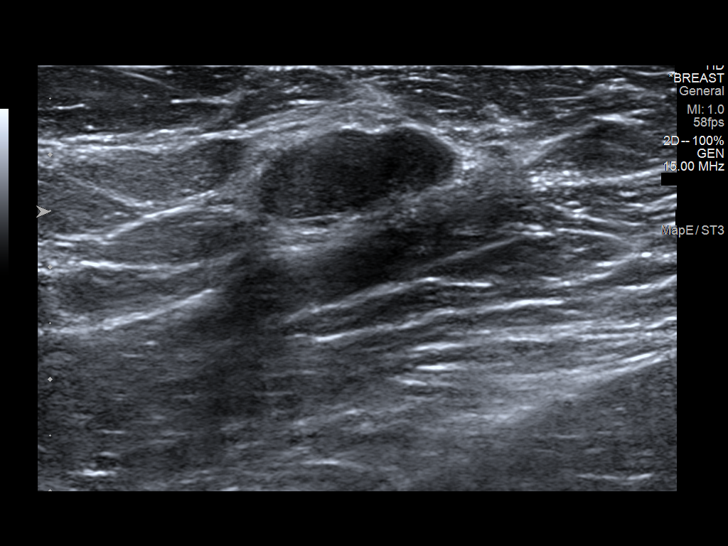
[im 7/10]
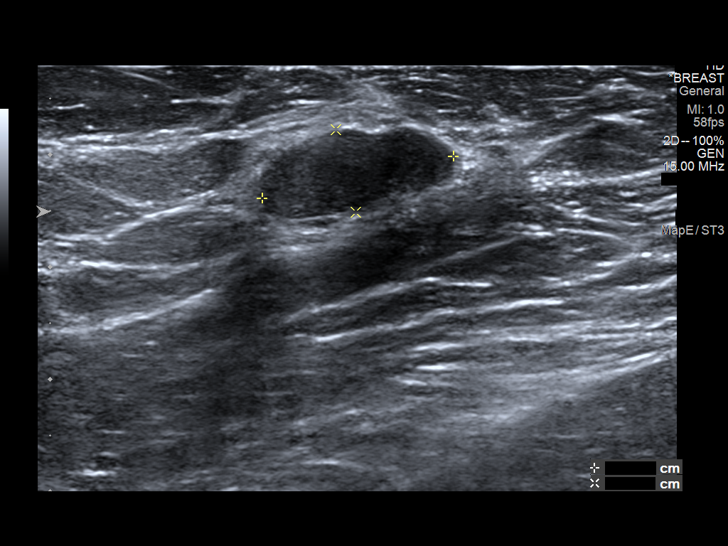
[im 8/10]
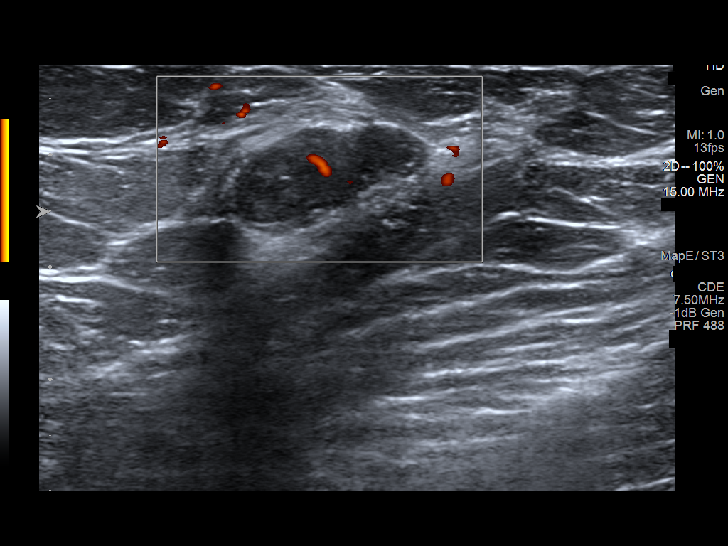
[im 9/10]
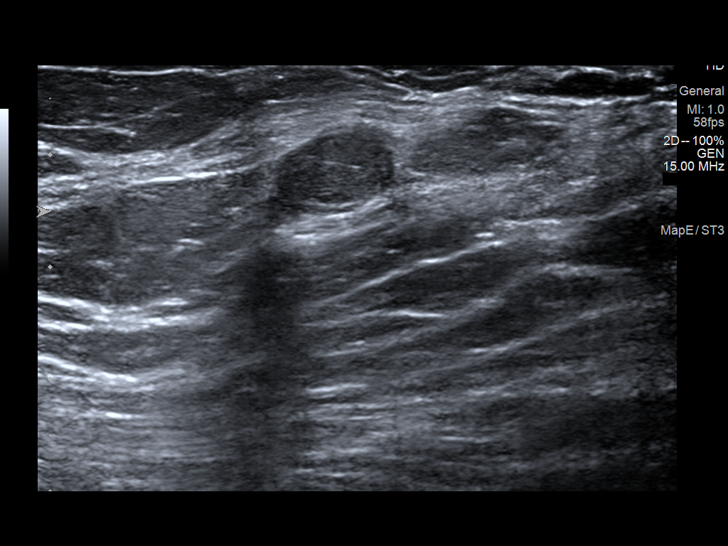
[im 10/10]
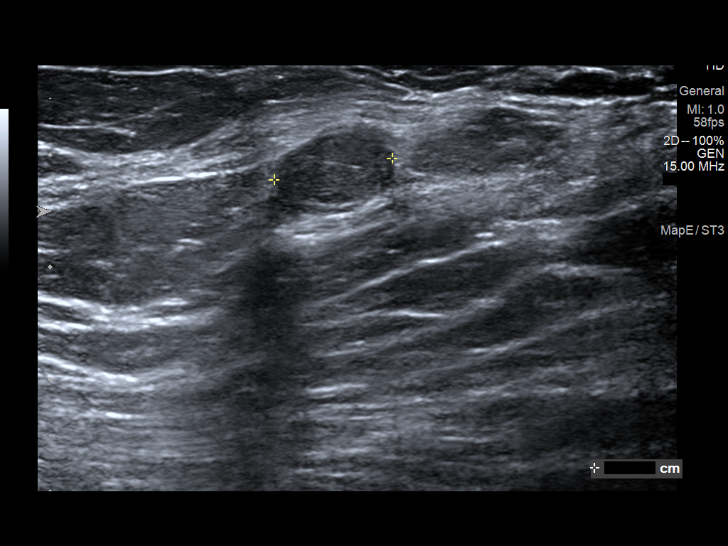

[10 of 10 positions shown; findings below may reference images not displayed]

Correlation made with chest CT images from 08/06/2019.

ACR Breast Density Category c: The breast tissue is heterogeneously
dense, which may obscure small masses.
FINDINGS: Tomosynthesis images of the right breast demonstrates an oval
obscured mass in the lateral retroareolar right breast measuring
approximately 1.3 cm. Another oval mass which is circumscribed is
seen in the lower-inner quadrant of the right breast which measures
9 mm.

Mammographic images were processed with CAD.

Ultrasound targeted to the retroareolar right breast at 3 o'clock
demonstrates a circumscribed oval hypoechoic mass measuring 0.8 x
0.5 x 0.7 cm.

Ultrasound of the right breast at 9 o'clock, 2 cm from the nipple
demonstrates a similar-appearing oval circumscribed hypoechoic mass
measuring 1.7 x 0.8 x 0.7 cm.
IMPRESSION: 1. There are 2 likely benign masses in the right breast, one at 3
o'clock and another at 9 o'clock. The 9 o'clock mass corresponds
with the mass identified on the patient's CT exam. These both likely
represent benign fibroadenomas.

2.  No evidence of malignancy in the bilateral breasts.

RECOMMENDATION:
Six-month follow-up right breast ultrasound to monitor the 2 likely
benign right breast masses.

I have discussed the findings and recommendations with the patient.
If applicable, a reminder letter will be sent to the patient
regarding the next appointment.

BI-RADS CATEGORY  2: Benign.

## 2020-10-23 IMAGING — MG DIGITAL DIAGNOSTIC BILAT W/ TOMO W/ CAD
8 of 14 series · 8 of 40 positions shown · non-contrast
Comparison: No prior mammograms available for comparison.

CLINICAL DATA: 39-year-old female presenting for evaluation of a
possible mass in the right breast identified on a chest CT from
08/06/2019.

EXAM:
DIGITAL DIAGNOSTIC BILATERAL MAMMOGRAM WITH TOMO AND CAD; ULTRASOUND
RIGHT BREAST LIMITED

[R MLO synth-2D (1 of 2)]
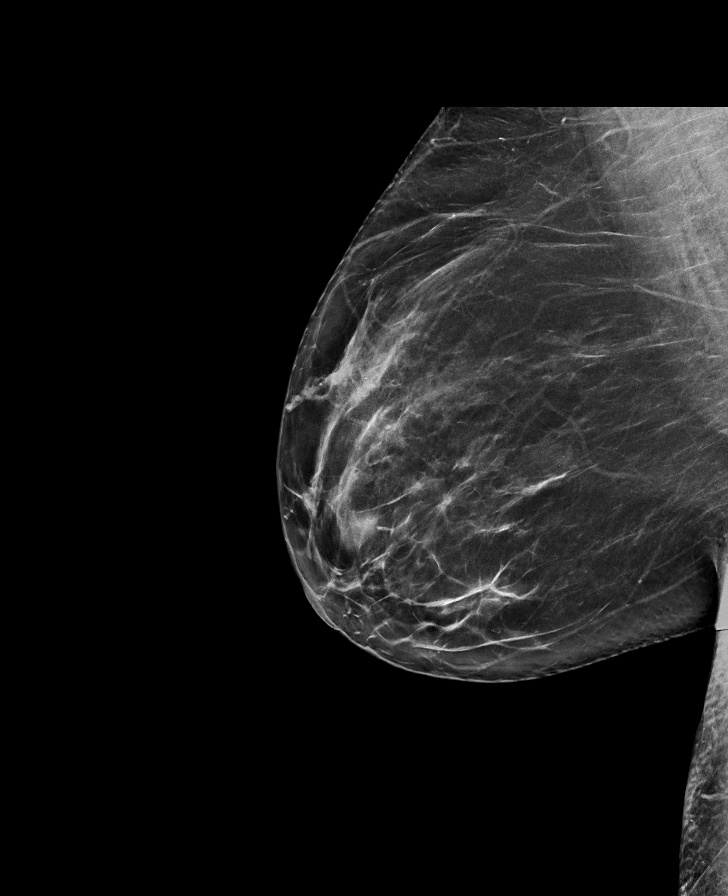

[L CC synth-2D]
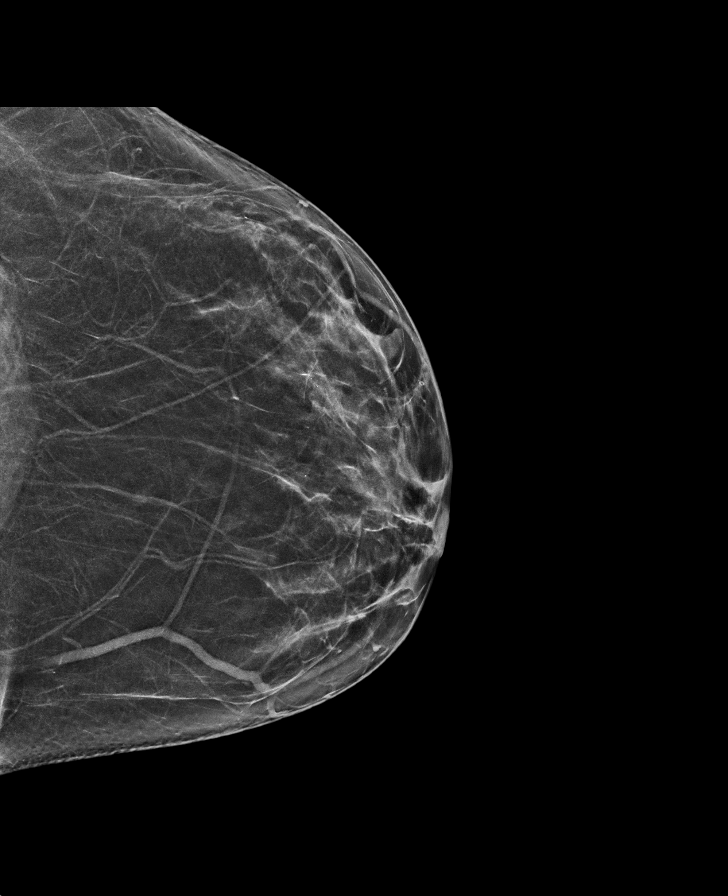

[R CC synth-2D (1 of 2)]
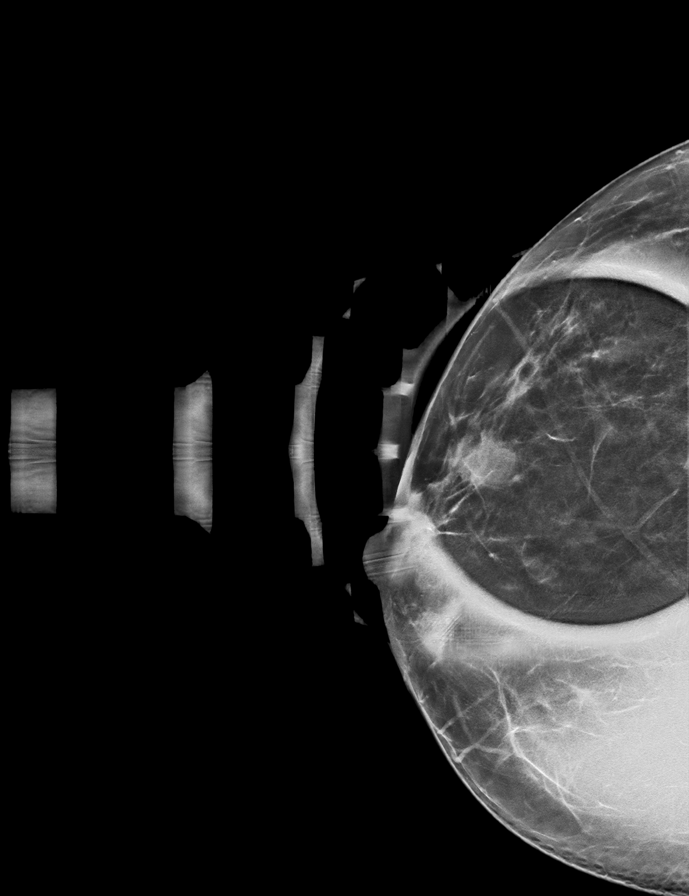

[R CC synth-2D (2 of 2)]
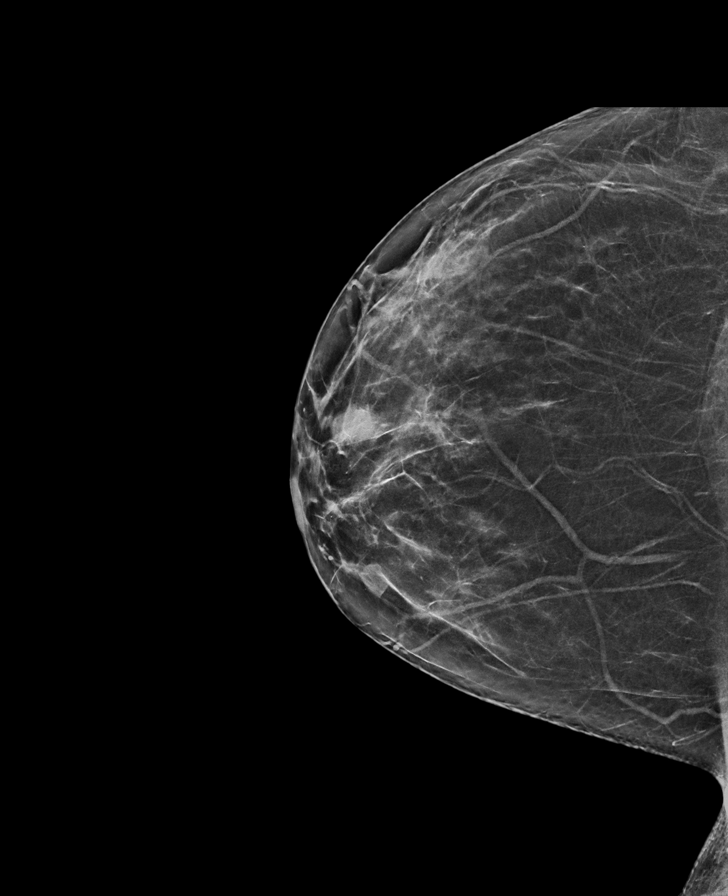

[L MLO synth-2D]
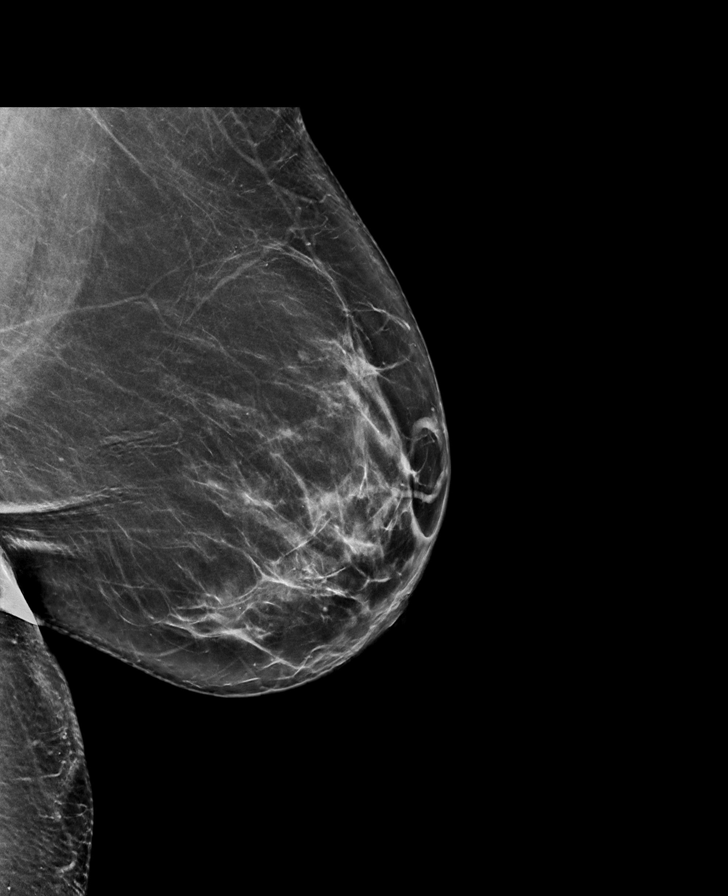

[R MLO synth-2D (2 of 2)]
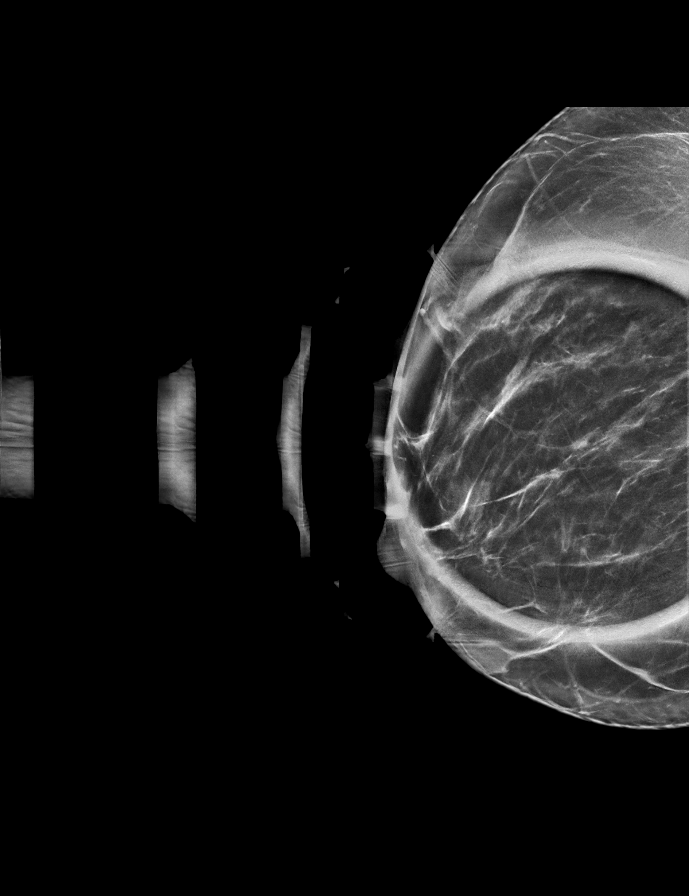

[R ML synth-2D]
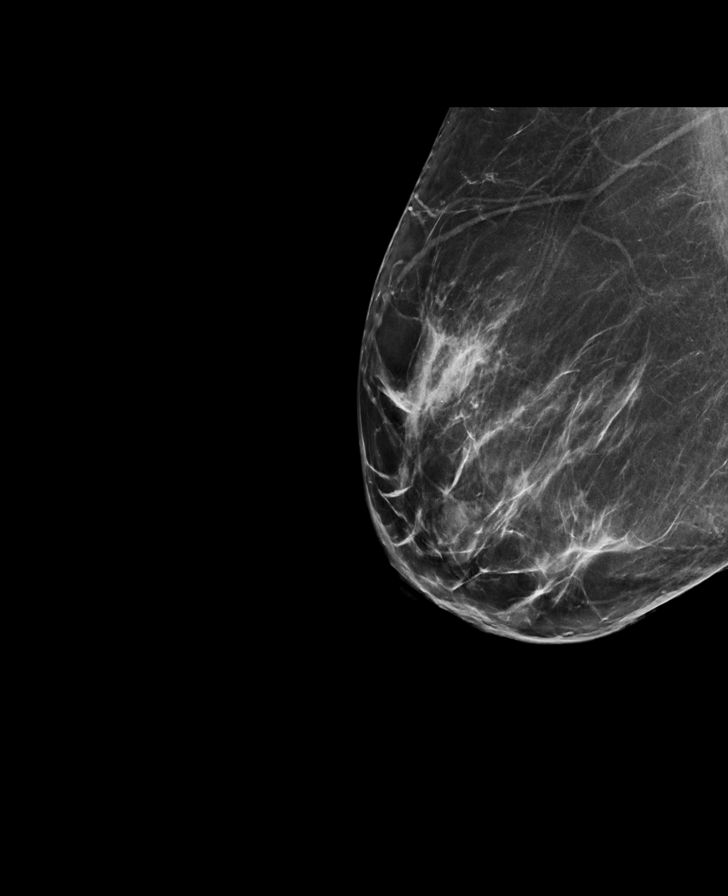

[R CC tomo · tomo slice 34/67.0]
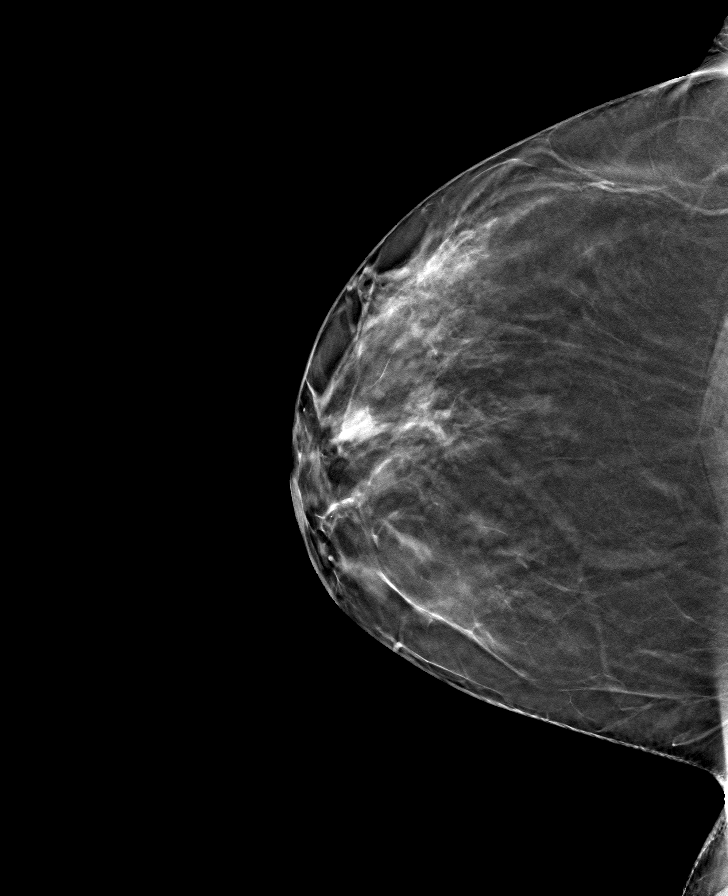

[8 of 40 positions shown; findings below may reference images not displayed]

Correlation made with chest CT images from 08/06/2019.

ACR Breast Density Category c: The breast tissue is heterogeneously
dense, which may obscure small masses.
FINDINGS: Tomosynthesis images of the right breast demonstrates an oval
obscured mass in the lateral retroareolar right breast measuring
approximately 1.3 cm. Another oval mass which is circumscribed is
seen in the lower-inner quadrant of the right breast which measures
9 mm.

Mammographic images were processed with CAD.

Ultrasound targeted to the retroareolar right breast at 3 o'clock
demonstrates a circumscribed oval hypoechoic mass measuring 0.8 x
0.5 x 0.7 cm.

Ultrasound of the right breast at 9 o'clock, 2 cm from the nipple
demonstrates a similar-appearing oval circumscribed hypoechoic mass
measuring 1.7 x 0.8 x 0.7 cm.
IMPRESSION: 1. There are 2 likely benign masses in the right breast, one at 3
o'clock and another at 9 o'clock. The 9 o'clock mass corresponds
with the mass identified on the patient's CT exam. These both likely
represent benign fibroadenomas.

2.  No evidence of malignancy in the bilateral breasts.

RECOMMENDATION:
Six-month follow-up right breast ultrasound to monitor the 2 likely
benign right breast masses.

I have discussed the findings and recommendations with the patient.
If applicable, a reminder letter will be sent to the patient
regarding the next appointment.

BI-RADS CATEGORY  2: Benign.

## 2020-10-27 ENCOUNTER — Telehealth: Payer: Self-pay | Admitting: Hematology and Oncology

## 2020-10-27 ENCOUNTER — Encounter: Payer: Self-pay | Admitting: Family Medicine

## 2020-10-27 ENCOUNTER — Ambulatory Visit
Admission: RE | Admit: 2020-10-27 | Discharge: 2020-10-27 | Disposition: A | Discharge status: Home / Self Care | Payer: Medicaid Other | Source: Ambulatory Visit | Attending: Family Medicine | Admitting: Family Medicine

## 2020-10-27 ENCOUNTER — Ambulatory Visit (INDEPENDENT_AMBULATORY_CARE_PROVIDER_SITE_OTHER): Payer: Medicaid Other | Admitting: Family Medicine

## 2020-10-27 ENCOUNTER — Other Ambulatory Visit: Payer: Self-pay

## 2020-10-27 VITALS — BP 121/80 | HR 93 | Ht 63.07 in | Wt 192.8 lb

## 2020-10-27 DIAGNOSIS — Z0000 Encounter for general adult medical examination without abnormal findings: Secondary | ICD-10-CM | POA: Diagnosis present

## 2020-10-27 DIAGNOSIS — M25521 Pain in right elbow: Secondary | ICD-10-CM

## 2020-10-27 DIAGNOSIS — M25561 Pain in right knee: Secondary | ICD-10-CM

## 2020-10-27 DIAGNOSIS — M25562 Pain in left knee: Secondary | ICD-10-CM | POA: Diagnosis not present

## 2020-10-27 DIAGNOSIS — M1712 Unilateral primary osteoarthritis, left knee: Secondary | ICD-10-CM | POA: Diagnosis not present

## 2020-10-27 DIAGNOSIS — G8929 Other chronic pain: Secondary | ICD-10-CM

## 2020-10-27 DIAGNOSIS — Z23 Encounter for immunization: Secondary | ICD-10-CM | POA: Diagnosis not present

## 2020-10-27 DIAGNOSIS — R0789 Other chest pain: Secondary | ICD-10-CM

## 2020-10-27 DIAGNOSIS — M25722 Osteophyte, left elbow: Secondary | ICD-10-CM | POA: Diagnosis not present

## 2020-10-27 DIAGNOSIS — M25522 Pain in left elbow: Secondary | ICD-10-CM | POA: Diagnosis not present

## 2020-10-27 DIAGNOSIS — M778 Other enthesopathies, not elsewhere classified: Secondary | ICD-10-CM | POA: Diagnosis not present

## 2020-10-27 NOTE — Patient Instructions (Addendum)
Please go to [facility] for xrays of your knees and elbows.   I will notify you of the results. You do not need an appointment for your xrays.   You can continue to use the voltaren gel for your knees and elbows.   Please follow up with me in 2 weeks for your knees and elbows.   Chronic Knee Pain, Adult Knee pain that lasts longer than 3 months is called chronic knee pain. You may have pain in one or both knees. Symptoms of chronic knee pain may also include swelling and stiffness. The most common cause is age-related wear and tear (osteoarthritis) of your knee joint. Many conditions can cause chronic knee pain. Treatment depends on the cause. The main treatments are physical therapy and weight loss. It may also be treated with medicines, injections, a knee sleeve or brace, and by using crutches. Rest, ice, pressure (compression), and elevation, also known as RICE therapy, may also be recommended. Follow these instructions at home: If you have a knee sleeve or brace:  Wear the knee sleeve or brace as told by your doctor. Take it off only as told by your doctor. Loosen it if your toes: Tingle. Become numb. Turn cold and blue. Keep it clean. If the sleeve or brace is not waterproof: Do not let it get wet. Ask your doctor if you may take it off when you take a bath or shower. If not, cover it with a watertight covering. Managing pain, stiffness, and swelling   If told, put heat on your knee. Do this as often as told by your doctor. Use the heat source that your doctor recommends, such as a moist heat pack or a heating pad. If you have a removable knee sleeve or brace, take it off as told by your doctor. Place a towel between your skin and the heat source. Leave the heat on for 20-30 minutes. Take off the heat if your skin turns bright red. This is very important. If you cannot feel pain, heat, or cold, you have a greater risk of getting burned. If told, put ice on your knee. To do  this: If you have a removable knee sleeve or brace, take it off as told by your doctor. Put ice in a plastic bag. Place a towel between your skin and the bag. Leave the ice on for 20 minutes, 2-3 times a day. Take off the ice if your skin turns bright red. This is very important. If you cannot feel pain, heat, or cold, you have a greater risk of damage to the area. Move your toes often. Raise the injured area above the level of your heart while you are sitting or lying down. Activity Avoid activities where both feet leave the ground at the same time (high-impact activities). Examples are running, jumping rope, and doing jumping jacks. Follow the exercise plan that your doctor makes for you. Your doctor may suggest that you: Avoid activities that make knee pain worse. You may need to change the exercises that you do, the sports that you participate in, or your job duties. Wear shoes with cushioned soles. Avoid sports that require running and sudden changes in direction. Do exercises or physical therapy. This is planned to match your needs and your abilities. Do exercises that increase your balance and strength, such as tai chi and yoga. Do not use your injured knee to support your body weight until your doctor says that you can. Use crutches as told by your  doctor. Return to your normal activities when your doctor says that it is safe. General instructions Take over-the-counter and prescription medicines only as told by your doctor. If you are overweight, work with your doctor and a food expert (dietitian) to set goals to lose weight. Being overweight can make your knee hurt more. Do not smoke or use any products that contain nicotine or tobacco. If you need help quitting, ask your doctor. Keep all follow-up visits. Contact a doctor if: You have knee pain that is not getting better or gets worse. You are not able to do your exercises due to knee pain. Get help right away if: Your knee  swells and the swelling gets worse. You cannot move your knee. You have very bad knee pain. Summary Knee pain that lasts more than 3 months is called chronic knee pain. The main treatments for chronic knee pain are physical therapy and weight loss. You may also need to take medicines, wear a knee sleeve or brace, use crutches, and put ice or heat on your knee. Lose weight if you are overweight. Work with your doctor and a food expert (dietitian) to help you set goals to lose weight. Being overweight can make your knee hurt more. Follow the exercise plan that your doctor makes for you. This information is not intended to replace advice given to you by your health care provider. Make sure you discuss any questions you have with your health care provider. Document Revised: 06/13/2019 Document Reviewed: 06/13/2019 Elsevier Patient Education  2022 [employer].

## 2020-10-27 NOTE — Progress Notes (Signed)
SUBJECTIVE:   CHIEF COMPLAINT / HPI: follow up for chest pain and HTN   Chest pain f/u  Patient was evaluated at cardiology on 9/9 and diagnosed with atypical chest pain. Her elevated TG levels were also noted. She was recommended to continue lifestyle changes and follow up with PCP. Today she reports no more episodes of chest pain and that she is working to improve her diet to control her cholesterol. Patient confirms no new medications were added nor additional studies were recommended at this time.    Knee and Elbow Pain  Patient does not work outside of the home but states she has 13-16 steps  in her home that she uses multiple times throughout the day to increase her physical activity. She states that is constantly up and down the staris to do laundry and clean the home. She reports having bilateral knee pain that affects her when walking and using the stairs. She has not noticed any redness or swelling of her knees. She also states that she had a break in her left elbow when she was younger but states she has noticed pain in both of her elbows that seems to be increasing. She has not noticed any swelling. She denies any recent injuries or falls involving her knees or elbows. She has tried applying heat to her joints for pain as she avoids NSAIDS due to her CKD.   PERTINENT  PMH / PSH:  HTN  Atypical Chest Pain   OBJECTIVE:   BP 121/80   Pulse 93   Ht 5' 3.07" (1.602 m)   Wt 192 lb 12.8 oz (87.5 kg)   SpO2 96%   BMI 34.08 kg/m   General: female appearing stated age in no acute distress Cardio: Normal S1 and S2, no S3 or S4. Rhythm is regular. No murmurs or rubs.  Bilateral radial pulses palpable Pulm: Clear to auscultation bilaterally, no crackles, wheezing, or diminished breath sounds. Normal respiratory effort, stable on RA Abdomen: Bowel sounds normal. Abdomen soft and non-tender.  Extremities: No peripheral edema  R/L Knee: - Inspection: no gross deformity. No  swelling/effusion, erythema or bruising. Skin intact - Palpation: no TTP - ROM: full active ROM with flexion and extension in knees and hips - Strength: 5/5 strength - Neuro/vasc: NV intact - Special Tests: - LIGAMENTS: negative anterior and posterior drawer, negative Lachman's, no MCL or LCL laxity  -- MENISCUS:  +Thessaly  -- PF JOINT: nml patellar mobility bilaterally.  negative patellar grind, negative patellar apprehension   ASSESSMENT/PLAN:   Bilateral chronic knee pain Patient reports knee pain has been present for several months progressively intensifying over this year. Suspect potential arthritis but multiple joints raises some concern for rheumatologic involvement. Will start with xrays and consider referral to sports medicine. Will not recommend NSAIDS as this time due to CKD. Patient encouraged to continue conservative measures for pain relief and rest from activities that exacerbate her knee pain. Also noted +Thessaly on exam with strong consideration for meniscus degeneration   Atypical chest pain Resolved. Patient evaluated by cardiology with no additional studies or medications recommended at this time. Patient is to continue with lifestyle modifications.   Bilateral elbow joint pain Suspect element of arthritic changes given hx of trauma/fracture in left elbow. Unclear etiology of right sided elbow pain. Will start with xrays today and recommend continued conservative management.  F/u in 2 weeks   Healthcare maintenance Patient agreeable to Tetanus and influenza vaccination today.  Eulis Foster, MD [place]

## 2020-10-31 ENCOUNTER — Other Ambulatory Visit: Payer: Self-pay | Admitting: Family Medicine

## 2020-10-31 DIAGNOSIS — M25562 Pain in left knee: Secondary | ICD-10-CM | POA: Insufficient documentation

## 2020-10-31 DIAGNOSIS — I10 Essential (primary) hypertension: Secondary | ICD-10-CM

## 2020-10-31 DIAGNOSIS — M25521 Pain in right elbow: Secondary | ICD-10-CM | POA: Insufficient documentation

## 2020-10-31 DIAGNOSIS — G8929 Other chronic pain: Secondary | ICD-10-CM | POA: Insufficient documentation

## 2020-10-31 NOTE — Assessment & Plan Note (Signed)
Patient agreeable to Tetanus and influenza vaccination today.

## 2020-10-31 NOTE — Assessment & Plan Note (Signed)
Resolved. Patient evaluated by cardiology with no additional studies or medications recommended at this time. Patient is to continue with lifestyle modifications.

## 2020-10-31 NOTE — Assessment & Plan Note (Signed)
Suspect element of arthritic changes given hx of trauma/fracture in left elbow. Unclear etiology of right sided elbow pain. Will start with xrays today and recommend continued conservative management.  F/u in 2 weeks

## 2020-10-31 NOTE — Assessment & Plan Note (Addendum)
Patient reports knee pain has been present for several months progressively intensifying over this year. Suspect potential arthritis but multiple joints raises some concern for rheumatologic involvement. Will start with xrays and consider referral to sports medicine. Will not recommend NSAIDS as this time due to CKD. Patient encouraged to continue conservative measures for pain relief and rest from activities that exacerbate her knee pain. Also noted +Thessaly on exam with strong consideration for meniscus degeneration

## 2020-11-11 ENCOUNTER — Telehealth: Payer: Self-pay | Admitting: Physician Assistant

## 2020-11-11 ENCOUNTER — Ambulatory Visit: Payer: Medicaid Other | Admitting: Physician Assistant

## 2020-11-11 ENCOUNTER — Other Ambulatory Visit: Payer: Medicaid Other

## 2020-11-11 NOTE — Telephone Encounter (Signed)
Called patient regarding rescheduled November appointments. Left a voicemail.

## 2020-11-18 ENCOUNTER — Other Ambulatory Visit: Payer: Self-pay | Admitting: Physician Assistant

## 2020-11-18 DIAGNOSIS — D72829 Elevated white blood cell count, unspecified: Secondary | ICD-10-CM

## 2020-11-18 DIAGNOSIS — D649 Anemia, unspecified: Secondary | ICD-10-CM

## 2020-11-19 ENCOUNTER — Inpatient Hospital Stay: Payer: Medicaid Other | Attending: Hematology and Oncology

## 2020-11-19 ENCOUNTER — Inpatient Hospital Stay (HOSPITAL_BASED_OUTPATIENT_CLINIC_OR_DEPARTMENT_OTHER): Payer: Medicaid Other | Admitting: Physician Assistant

## 2020-11-19 ENCOUNTER — Other Ambulatory Visit: Payer: Self-pay

## 2020-11-19 VITALS — BP 119/78 | HR 94 | Temp 97.9°F | Resp 16 | Ht 63.0 in | Wt 193.9 lb

## 2020-11-19 DIAGNOSIS — N189 Chronic kidney disease, unspecified: Secondary | ICD-10-CM | POA: Diagnosis not present

## 2020-11-19 DIAGNOSIS — D649 Anemia, unspecified: Secondary | ICD-10-CM | POA: Diagnosis not present

## 2020-11-19 DIAGNOSIS — E538 Deficiency of other specified B group vitamins: Secondary | ICD-10-CM | POA: Diagnosis not present

## 2020-11-19 DIAGNOSIS — D72829 Elevated white blood cell count, unspecified: Secondary | ICD-10-CM | POA: Diagnosis not present

## 2020-11-19 DIAGNOSIS — R748 Abnormal levels of other serum enzymes: Secondary | ICD-10-CM | POA: Insufficient documentation

## 2020-11-19 LAB — CMP (CANCER CENTER ONLY)
ALT: 30 U/L (ref 0–44)
AST: 23 U/L (ref 15–41)
Albumin: 3.2 g/dL — ABNORMAL LOW (ref 3.5–5.0)
Alkaline Phosphatase: 148 U/L — ABNORMAL HIGH (ref 38–126)
Anion gap: 11 (ref 5–15)
BUN: 27 mg/dL — ABNORMAL HIGH (ref 6–20)
CO2: 20 mmol/L — ABNORMAL LOW (ref 22–32)
Calcium: 8.2 mg/dL — ABNORMAL LOW (ref 8.9–10.3)
Chloride: 107 mmol/L (ref 98–111)
Creatinine: 2.24 mg/dL — ABNORMAL HIGH (ref 0.44–1.00)
GFR, Estimated: 28 mL/min — ABNORMAL LOW (ref >60)
Glucose, Bld: 92 mg/dL (ref 70–99)
Potassium: 3.9 mmol/L (ref 3.5–5.1)
Sodium: 138 mmol/L (ref 135–145)
Total Bilirubin: 0.3 mg/dL (ref 0.3–1.2)
Total Protein: 7 g/dL (ref 6.5–8.1)

## 2020-11-19 LAB — VITAMIN B12: Vitamin B-12: 840 pg/mL (ref 180–914)

## 2020-11-19 LAB — CBC WITH DIFFERENTIAL (CANCER CENTER ONLY)
Abs Immature Granulocytes: 0.09 10*3/uL — ABNORMAL HIGH (ref 0.00–0.07)
Basophils Absolute: 0.1 10*3/uL (ref 0.0–0.1)
Basophils Relative: 0 %
Eosinophils Absolute: 0.2 10*3/uL (ref 0.0–0.5)
Eosinophils Relative: 1 %
HCT: 34.8 % — ABNORMAL LOW (ref 36.0–46.0)
Hemoglobin: 11.5 g/dL — ABNORMAL LOW (ref 12.0–15.0)
Immature Granulocytes: 1 %
Lymphocytes Relative: 28 %
Lymphs Abs: 4.9 10*3/uL — ABNORMAL HIGH (ref 0.7–4.0)
MCH: 26.7 pg (ref 26.0–34.0)
MCHC: 33 g/dL (ref 30.0–36.0)
MCV: 80.7 fL (ref 80.0–100.0)
Monocytes Absolute: 0.9 10*3/uL (ref 0.1–1.0)
Monocytes Relative: 5 %
Neutro Abs: 11.1 10*3/uL — ABNORMAL HIGH (ref 1.7–7.7)
Neutrophils Relative %: 65 %
Platelet Count: 397 10*3/uL (ref 150–400)
RBC: 4.31 MIL/uL (ref 3.87–5.11)
RDW: 13.7 % (ref 11.5–15.5)
WBC Count: 17.3 10*3/uL — ABNORMAL HIGH (ref 4.0–10.5)
nRBC: 0 % (ref 0.0–0.2)

## 2020-11-19 LAB — C-REACTIVE PROTEIN: CRP: 4.3 mg/dL — ABNORMAL HIGH (ref <1.0)

## 2020-11-19 LAB — SEDIMENTATION RATE: Sed Rate: 56 mm/hr — ABNORMAL HIGH (ref 0–22)

## 2020-11-20 NOTE — Progress Notes (Signed)
[facility]  PROGRESS NOTE  Patient Care Team: Eulis Foster, MD as PCP - General (Family Medicine)  CHIEF COMPLAINTS/PURPOSE OF CONSULTATION:  Leukocytosis  INTERVAL HISTORY:   Zoe Woodard 40 y.o. female returns for follow-up for leukocytosis.  On exam today, Ms. Grefe reports that her energy levels are fairly stable.  She continues to complete her ADLs independently.  She denies any changes to her appetite or weight recently.  Denies any nausea, vomiting or abdominal pain.  Her bowel habits are unchanged without any diarrhea or constipation.  Patient reports that she does snore periodically and feels unrested after a full night of sleep.  She reports diffuse joint pain that has worsened over the last 2 months.  She denies any fevers, chills, night sweats, chest pain, dyspnea on exertion or cough.  She has no other complaints.  Rest of the 10 point ROS is below.   MEDICAL HISTORY:  Past Medical History:  Diagnosis Date   Abdominal pain 12/28/2019   Back pain 10/10/2019   Chronic kidney disease    Ear pain, bilateral 04/15/2020   Hyperlipidemia    Hypertensive urgency 07/22/2019   Hypocalcemia 08/22/2019   Low blood pressure 02/07/2020   Obesity    Pyelonephritis 11/08/2019    SURGICAL HISTORY: Past Surgical History:  Procedure Laterality Date   ABDOMINAL HYSTERECTOMY     APPENDECTOMY     bladder laceration  01/04/2015   excision of thyroglobal duct cyst  02/01/2012   right ovarian cystoectomy  01/02/2015   right ovarian egstectomy  04/27/2013   SPINE SURGERY     TONSILLECTOMY     TUBAL LIGATION      SOCIAL HISTORY: Social History   Socioeconomic History   Marital status: Single    Spouse name: Not on file   Number of children: Not on file   Years of education: Not on file   Highest education level: Not on file  Occupational History   Not on file  Tobacco Use   Smoking status: Never   Smokeless tobacco: Never  Vaping Use   Vaping  Use: Never used  Substance and Sexual Activity   Alcohol use: No   Drug use: No   Sexual activity: Not Currently    Partners: Male    Birth control/protection: Surgical  Other Topics Concern   Not on file  Social History Narrative   Lives with 3 children    Not working currently   Not sexually active.    Social Determinants of Health   Financial Resource Strain: Not on file  Food Insecurity: Not on file  Transportation Needs: Not on file  Physical Activity: Not on file  Stress: Not on file  Social Connections: Not on file  Intimate Partner Violence: Not on file    FAMILY HISTORY: Family History  Problem Relation Age of Onset   Diabetes Mother    Healthy Father     ALLERGIES:  has No Known Allergies.  MEDICATIONS:  Current Outpatient Medications  Medication Sig Dispense Refill   benzonatate (TESSALON) 100 MG capsule Take 1 capsule (100 mg total) by mouth 2 (two) times daily as needed. 180 capsule 2   diclofenac Sodium (VOLTAREN) 1 % GEL Apply 4 g topically 4 (four) times daily. 150 g 1   famotidine (PEPCID) 20 MG tablet TAKE 1 TABLET BY MOUTH TWICE A DAY 60 tablet 3   loratadine (CLARITIN) 10 MG tablet TAKE 1 TABLET BY MOUTH EVERY DAY 30 tablet 11   losartan (  COZAAR) 50 MG tablet Take 50 mg by mouth daily.     metoprolol tartrate (LOPRESSOR) 25 MG tablet TAKE 1 TABLET BY MOUTH TWICE A DAY 120 tablet 1   montelukast (SINGULAIR) 10 MG tablet TAKE 1 TABLET BY MOUTH EVERYDAY AT BEDTIME 90 tablet 1   polyethylene glycol powder (GLYCOLAX/MIRALAX) 17 GM/SCOOP powder Take 17 g by mouth daily as needed. 510 g 1   vitamin B-12 (CYANOCOBALAMIN) 1000 MCG tablet Take 1,000 mcg by mouth daily.     No current facility-administered medications for this visit.     PHYSICAL EXAMINATION: ECOG PERFORMANCE STATUS: 0 - Asymptomatic  Vitals:   11/19/20 1430  BP: 119/78  Pulse: 94  Resp: 16  Temp: 97.9 F (36.6 C)  SpO2: 100%    Filed Weights   11/19/20 1430  Weight: 193 lb  14.4 oz (88 kg)   Physical Exam Constitutional:      Appearance: Normal appearance. She is not ill-appearing.  HENT:     Head: Normocephalic and atraumatic.     Mouth/Throat:     Mouth: Mucous membranes are moist.     Pharynx: Oropharynx is clear.  Eyes:     General: No scleral icterus.    Extraocular Movements: Extraocular movements intact.  Cardiovascular:     Rate and Rhythm: Normal rate and regular rhythm.  Pulmonary:     Effort: Pulmonary effort is normal.     Breath sounds: Normal breath sounds.  Abdominal:     General: Abdomen is flat. Bowel sounds are normal. There is no distension.     Tenderness: There is no abdominal tenderness.  Musculoskeletal:        General: No swelling or tenderness.     Cervical back: Normal range of motion and neck supple.  Skin:    General: Skin is warm and dry.     Coloration: Skin is not jaundiced.  Neurological:     General: No focal deficit present.     Mental Status: She is alert and oriented to person, place, and time.  Psychiatric:        Mood and Affect: Mood normal.     LABORATORY DATA:  I have reviewed the data as listed Lab Results  Component Value Date   WBC 17.3 (H) 11/19/2020   HGB 11.5 (L) 11/19/2020   HCT 34.8 (L) 11/19/2020   MCV 80.7 11/19/2020   PLT 397 11/19/2020     Chemistry      Component Value Date/Time   NA 138 11/19/2020 1418   NA 139 02/07/2020 1116   K 3.9 11/19/2020 1418   CL 107 11/19/2020 1418   CO2 20 (L) 11/19/2020 1418   BUN 27 (H) 11/19/2020 1418   BUN 27 (H) 02/07/2020 1116   CREATININE 2.24 (H) 11/19/2020 1418      Component Value Date/Time   CALCIUM 8.2 (L) 11/19/2020 1418   ALKPHOS 148 (H) 11/19/2020 1418   AST 23 11/19/2020 1418   ALT 30 11/19/2020 1418   BILITOT 0.3 11/19/2020 1418     I have reviewed pertinent labs from last visit.  We will repeat CBC, CMP and flow today.  RADIOGRAPHIC STUDIES: I have personally reviewed the radiological images as listed and agreed with  the findings in the report. DG Elbow Complete Left  Result Date: 10/28/2020 CLINICAL DATA:  Arthralgia of both elbows. EXAM: LEFT ELBOW - COMPLETE 3+ VIEW COMPARISON:  None. FINDINGS: The K-wire traverses the distal lateral humerus. No surrounding lucency. Slight posterior angulation of  the distal humerus is suggestive of remote supracondylar fracture. No acute fracture. Trace spurring of the radial head. The joint spaces are preserved. No erosion or bony destruction. No elbow joint effusion. Unremarkable soft tissues. IMPRESSION: 1. Minimal spurring of the radial head. 2. Findings suggestive of remote supracondylar fracture with K-wire in the lateral distal humerus. Electronically Signed   By: Keith Rake M.D.   On: 10/28/2020 12:36   DG Elbow Complete Right  Result Date: 10/28/2020 CLINICAL DATA:  Arthralgia of both elbows.  Bilateral elbow pain. EXAM: RIGHT ELBOW - COMPLETE 3+ VIEW COMPARISON:  None. FINDINGS: Normal alignment. Normal joint spaces. No osteophyte or erosion. No periosteal reaction. No elbow joint effusion. Unremarkable soft tissues. IMPRESSION: Negative radiographs of the right elbow. Electronically Signed   By: Keith Rake M.D.   On: 10/28/2020 12:37   DG Knee AP/LAT W/Sunrise Left  Result Date: 10/28/2020 CLINICAL DATA:  Chronic bilateral knee pain.  No known injury. EXAM: LEFT KNEE 3 VIEWS COMPARISON:  None. FINDINGS: Normal joint spaces. Normal alignment. There is trace patellofemoral spurring. Minimal spurring of the tibial spines. Diminutive knee joint effusion. No fracture, erosion, or focal bone abnormality. Unremarkable soft tissues. IMPRESSION: Minimal degenerative change with trace knee joint effusion. Electronically Signed   By: Keith Rake M.D.   On: 10/28/2020 12:34   DG Knee AP/LAT W/Sunrise Right  Result Date: 10/28/2020 CLINICAL DATA:  Chronic bilateral knee pain.  No known injury. EXAM: RIGHT KNEE 3 VIEWS COMPARISON:  None. FINDINGS: Normal joint  spaces and alignment. Trace patellar spurring. No knee joint effusion. No fracture, erosion, or focal bone abnormality. Unremarkable soft tissues. IMPRESSION: Trace patellar spurring. Electronically Signed   By: Keith Rake M.D.   On: 10/28/2020 12:35     ASSESSMENT & PLAN:  Gerene Nedd is a 40 y.o. female who presents to the clinic for evaluation for   1.  Leukocytosis, neutrophil predominant: --Workup so far has included MPN panel and BCR/ABL FISH that was negative.  --Patient denies any infectious symptoms.  --Patient denies smoking. --Patient adds that she does snore and does not feel she gets restful sleep. Plan to refer patient to sleep center to rule out obstructive sleep apnea.  --Labs today show WBC 17.3, ANC 11.1.  --Inflammatory markers were elevated today and patient c/o diffuse joint pain that she has been reporting for the last two months. We will send referral to rheumatology for further evaluation --If rheumatology workup is negative, we will proceed with bone marrow biopsy next.  --RTC in 4 weeks with labs to see Dr. Lorenso Courier.   2.  B12 deficiency: --Labs today show vitamin B12 levels within normal limits. MMA level is pending. --Currently on B12 supplementation 1000 mcg daily.  Recommend to continue.   3.  Normocytic anemia, mild: --Hgb is 11.5, likely related to chronic kidney disease.  --Continue to monitor.   4.  Elevated alkaline phosphatase: --Mildly elevated and overall stable over the last several months.  -- continue to monitor this on future labs.  5. Atypical chest pain: --Mainly with exertion, points to the left side and some times travels to the arm. --Patient was evaluated by cardiologist on 09/19/2020. At this time, monitor for now and advised to work on a better diet.  -Advised patient to go to the emergency room if  chest pain worsens.    All questions were answered. The patient knows to call the clinic with any problems, questions or  concerns.  I have spent a total  of 30 minutes minutes of face-to-face and non-face-to-face time, preparing to see the patient, obtaining and/or reviewing separately obtained history, performing a medically appropriate examination, counseling and educating the patient, ordering tests, referring and communicating with other health care professionals, documenting clinical information in the electronic health record, independently interpreting results and communicating results to the patient, and care coordination.   [facility] [place] P: [phone]

## 2020-11-21 ENCOUNTER — Encounter: Payer: Self-pay | Admitting: Family Medicine

## 2020-11-21 ENCOUNTER — Ambulatory Visit (INDEPENDENT_AMBULATORY_CARE_PROVIDER_SITE_OTHER): Payer: Medicaid Other | Admitting: Family Medicine

## 2020-11-21 ENCOUNTER — Other Ambulatory Visit: Payer: Self-pay

## 2020-11-21 DIAGNOSIS — G8929 Other chronic pain: Secondary | ICD-10-CM | POA: Diagnosis not present

## 2020-11-21 DIAGNOSIS — M25521 Pain in right elbow: Secondary | ICD-10-CM | POA: Diagnosis not present

## 2020-11-21 DIAGNOSIS — M25561 Pain in right knee: Secondary | ICD-10-CM | POA: Diagnosis not present

## 2020-11-21 DIAGNOSIS — M25522 Pain in left elbow: Secondary | ICD-10-CM | POA: Diagnosis not present

## 2020-11-21 DIAGNOSIS — R0789 Other chest pain: Secondary | ICD-10-CM

## 2020-11-21 DIAGNOSIS — M25562 Pain in left knee: Secondary | ICD-10-CM | POA: Diagnosis not present

## 2020-11-21 MED ORDER — DICLOFENAC SODIUM 1 % EX GEL: 4.0000 g | Freq: Four times a day (QID) | CUTANEOUS | 6 refills | Status: DC

## 2020-11-21 MED ORDER — LIDOCAINE 4 % EX PTCH: 1.0000 | MEDICATED_PATCH | Freq: Four times a day (QID) | CUTANEOUS | 2 refills | Status: DC

## 2020-11-21 MED ORDER — ASPER-FLEX 10 % EX CREA: 1.0000 "application " | TOPICAL_CREAM | CUTANEOUS | 0 refills | Status: DC | PRN

## 2020-11-21 NOTE — Patient Instructions (Signed)
I will send a prescription for lidocaine patches to see if it helps with your joint pains in addition to refills for the voltaren gel.    Please let me know if you would like to pursue a referral to physical therapy in the future once everything settles with your specialty care.

## 2020-11-21 NOTE — Progress Notes (Signed)
    SUBJECTIVE:   CHIEF COMPLAINT / HPI: f/u knee and elbow pain   Patient reports continued knee and elbow pain. She has not noticed much improvement with conservative measures.   PERTINENT  PMH / PSH:  HTN  CKD   OBJECTIVE:   BP 128/88   Pulse 70   Ht 5\' 3"  (1.6 m)   Wt 192 lb (87.1 kg)   BMI 34.01 kg/m   General: female appearing stated age in no acute distress Cardio: Normal S1 and S2, no S3 or S4. Rhythm is regular. No murmurs or rubs.  Bilateral radial pulses palpable Pulm: Clear to auscultation bilaterally, no crackles, wheezing, or diminished breath sounds. Normal respiratory effort, stable on RA Abdomen: Bowel sounds normal. Abdomen soft and non-tender.  Extremities: No peripheral edema. Warm/ well perfused. Normal ROM of bilateral elbows and knees, no swelling, no erythema nor warmth to touch    ASSESSMENT/PLAN:   Bilateral chronic knee pain Patient wishes to hold off on further referrals to sports med as well as steroid injections into joints as she has a lot of referrals to follow up on for her other medical conditions  RX lidocaine patch for knees and elbows along with voltaren gel and asper cream   Bilateral elbow joint pain RX lidocaine patches  voltaren gel  Consider sports med/orthopedic referral in future if pain persists and once patient has been able to take care of other medical issues and referrals      Eulis Foster, MD [place]

## 2020-11-23 LAB — METHYLMALONIC ACID, SERUM: Methylmalonic Acid, Quantitative: 271 nmol/L (ref 0–378)

## 2020-11-26 NOTE — Assessment & Plan Note (Signed)
RX lidocaine patches  voltaren gel  Consider sports med/orthopedic referral in future if pain persists and once patient has been able to take care of other medical issues and referrals

## 2020-11-26 NOTE — Assessment & Plan Note (Signed)
Patient wishes to hold off on further referrals to sports med as well as steroid injections into joints as she has a lot of referrals to follow up on for her other medical conditions  RX lidocaine patch for knees and elbows along with voltaren gel and asper cream

## 2020-12-12 ENCOUNTER — Other Ambulatory Visit: Payer: Self-pay

## 2020-12-12 ENCOUNTER — Encounter (HOSPITAL_COMMUNITY): Payer: Self-pay

## 2020-12-12 ENCOUNTER — Ambulatory Visit (HOSPITAL_COMMUNITY)
Admission: EM | Admit: 2020-12-12 | Discharge: 2020-12-12 | Disposition: A | Discharge status: Home / Self Care | Payer: Medicaid Other | Attending: Student | Admitting: Student

## 2020-12-12 DIAGNOSIS — H1131 Conjunctival hemorrhage, right eye: Secondary | ICD-10-CM

## 2020-12-12 MED ORDER — ERYTHROMYCIN 5 MG/GM OP OINT: TOPICAL_OINTMENT | OPHTHALMIC | 0 refills | Status: DC

## 2020-12-12 NOTE — Discharge Instructions (Addendum)
-  You have a small burst blood vessel in your right eye.  This is not a problem, and usually gets better on its own, but because your eyes are also very itchy I am worried that you are getting an infection. -We are treating it with an antibiotic ointment called erythromycin.  Use this once nightly for about 7 days.  Pull down the lower eyelid, and place about half an inch inside.  This will be messy, so press the remaining ointment around the eye.  You can wash your face with gentle soap and water in the morning to wash off any remaining ointment. -Warm compresses -Seek additional medical attention if symptoms get worse, like eye pain, eye lid swelling, vision changes. Follow-up with your eye doctor if possible, but we're also happy to see you!

## 2020-12-12 NOTE — ED Provider Notes (Signed)
[place]    CSN: [id number] Arrival date & time: 12/12/20  1652      History   Chief Complaint Chief Complaint  Patient presents with   eye irritation    HPI Zoe Woodard is a 40 y.o. female presenting with eye irritation for 2 days.  Medical history noncontributory, wears glasses but not contacts.  States that she first developed irritation and redness of the right eye, now with left eye irritation as well.  Scant discharge in the morning.  Denies foreign body sensation or trauma to the eye.  Feeling well otherwise, denies fever/chills, cough, congestion.  Denies photophobia, foreign body sensation,eye pain, eye pain with movement, injury to eye, vision changes, double vision, excessive tearing, burning eyes. Has tried OTC drops without improvement.    HPI  Past Medical History:  Diagnosis Date   Abdominal pain 12/28/2019   Back pain 10/10/2019   Chronic kidney disease    Ear pain, bilateral 04/15/2020   Hyperlipidemia    Hypertensive urgency 07/22/2019   Hypocalcemia 08/22/2019   Low blood pressure 02/07/2020   Obesity    Pyelonephritis 11/08/2019    Patient Active Problem List   Diagnosis Date Noted   Bilateral chronic knee pain 10/31/2020   Bilateral elbow joint pain 10/31/2020   Hypertriglyceridemia 08/09/2020   Myalgia due to statin 08/06/2020   Bilateral headaches 07/08/2020   Adnexal cyst 04/15/2020   Atypical chest pain 03/13/2020   COVID-19 02/07/2020   Lower abdominal pain 12/28/2019   Leukocytosis 12/28/2019   Guyon syndrome, 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   CKD (chronic kidney disease) stage 4, GFR 15-29 ml/min ([place]) 10/10/2019   Hyperlipidemia 08/22/2019   Lung nodule 08/22/2019   Breast mass 08/22/2019   Chronic cough 08/05/2019   Essential hypertension 07/22/2019   Healthcare maintenance 08/04/2016    Past Surgical History:  Procedure Laterality Date   ABDOMINAL HYSTERECTOMY     APPENDECTOMY     bladder laceration  01/04/2015    excision of thyroglobal duct cyst  02/01/2012   right ovarian cystoectomy  01/02/2015   right ovarian egstectomy  04/27/2013   SPINE SURGERY     TONSILLECTOMY     TUBAL LIGATION      OB History     Gravida  3   Para      Term      Preterm      AB      Living  3      SAB      IAB      Ectopic      Multiple      Live Births               Home Medications    Prior to Admission medications   Medication Sig Start Date End Date Taking? Authorizing Provider  erythromycin ophthalmic ointment Place a 1/2 inch ribbon of ointment into the lower eyelid at bedtime x7 days 12/12/20  Yes Hazel Sams, PA-C  benzonatate (TESSALON) 100 MG capsule Take 1 capsule (100 mg total) by mouth 2 (two) times daily as needed. 10/03/20   Simmons-Robinson, Riki Sheer, MD  diclofenac Sodium (VOLTAREN) 1 % GEL Apply 4 g topically 4 (four) times daily. 11/21/20   Simmons-Robinson, Makiera, MD  famotidine (PEPCID) 20 MG tablet TAKE 1 TABLET BY MOUTH TWICE A DAY 09/07/20   Simmons-Robinson, Makiera, MD  Lidocaine (HM LIDOCAINE PATCH) 4 % PTCH Apply 1 each topically in the morning, at noon, in the evening, and at bedtime.  11/21/20   Simmons-Robinson, Makiera, MD  loratadine (CLARITIN) 10 MG tablet TAKE 1 TABLET BY MOUTH EVERY DAY 09/07/20   Simmons-Robinson, Riki Sheer, MD  losartan (COZAAR) 50 MG tablet Take 50 mg by mouth daily. 09/10/20   [provider]  metoprolol tartrate (LOPRESSOR) 25 MG tablet TAKE 1 TABLET BY MOUTH TWICE A DAY 10/31/20 01/29/21  Simmons-Robinson, Makiera, MD  montelukast (SINGULAIR) 10 MG tablet TAKE 1 TABLET BY MOUTH EVERYDAY AT BEDTIME 09/05/20   Simmons-Robinson, Makiera, MD  polyethylene glycol powder (GLYCOLAX/MIRALAX) 17 GM/SCOOP powder Take 17 g by mouth daily as needed. 06/11/20   Clarnce Flock, MD  trolamine salicylate (ASPER-FLEX) 10 % cream Apply 1 application topically as needed for muscle pain. 11/21/20   Simmons-Robinson, Makiera, MD  vitamin B-12  (CYANOCOBALAMIN) 1000 MCG tablet Take 1,000 mcg by mouth daily.    [provider]    Family History Family History  Problem Relation Age of Onset   Diabetes Mother    Healthy Father     Social History Social History   Tobacco Use   Smoking status: Never   Smokeless tobacco: Never  Vaping Use   Vaping Use: Never used  Substance Use Topics   Alcohol use: No   Drug use: No     Allergies   Patient has no known allergies.   Review of Systems Review of Systems  Eyes:  Positive for redness.  All other systems reviewed and are negative.   Physical Exam Triage Vital Signs ED Triage Vitals  Enc Vitals Group     BP 12/12/20 1800 120/89     Pulse Rate 12/12/20 1800 100     Resp 12/12/20 1800 18     Temp 12/12/20 1800 98.1 F (36.7 C)     Temp Source 12/12/20 1800 Oral     SpO2 12/12/20 1800 98 %     Weight --      Height --      Head Circumference --      Peak Flow --      Pain Score 12/12/20 1759 0     Pain Loc --      Pain Edu? --      Excl. in [place]? --    No data found.  Updated Vital Signs BP 120/89 (BP Location: Right Arm)   Pulse 100   Temp 98.1 F (36.7 C) (Oral)   Resp 18   SpO2 98%   Visual Acuity Right Eye Distance: 20/40 Left Eye Distance:   Bilateral Distance: 20/30  Right Eye Near:   Left Eye Near:  L Near: 20/30 Bilateral Near:     Physical Exam Vitals reviewed.  Constitutional:      General: She is not in acute distress.    Appearance: Normal appearance. She is not ill-appearing.  HENT:     Head: Normocephalic and atraumatic.     Right Ear: Hearing, tympanic membrane, ear canal and external ear normal. No tenderness. No middle ear effusion. There is no impacted cerumen. No mastoid tenderness. Tympanic membrane is not perforated, erythematous, retracted or bulging.     Left Ear: Hearing, tympanic membrane, ear canal and external ear normal. No tenderness.  No middle ear effusion. There is no impacted cerumen. No mastoid  tenderness. Tympanic membrane is not perforated, erythematous, retracted or bulging.     Mouth/Throat:     Pharynx: No posterior oropharyngeal erythema.  Eyes:     General: Lids are normal. Lids are everted, no foreign bodies appreciated. Vision  grossly intact. Gaze aligned appropriately. No visual field deficit.       Right eye: No foreign body, discharge or hordeolum.        Left eye: No foreign body, discharge or hordeolum.     Extraocular Movements: Extraocular movements intact.     Right eye: No nystagmus.     Left eye: No nystagmus.     Conjunctiva/sclera:     Right eye: Right conjunctiva is not injected. Hemorrhage present. No chemosis or exudate.    Left eye: Left conjunctiva is not injected. No chemosis, exudate or hemorrhage.    Pupils: Pupils are equal, round, and reactive to light. Pupils are equal.     Right eye: No corneal abrasion or fluorescein uptake. Seidel exam negative.     Left eye: No corneal abrasion or fluorescein uptake. Seidel exam negative.    Visual Fields: Right eye visual fields normal and left eye visual fields normal.      Comments: R subconjunctival hemorrhage medially. PERRLA. EOMI without pain. No proptosis. Anterior chamber, conjunctivae, sclera all without injury. Visual acuity intact.   Cardiovascular:     Rate and Rhythm: Normal rate and regular rhythm.     Heart sounds: Normal heart sounds.  Pulmonary:     Effort: Pulmonary effort is normal.     Breath sounds: Normal breath sounds and air entry.  Lymphadenopathy:     Cervical: No cervical adenopathy.  Neurological:     General: No focal deficit present.     Mental Status: She is alert and oriented to person, place, and time.  Psychiatric:        Attention and Perception: Attention and perception normal.        Mood and Affect: Mood and affect normal.     UC Treatments / Results  Labs (all labs ordered are listed, but only abnormal results are displayed) Labs Reviewed - No data to  display  EKG   Radiology No results found.  Procedures Procedures (including critical care time)  Medications Ordered in UC Medications - No data to display  Initial Impression / Assessment and Plan / UC Course  I have reviewed the triage vital signs and the nursing notes.  Pertinent labs & imaging results that were available during my care of the patient were reviewed by me and considered in my medical decision making (see chart for details).     This patient is a very pleasant 40 y.o. year old female presenting with R subconjunctival hemorrhage. Visual acuity intact. Does not wear contacts. Given bilateral eye itching, I am amenable to initiating erythromycin ointment, as she has failed treatment with OTC drops. ED return precautions discussed. Patient verbalizes understanding and agreement.  .   Final Clinical Impressions(s) / UC Diagnoses   Final diagnoses:  Subconjunctival hemorrhage of right eye     Discharge Instructions      -You have a small burst blood vessel in your right eye.  This is not a problem, and usually gets better on its own, but because your eyes are also very itchy I am worried that you are getting an infection. -We are treating it with an antibiotic ointment called erythromycin.  Use this once nightly for about 7 days.  Pull down the lower eyelid, and place about half an inch inside.  This will be messy, so press the remaining ointment around the eye.  You can wash your face with gentle soap and water in the morning to wash off any remaining ointment. -  Warm compresses -Seek additional medical attention if symptoms get worse, like eye pain, eye lid swelling, vision changes. Follow-up with your eye doctor if possible, but we're also happy to see you!    ED Prescriptions     Medication Sig Dispense Auth. Provider   erythromycin ophthalmic ointment Place a 1/2 inch ribbon of ointment into the lower eyelid at bedtime x7 days 3.5 g Hazel Sams, PA-C       PDMP not reviewed this encounter.   Hazel Sams, PA-C 12/12/20 [facility]

## 2020-12-12 NOTE — ED Triage Notes (Signed)
Pt presents withy c/o redness and irritation on the R eye. Pt states it started yesterday night and c/o it moving to the L eye. Pt states she has tried eye drops.

## 2020-12-16 ENCOUNTER — Other Ambulatory Visit: Payer: Self-pay | Admitting: Physician Assistant

## 2020-12-16 DIAGNOSIS — D72829 Elevated white blood cell count, unspecified: Secondary | ICD-10-CM

## 2020-12-17 ENCOUNTER — Inpatient Hospital Stay: Payer: Medicaid Other | Attending: Hematology and Oncology

## 2020-12-17 ENCOUNTER — Inpatient Hospital Stay: Payer: Medicaid Other | Admitting: Physician Assistant

## 2020-12-22 ENCOUNTER — Ambulatory Visit: Payer: Medicaid Other | Admitting: Hematology and Oncology

## 2020-12-24 DIAGNOSIS — R053 Chronic cough: Secondary | ICD-10-CM | POA: Diagnosis not present

## 2020-12-24 DIAGNOSIS — I129 Hypertensive chronic kidney disease with stage 1 through stage 4 chronic kidney disease, or unspecified chronic kidney disease: Secondary | ICD-10-CM | POA: Diagnosis not present

## 2020-12-24 DIAGNOSIS — N184 Chronic kidney disease, stage 4 (severe): Secondary | ICD-10-CM | POA: Diagnosis not present

## 2020-12-24 DIAGNOSIS — R252 Cramp and spasm: Secondary | ICD-10-CM | POA: Diagnosis not present

## 2020-12-25 ENCOUNTER — Other Ambulatory Visit: Payer: Self-pay | Admitting: Family Medicine

## 2020-12-25 DIAGNOSIS — R053 Chronic cough: Secondary | ICD-10-CM

## 2021-01-07 NOTE — Progress Notes (Signed)
Office Visit Note  Patient: Zoe Woodard             Date of Birth: 1980-03-26           MRN: [id number]             PCP: Eulis Foster, MD Referring: Lincoln Brigham, PA-C Visit Date: 01/20/2021 Occupation: '@GUAROCC' @  Subjective:  Pain in joints   History of Present Illness: Zoe Woodard is a 40 y.o. female seen in consultation per request of her hematologist for joint pain.  According the patient at age 48 while she had a physical she was found to have elevated creatinine.  She was referred to a nephrologist while she was living in [place].  She states her labs showed low GFR and she was told that she had chronic kidney disease stage I and II.  She states she was followed by nephrologist every 2 months and then she was discharged her kidney function improved.  Last year she had a bad headache and she went to the emergency room.  She was found to have systolic blood pressure of 240 per patient.  She was also found to have very high creatinine and white cell count.  She was referred to Dr. Moshe Cipro and had been followed by Dr. Moshe Cipro closely.  She states that she did not have renal biopsy and no diagnosis was established.  Dr. Moshe Cipro has been following her closely.  She was also evaluated by a cardiologist in September 2022 due to shortness of breath and chest pain.  The work-up was negative and dietary modifications were advised.  She was referred to hematologist for elevated WBC count.  Her work-up has been negative so far.  She was diagnosed with B12 deficiency and she has been taking B12.  She states for the last 5 months she has been experiencing pain and discomfort in her lower back, bilateral elbows and her bilateral knee joints.  She has not seen any joint swelling.  There is no history of oral ulcers, nasal ulcers, sicca symptoms, Raynaud's phenomenon, photosensitivity, rash or lymphadenopathy.  There is no history of inflammatory arthritis.  She  continues to have pelvic pain.  She has been evaluated by GYN.  He is gravida 3, para 3, miscarriages 0.  There is no family history of autoimmune disease.  Activities of Daily Living:  Patient reports morning stiffness for several hours.   Patient Reports nocturnal pain.  Difficulty dressing/grooming: Denies Difficulty climbing stairs: Denies Difficulty getting out of chair: Denies Difficulty using hands for taps, buttons, cutlery, and/or writing: Denies  Review of Systems  Constitutional:  Positive for fatigue.  HENT:  Negative for mouth sores, mouth dryness and nose dryness.   Eyes:  Negative for pain, itching and dryness.  Respiratory:  Positive for shortness of breath. Negative for difficulty breathing.   Cardiovascular:  Negative for chest pain and palpitations.  Gastrointestinal:  Positive for constipation. Negative for blood in stool and diarrhea.  Endocrine: Positive for excessive thirst. Negative for increased urination.  Genitourinary:  Positive for pelvic pain. Negative for difficulty urinating.  Musculoskeletal:  Positive for joint pain, joint pain, myalgias, morning stiffness, muscle tenderness and myalgias. Negative for joint swelling.  Skin:  Negative for color change, rash, redness and sensitivity to sunlight.  Allergic/Immunologic: Negative for susceptible to infections.  Neurological:  Positive for headaches. Negative for dizziness, numbness, memory loss and weakness.  Hematological:  Negative for bruising/bleeding tendency and swollen glands.  Psychiatric/Behavioral:  Negative for  depressed mood, confusion and sleep disturbance. The patient is not nervous/anxious.    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   CKD (chronic kidney disease) stage 4, GFR 15-29 ml/min ([place]) 10/10/2019   Hyperlipidemia 08/22/2019   Lung nodule 08/22/2019   Breast mass 08/22/2019   Chronic cough 08/05/2019   Essential hypertension 07/22/2019   Healthcare maintenance 08/04/2016    Past Medical History:  Diagnosis Date   Abdominal pain 12/28/2019   Back pain 10/10/2019   Chronic kidney disease    Ear pain, bilateral 04/15/2020   Hyperlipidemia    Hypertensive urgency 07/22/2019   Hypocalcemia 08/22/2019   Low blood pressure 02/07/2020   Obesity    Pyelonephritis 11/08/2019    Family History  Problem Relation Age of Onset   Diabetes Mother    Healthy Son    Healthy Daughter    Healthy Daughter    Past Surgical History:  Procedure Laterality Date   ABDOMINAL HYSTERECTOMY     APPENDECTOMY     bladder laceration  01/04/2015   excision of thyroglobal duct cyst  02/01/2012   right ovarian cystoectomy  01/02/2015   right ovarian egstectomy  04/27/2013   SPINE SURGERY     TONSILLECTOMY     TUBAL LIGATION     Social History   Social History Narrative   Lives with 3 children    Not working currently   Not sexually active.    Immunization History  Administered Date(s) Administered   Influenza,inj,Quad PF,6+ Mos 10/06/2017, 10/06/2018, 09/25/2019, 10/27/2020   Pneumococcal Conjugate-13 08/11/2019   Tdap 10/27/2020     Objective: Vital Signs: BP 117/87 (BP Location: Right Arm, Patient Position: Sitting, Cuff Size: Normal)    Pulse 70    Ht 5' 3.5" (1.613 m)    Wt 189 lb (85.7 kg)    BMI 32.95 kg/m    Physical Exam Vitals and nursing note reviewed.  Constitutional:      Appearance: She is well-developed.  HENT:     Head: Normocephalic and atraumatic.  Eyes:     Conjunctiva/sclera: Conjunctivae normal.  Cardiovascular:     Rate and Rhythm: Normal rate  and regular rhythm.     Heart sounds: Normal heart sounds.  Pulmonary:     Effort: Pulmonary effort is normal.     Breath sounds: Normal breath sounds.  Abdominal:     General: Bowel sounds are normal.     Palpations: Abdomen is soft.  Musculoskeletal:     Cervical back: Normal range of motion.  Lymphadenopathy:     Cervical: No cervical adenopathy.  Skin:    General: Skin is warm and dry.     Capillary Refill: Capillary refill takes less than 2 seconds.  Neurological:     Mental Status: She is alert and oriented to person, place, and time.  Psychiatric:        Behavior: Behavior normal.     Musculoskeletal Exam: C-spine was in good range of motion.  Thoracic and lumbar spine were in good range of motion.  She had no point tenderness.  Shoulder joints, elbow joints, wrist  joints, MCPs PIPs and DIPs with good range of motion.  She had tenderness on palpation over bilateral lateral epicondyle region.  Hip joints, knee joints, ankles, MTPs and PIPs with good range of motion with no synovitis.  CDAI Exam: CDAI Score: -- Patient Global: --; Provider Global: -- Swollen: --; Tender: -- Joint Exam 01/20/2021   No joint exam has been documented for this visit   There is currently no information documented on the homunculus. Go to the Rheumatology activity and complete the homunculus joint exam.  Investigation: No additional findings.  Imaging: No results found.  Recent Labs: Lab Results  Component Value Date   WBC 17.3 (H) 11/19/2020   HGB 11.5 (L) 11/19/2020   PLT 397 11/19/2020   NA 138 11/19/2020   K 3.9 11/19/2020   CL 107 11/19/2020   CO2 20 (L) 11/19/2020   GLUCOSE 92 11/19/2020   BUN 27 (H) 11/19/2020   CREATININE 2.24 (H) 11/19/2020   BILITOT 0.3 11/19/2020   ALKPHOS 148 (H) 11/19/2020   AST 23 11/19/2020   ALT 30 11/19/2020   PROT 7.0 11/19/2020   ALBUMIN 3.2 (L) 11/19/2020   CALCIUM 8.2 (L) 11/19/2020   GFRAA 31 (L) 02/07/2020    Speciality Comments: No  specialty comments available.  Procedures:  No procedures performed Allergies: Patient has no known allergies.   Assessment / Plan:     Visit Diagnoses: Bilateral elbow joint pain-she complains of pain and discomfort in her bilateral elbows.  No synovitis was noted.  I also reviewed her x-rays from October 28, 2020.  She had previous fracture in her left elbow.  She had tenderness over bilateral lateral epicondyle region.  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  A handout on forearm exercises was given.  Bilateral chronic knee pain -she complains of pain and discomfort in the bilateral knee joints.  No warmth swelling or effusion was noted.  X-rays obtained on October 27, 2020 were reviewed which were unremarkable except for patellar spurring.  A handout on lower extremity muscle strengthening exercises was given.  I will also obtain autoimmune labs today.  Plan: Rheumatoid factor, ANA, Cyclic citrul peptide antibody, IgG, Angiotensin converting enzyme  History of leukocytosis - 11/19/20: WBC count 17.3, absolute neutrophils 11.1, absolute lymphocytes 4.9.  She is followed by hematology.  Elevated sed rate - 11/19/20: ESR 56, CRP 4.3.  Followed by hematology.  Elevated C-reactive protein (CRP)  CKD (chronic kidney disease) stage 4, GFR 15-29 ml/min ([facility])-followed by Dr. Moshe Cipro.  Essential hypertension-patient states she had chronic undiagnosed hypertension.  Her blood pressure was well controlled today.  Hypertriglyceridemia-dietary modifications were discussed.  Myalgia due to statin-she denies any muscle pain today.  Adnexal cyst-she continues to have pelvic pain.  She has been followed by GYN.  Orders: Orders Placed This Encounter  Procedures   Rheumatoid factor   ANA   Cyclic citrul peptide antibody, IgG   Angiotensin converting enzyme   No orders of the defined types were placed in this  encounter.   Face-to-face time spent with patient was 45 minutes. Greater than 50% of time was spent in counseling and coordination of care.  Follow-Up Instructions: Return for Pain in joints.   Bo Merino, MD  Note - This record has been created using Editor, commissioning.  Chart creation errors have been sought, but may not always  have been located. Such creation errors do not reflect on  the standard of medical care.

## 2021-01-12 ENCOUNTER — Other Ambulatory Visit: Payer: Self-pay

## 2021-01-12 ENCOUNTER — Ambulatory Visit (HOSPITAL_COMMUNITY)
Admission: EM | Admit: 2021-01-12 | Discharge: 2021-01-12 | Disposition: A | Discharge status: Home / Self Care | Payer: Medicaid Other | Attending: Internal Medicine | Admitting: Internal Medicine

## 2021-01-12 DIAGNOSIS — M545 Low back pain, unspecified: Secondary | ICD-10-CM | POA: Diagnosis not present

## 2021-01-12 DIAGNOSIS — N3001 Acute cystitis with hematuria: Secondary | ICD-10-CM | POA: Diagnosis not present

## 2021-01-12 LAB — POCT URINALYSIS DIPSTICK, ED / UC
Bilirubin Urine: NEGATIVE
Glucose, UA: NEGATIVE mg/dL
Ketones, ur: NEGATIVE mg/dL
Nitrite: NEGATIVE
Protein, ur: 100 mg/dL — AB
Specific Gravity, Urine: 1.015 (ref 1.005–1.030)
Urobilinogen, UA: 0.2 mg/dL (ref 0.0–1.0)
pH: 5.5 (ref 5.0–8.0)

## 2021-01-12 MED ORDER — TRAMADOL HCL 50 MG PO TABS: 50.0000 mg | ORAL_TABLET | Freq: Two times a day (BID) | ORAL | 0 refills | Status: DC | PRN

## 2021-01-12 MED ORDER — CEPHALEXIN 500 MG PO CAPS: 500.0000 mg | ORAL_CAPSULE | Freq: Two times a day (BID) | ORAL | 0 refills | Status: AC

## 2021-01-12 NOTE — ED Triage Notes (Signed)
Pt presents to [facility] for lower abdominal and back pain. She does report some dysuria that started a couple of days ago.

## 2021-01-12 NOTE — ED Provider Notes (Signed)
[facility]    CSN: [id number] Arrival date & time: 01/12/21  1249      History   Chief Complaint Chief Complaint  Patient presents with   Back Pain   Abdominal Pain    Dysuria    HPI Zoe Woodard is a 41 y.o. female comes to the urgent care with a few days history of lower back pain.  Patient is describes the pain as throbbing and aching in nature.  Pain is aggravated by movement.  No known relieving factors.  No fever or chills.  No nausea or vomiting.  Patient denies any falls, trauma or heavy lifting.  Last night patient started experiencing some pain on urination, urinary frequency and urgency.  She also complains of lower abdominal pain.  No nausea or vomiting.  No history of kidney stones. HPI  Past Medical History:  Diagnosis Date   Abdominal pain 12/28/2019   Back pain 10/10/2019   Chronic kidney disease    Ear pain, bilateral 04/15/2020   Hyperlipidemia    Hypertensive urgency 07/22/2019   Hypocalcemia 08/22/2019   Low blood pressure 02/07/2020   Obesity    Pyelonephritis 11/08/2019    Patient Active Problem List   Diagnosis Date Noted   Bilateral chronic knee pain 10/31/2020   Bilateral elbow joint pain 10/31/2020   Hypertriglyceridemia 08/09/2020   Myalgia due to statin 08/06/2020   Bilateral headaches 07/08/2020   Adnexal cyst 04/15/2020   Atypical chest pain 03/13/2020   COVID-19 02/07/2020   Lower abdominal pain 12/28/2019   Leukocytosis 12/28/2019   Guyon syndrome, 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   CKD (chronic kidney disease) stage 4, GFR 15-29 ml/min ([place]) 10/10/2019   Hyperlipidemia 08/22/2019   Lung nodule 08/22/2019   Breast mass 08/22/2019   Chronic cough 08/05/2019   Essential hypertension 07/22/2019   Healthcare maintenance 08/04/2016    Past Surgical History:  Procedure Laterality Date   ABDOMINAL HYSTERECTOMY     APPENDECTOMY     bladder laceration  01/04/2015   excision of thyroglobal duct cyst  02/01/2012   right  ovarian cystoectomy  01/02/2015   right ovarian egstectomy  04/27/2013   SPINE SURGERY     TONSILLECTOMY     TUBAL LIGATION      OB History     Gravida  3   Para      Term      Preterm      AB      Living  3      SAB      IAB      Ectopic      Multiple      Live Births               Home Medications    Prior to Admission medications   Medication Sig Start Date End Date Taking? Authorizing Provider  cephALEXin (KEFLEX) 500 MG capsule Take 1 capsule (500 mg total) by mouth 2 (two) times daily for 5 days. 01/12/21 01/17/21 Yes Zahlia Deshazer, Myrene Galas, MD  traMADol (ULTRAM) 50 MG tablet Take 1 tablet (50 mg total) by mouth every 12 (twelve) hours as needed for severe pain. 01/12/21  Yes Kamron Vanwyhe, Myrene Galas, MD  benzonatate (TESSALON) 100 MG capsule Take 1 capsule (100 mg total) by mouth 2 (two) times daily as needed. 10/03/20   Simmons-Robinson, Riki Sheer, MD  diclofenac Sodium (VOLTAREN) 1 % GEL Apply 4 g topically 4 (four) times daily. 11/21/20   Simmons-Robinson, Riki Sheer, MD  erythromycin ophthalmic ointment Place  a 1/2 inch ribbon of ointment into the lower eyelid at bedtime x7 days 12/12/20   Hazel Sams, PA-C  famotidine (PEPCID) 20 MG tablet TAKE 1 TABLET BY MOUTH TWICE A DAY 12/25/20   Simmons-Robinson, Makiera, MD  Lidocaine (HM LIDOCAINE PATCH) 4 % PTCH Apply 1 each topically in the morning, at noon, in the evening, and at bedtime. 11/21/20   Simmons-Robinson, Makiera, MD  loratadine (CLARITIN) 10 MG tablet TAKE 1 TABLET BY MOUTH EVERY DAY 09/07/20   Simmons-Robinson, Riki Sheer, MD  losartan (COZAAR) 50 MG tablet Take 50 mg by mouth daily. 09/10/20   [provider]  metoprolol tartrate (LOPRESSOR) 25 MG tablet TAKE 1 TABLET BY MOUTH TWICE A DAY 10/31/20 01/29/21  Simmons-Robinson, Makiera, MD  montelukast (SINGULAIR) 10 MG tablet TAKE 1 TABLET BY MOUTH EVERYDAY AT BEDTIME 09/05/20   Simmons-Robinson, Makiera, MD  polyethylene glycol powder (GLYCOLAX/MIRALAX) 17  GM/SCOOP powder Take 17 g by mouth daily as needed. 06/11/20   Clarnce Flock, MD  trolamine salicylate (ASPER-FLEX) 10 % cream Apply 1 application topically as needed for muscle pain. 11/21/20   Simmons-Robinson, Makiera, MD  vitamin B-12 (CYANOCOBALAMIN) 1000 MCG tablet Take 1,000 mcg by mouth daily.    [provider]    Family History Family History  Problem Relation Age of Onset   Diabetes Mother    Healthy Father     Social History Social History   Tobacco Use   Smoking status: Never   Smokeless tobacco: Never  Vaping Use   Vaping Use: Never used  Substance Use Topics   Alcohol use: No   Drug use: No     Allergies   Patient has no known allergies.   Review of Systems Review of Systems As per HPI  Physical Exam Triage Vital Signs ED Triage Vitals [01/12/21 1529]  Enc Vitals Group     BP 134/86     Pulse Rate 78     Resp 16     Temp 98.6 F (37 C)     Temp src      SpO2 100 %     Weight      Height      Head Circumference      Peak Flow      Pain Score      Pain Loc      Pain Edu?      Excl. in [place]?    No data found.  Updated Vital Signs BP 134/86 (BP Location: Left Arm)    Pulse 78    Temp 98.6 F (37 C)    Resp 16    SpO2 100%   Visual Acuity Right Eye Distance:   Left Eye Distance:   Bilateral Distance:    Right Eye Near:   Left Eye Near:    Bilateral Near:     Physical Exam Vitals and nursing note reviewed.  Constitutional:      General: She is not in acute distress.    Appearance: She is not ill-appearing.  Cardiovascular:     Rate and Rhythm: Normal rate and regular rhythm.  Pulmonary:     Effort: Pulmonary effort is normal.     Breath sounds: Normal breath sounds.  Abdominal:     General: Abdomen is flat. Bowel sounds are normal.     Palpations: Abdomen is soft. There is no shifting dullness, hepatomegaly or splenomegaly.     Tenderness: There is abdominal tenderness in the suprapubic area. There is no guarding or  rebound. Negative signs include McBurney's sign.     Hernia: No hernia is present.  Skin:    General: Skin is warm.     Comments: Tenderness on palpation over the sacroiliac joints bilaterally.  Pain is localized.  No bruising noted.  Full range of motion of the lumbar spine.  Neurological:     Mental Status: She is alert.     UC Treatments / Results  Labs (all labs ordered are listed, but only abnormal results are displayed) Labs Reviewed  POCT URINALYSIS DIPSTICK, ED / UC - Abnormal; Notable for the following components:      Result Value   Hgb urine dipstick MODERATE (*)    Protein, ur 100 (*)    Leukocytes,Ua SMALL (*)    All other components within normal limits  URINE CULTURE    EKG   Radiology No results found.  Procedures Procedures (including critical care time)  Medications Ordered in UC Medications - No data to display  Initial Impression / Assessment and Plan / UC Course  I have reviewed the triage vital signs and the nursing notes.  Pertinent labs & imaging results that were available during my care of the patient were reviewed by me and considered in my medical decision making (see chart for details).     1.  Acute cystitis with hematuria: Keflex 500 mg twice daily for 5 days Point-of-care urinalysis is positive for hemoglobin and leukocyte Estrace Urine cultures have been sent Patient is encouraged to increase oral fluid intake Tramadol as needed for pain Patient has chronic kidney disease hence cannot tolerate NSAIDs.  2.  Acute left-sided back pain without sciatica: Pain medications as above Final Clinical Impressions(s) / UC Diagnoses   Final diagnoses:  Acute cystitis with hematuria  Acute left-sided low back pain without sciatica     Discharge Instructions      Increase oral fluid intake Take medications as prescribed We will call you with recommendations if labs are abnormal If you have worsening symptoms please return to urgent  care to be reevaluated     ED Prescriptions     Medication Sig Dispense Auth. Provider   cephALEXin (KEFLEX) 500 MG capsule Take 1 capsule (500 mg total) by mouth 2 (two) times daily for 5 days. 10 capsule Freyja Govea, Myrene Galas, MD   traMADol (ULTRAM) 50 MG tablet Take 1 tablet (50 mg total) by mouth every 12 (twelve) hours as needed for severe pain. 15 tablet Ronya Gilcrest, Myrene Galas, MD      I have reviewed the PDMP during this encounter.   Chase Picket, MD 01/14/21 [id number]

## 2021-01-12 NOTE — Discharge Instructions (Addendum)
Increase oral fluid intake Take medications as prescribed We will call you with recommendations if labs are abnormal If you have worsening symptoms please return to urgent care to be reevaluated

## 2021-01-15 LAB — URINE CULTURE: Culture: 30000 — AB

## 2021-01-20 ENCOUNTER — Other Ambulatory Visit: Payer: Self-pay

## 2021-01-20 ENCOUNTER — Ambulatory Visit (INDEPENDENT_AMBULATORY_CARE_PROVIDER_SITE_OTHER): Payer: Medicaid Other | Admitting: Rheumatology

## 2021-01-20 ENCOUNTER — Encounter: Payer: Self-pay | Admitting: Rheumatology

## 2021-01-20 VITALS — BP 117/87 | HR 70 | Ht 63.5 in | Wt 189.0 lb

## 2021-01-20 DIAGNOSIS — R7982 Elevated C-reactive protein (CRP): Secondary | ICD-10-CM | POA: Diagnosis not present

## 2021-01-20 DIAGNOSIS — Z862 Personal history of diseases of the blood and blood-forming organs and certain disorders involving the immune mechanism: Secondary | ICD-10-CM | POA: Diagnosis not present

## 2021-01-20 DIAGNOSIS — E781 Pure hyperglyceridemia: Secondary | ICD-10-CM | POA: Diagnosis not present

## 2021-01-20 DIAGNOSIS — N184 Chronic kidney disease, stage 4 (severe): Secondary | ICD-10-CM | POA: Diagnosis not present

## 2021-01-20 DIAGNOSIS — M7711 Lateral epicondylitis, right elbow: Secondary | ICD-10-CM

## 2021-01-20 DIAGNOSIS — M25561 Pain in right knee: Secondary | ICD-10-CM

## 2021-01-20 DIAGNOSIS — I10 Essential (primary) hypertension: Secondary | ICD-10-CM | POA: Diagnosis not present

## 2021-01-20 DIAGNOSIS — R700 Elevated erythrocyte sedimentation rate: Secondary | ICD-10-CM | POA: Diagnosis not present

## 2021-01-20 DIAGNOSIS — M25562 Pain in left knee: Secondary | ICD-10-CM | POA: Diagnosis not present

## 2021-01-20 DIAGNOSIS — N949 Unspecified condition associated with female genital organs and menstrual cycle: Secondary | ICD-10-CM | POA: Diagnosis not present

## 2021-01-20 DIAGNOSIS — T466X5A Adverse effect of antihyperlipidemic and antiarteriosclerotic drugs, initial encounter: Secondary | ICD-10-CM

## 2021-01-20 DIAGNOSIS — M25522 Pain in left elbow: Secondary | ICD-10-CM | POA: Diagnosis not present

## 2021-01-20 DIAGNOSIS — M7712 Lateral epicondylitis, left elbow: Secondary | ICD-10-CM

## 2021-01-20 DIAGNOSIS — M791 Myalgia, unspecified site: Secondary | ICD-10-CM | POA: Diagnosis not present

## 2021-01-20 DIAGNOSIS — M25521 Pain in right elbow: Secondary | ICD-10-CM

## 2021-01-20 DIAGNOSIS — G8929 Other chronic pain: Secondary | ICD-10-CM | POA: Diagnosis not present

## 2021-01-20 NOTE — Patient Instructions (Signed)
Elbow and Forearm Exercises Ask your health care provider which exercises are safe for you. Do exercises exactly as told by your health care provider and adjust them as directed. It is normal to feel mild stretching, pulling, tightness, or discomfort as you do these exercises. Stop right away if you feel sudden pain or your pain gets worse. Do not begin these exercises until told by your health care provider. Range-of-motion exercises These exercises warm up your muscles and joints and improve the movement and flexibility of your injured elbow and forearm. The exercises also help to relieve pain, numbness, and tingling. These exercises are done using the muscles in your injured elbow and forearm (active). Elbow flexion, active Hold your left / right arm at your side, and bend your elbow (flexion) as far as you can using only your arm muscles. Hold this position for __________ seconds. Slowly return to the starting position. Repeat __________ times. Complete this exercise __________ times a day. Elbow extension, active Hold your left / right arm at your side, and straighten your elbow (extension) as much as you can using only your arm muscles. Hold this position for __________ seconds. Slowly return to the starting position. Repeat __________ times. Complete this exercise __________ times a day. Active forearm rotation, supination This is an exercise in which you turn (rotate) your forearm palm up (supination). Stand or sit with your elbows at your sides. Bend your left / right elbow to a 90-degree angle (right angle). Rotate your palm up until you feel a gentle stretch on the inside of your forearm. Hold this position for __________ seconds. Slowly return to the starting position. Repeat __________ times. Complete this exercise __________ times a day. Active forearm rotation, pronation This is an exercise in which you turn (rotate) your forearm palm down (pronation). Stand or sit with your  elbows at your sides. Bend your left / right elbow to a 90-degree angle (right angle). Rotate your palm down until you feel a gentle stretch on the top of your forearm. Hold this position for __________ seconds. Slowly return to the starting position. Repeat __________ times. Complete this exercise __________ times a day. Stretching exercises These exercises warm up your muscles and joints and improve the movement and flexibility of your injured elbow and forearm. These exercises also help to relieve pain, numbness, and tingling. These exercises are done using your healthy elbow and forearm to help stretch the muscles in your injured elbow and forearm (active-assisted). Elbow flexion, active-assisted  Hold your left / right arm at your side, and bend your elbow (flexion) as much as you can using your left / right arm muscles. Use your other hand to bend your left / right elbow farther. To do this, gently push up on your forearm until you feel a gentle stretch on the back of your elbow. Hold this position for __________ seconds. Slowly return to the starting position. Repeat __________ times. Complete this exercise __________ times a day. Elbow extension, active-assisted  Hold your left / right arm at your side, and straighten your elbow (extension) as much as you can using your left / right arm muscles. Use your other hand to straighten the left / right elbow farther. To do this, gently push down on your forearm until you feel a gentle stretch on the inside of your elbow. Hold this position for __________ seconds. Slowly return to the starting position. Repeat __________ times. Complete this exercise __________ times a day. Active-assisted forearm rotation, supination This is  an exercise in which you turn (rotate) your forearm palm up (supination). Sit with your left / right elbow bent in a 90-degree angle (right angle) with your forearm resting on a table. Keeping your upper body and  shoulder still, rotate your forearm so your palm faces upward. Use your other hand to help rotate your forearm further until you feel a gentle to moderate stretch. Hold this position for __________ seconds. Slowly release the stretch and return to the starting position. Repeat __________ times. Complete this exercise __________ times a day. Active-assisted forearm rotation, pronation This is an exercise in which you turn (rotate) your forearm palm down (pronation). Sit with your left / right elbow bent in a 90-degree angle (right angle) with your forearm resting on a table. Keeping your upper body and shoulder still, rotate your forearm so your palm faces the tabletop. Use your other hand to help rotate your forearm further until you feel a gentle to moderate stretch. Hold this position for __________ seconds. Slowly release the stretch and return to the starting position. Repeat __________ times. Complete this exercise __________ times a day. Passive elbow flexion, supine Lie on your back (supine position). Extend your left / right arm up in the air, bracing it with your other hand. Let your left / right hand slowly lower toward your shoulder (passive flexion), while your elbow stays pointed toward the ceiling. You should feel a gentle stretch along the back of your upper arm and elbow. If instructed by your health care provider, you may increase the intensity of your stretch by adding a small wrist weight or hand weight. Hold this position for __________ seconds. Slowly return to the starting position. Repeat __________ times. Complete this exercise __________ times a day. Passive elbow extension, supine  Lie on your back (supine position). Make sure that you are in a comfortable position that lets you relax your arm muscles. Place a folded towel under your left / right upper arm so your elbow and shoulder are at the same height. Straighten your left / right arm so your elbow does not rest  on the bed or towel. Let the weight of your hand stretch your elbow (passive extension). Keep your arm and chest muscles relaxed. You should feel a stretch on the inside of your elbow. If told by your health care provider, you may increase the intensity of your stretch by adding a small wrist weight or hand weight. Hold this position for __________ seconds. Slowly release the stretch. Repeat __________ times. Complete this exercise __________ times a day. Strengthening exercises These exercises build strength and endurance in your elbow and forearm. Endurance is the ability to use your muscles for a long time, even after they get tired. Elbow flexion, isometric  Stand or sit up straight. Bend your left / right elbow in a 90-degree angle (right angle), and keep your forearm at the height of your waist. Your thumb should be pointed toward the ceiling (neutral forearm). Place your other hand on top of your left / right forearm. Gently push down while you resist with your left / right arm (isometric flexion). Push as hard as you can with both arms without causing any pain or movement at your left / right elbow. Hold this position for __________ seconds. Slowly release the tension in both arms. Let your muscles relax completely before you repeat the exercise. Repeat __________ times. Complete this exercise __________ times a day. Elbow extension, isometric  Stand or sit up straight. Place  your left / right arm so your palm faces your abdomen and is at the height of your waist. Place your other hand on the underside of your left / right forearm. Gently push up while you resist with your left / right arm (isometric extension). Push as hard as you can with both arms without causing any pain or movement at your left / right elbow. Hold this position for __________ seconds. Slowly release the tension in both arms. Let your muscles relax completely before you repeat the exercise. Repeat __________ times.  Complete this exercise __________ times a day. Elbow flexion with forearm palm up  Sit on a firm chair without armrests, or stand up. Place your left / right arm at your side with your elbow straight and your palm facing forward. Holding a __________weight or gripping a rubber exercise band or tubing, bend your elbow to bring your hand toward your shoulder (flexion). Hold this position for __________ seconds. Slowly return to the starting position. Repeat __________ times. Complete this exercise __________ times a day. Elbow extension, active  Sit on a firm chair without armrests, or stand up. Hold a rubber exercise band or tubing in both hands. Keeping your upper arms at your sides, bring both hands up to your left / right shoulder. Keep your left / right hand just below your other hand. Straighten your left / right elbow (extension) while keeping your other arm still. Hold this position for __________ seconds. Control the resistance of the band or tubing as you return to the starting position. Repeat __________ times. Complete this exercise __________ times a day. Forearm rotation, supination  Sit with your left / right forearm supported on a table. Your elbow should be at waist height and bent at a 90-degree angle (right angle). Gently grasp a lightweight hammer. Rest your hand over the edge of the table with your palm facing down. Without moving your left / right elbow, slowly rotate your forearm to turn your palm up toward the ceiling (supination). Hold this position for __________ seconds. Slowly return to the starting position. Repeat __________ times. Complete this exercise __________ times a day. Forearm rotation, pronation  Sit with your left / right forearm supported on a table. Keep your elbow below shoulder height. Gently grasp a lightweight hammer. Rest your hand over the edge of the table with your palm facing up. Without moving your left / right elbow, slowly rotate  your forearm to turn your palm down toward the floor (pronation). Hold this position for __________seconds. Slowly return to the starting position. Repeat __________ times. Complete this exercise __________ times a day. This information is not intended to replace advice given to you by your health care provider. Make sure you discuss any questions you have with your health care provider. Document Revised: 04/20/2018 Document Reviewed: 01/18/2018 [employer] Patient Education  [place]. Knee Exercises Ask your health care provider which exercises are safe for you. Do exercises exactly as told by your health care provider and adjust them as directed. It is normal to feel mild stretching, pulling, tightness, or discomfort as you do these exercises. Stop right away if you feel sudden pain or your pain gets worse. Do not begin these exercises until told by your health care provider. Stretching and range-of-motion exercises These exercises warm up your muscles and joints and improve the movement and flexibility of your knee. These exercises also help to relieve pain and swelling. Knee extension, prone  Lie on your abdomen (prone position)  on a bed. Place your left / right knee just beyond the edge of the surface so your knee is not on the bed. You can put a towel under your left / right thigh just above your kneecap for comfort. Relax your leg muscles and allow gravity to straighten your knee (extension). You should feel a stretch behind your left / right knee. Hold this position for __________ seconds. Scoot up so your knee is supported between repetitions. Repeat __________ times. Complete this exercise __________ times a day. Knee flexion, active  Lie on your back with both legs straight. If this causes back discomfort, bend your left / right knee so your foot is flat on the floor. Slowly slide your left / right heel back toward your buttocks. Stop when you feel a gentle stretch in the  front of your knee or thigh (flexion). Hold this position for __________ seconds. Slowly slide your left / right heel back to the starting position. Repeat __________ times. Complete this exercise __________ times a day. Quadriceps stretch, prone  Lie on your abdomen on a firm surface, such as a bed or padded floor. Bend your left / right knee and hold your ankle. If you cannot reach your ankle or pant leg, loop a belt around your foot and grab the belt instead. Gently pull your heel toward your buttocks. Your knee should not slide out to the side. You should feel a stretch in the front of your thigh and knee (quadriceps). Hold this position for __________ seconds. Repeat __________ times. Complete this exercise __________ times a day. Hamstring, supine  Lie on your back (supine position). Loop a belt or towel over the ball of your left / right foot. The ball of your foot is on the walking surface, right under your toes. Straighten your left / right knee and slowly pull on the belt to raise your leg until you feel a gentle stretch behind your knee (hamstring). Do not let your knee bend while you do this. Keep your other leg flat on the floor. Hold this position for __________ seconds. Repeat __________ times. Complete this exercise __________ times a day. Strengthening exercises These exercises build strength and endurance in your knee. Endurance is the ability to use your muscles for a long time, even after they get tired. Quadriceps, isometric This exercise strengthens the muscles in front of your thigh (quadriceps) without moving your knee joint (isometric). Lie on your back with your left / right leg extended and your other knee bent. Put a rolled towel or small pillow under your knee if told by your health care provider. Slowly tense the muscles in the front of your left / right thigh. You should see your kneecap slide up toward your hip or see increased dimpling just above the knee.  This motion will push the back of the knee toward the floor. For __________ seconds, hold the muscle as tight as you can without increasing your pain. Relax the muscles slowly and completely. Repeat __________ times. Complete this exercise __________ times a day. Straight leg raises This exercise strengthens the muscles in front of your thigh (quadriceps) and the muscles that move your hips (hip flexors). Lie on your back with your left / right leg extended and your other knee bent. Tense the muscles in the front of your left / right thigh. You should see your kneecap slide up or see increased dimpling just above the knee. Your thigh may even shake a bit. Keep these muscles tight  as you raise your leg 4-6 inches (10-15 cm) off the floor. Do not let your knee bend. Hold this position for __________ seconds. Keep these muscles tense as you lower your leg. Relax your muscles slowly and completely after each repetition. Repeat __________ times. Complete this exercise __________ times a day. Hamstring, isometric  Lie on your back on a firm surface. Bend your left / right knee about __________ degrees. Dig your left / right heel into the surface as if you are trying to pull it toward your buttocks. Tighten the muscles in the back of your thighs (hamstring) to "dig" as hard as you can without increasing any pain. Hold this position for __________ seconds. Release the tension gradually and allow your muscles to relax completely for __________ seconds after each repetition. Repeat __________ times. Complete this exercise __________ times a day. Hamstring curls If told by your health care provider, do this exercise while wearing ankle weights. Begin with __________lb / kg weights. Then increase the weight by 1 lb (0.5 kg) increments. Do not wear ankle weights that are more than __________lb / kg. Lie on your abdomen with your legs straight. Bend your left / right knee as far as you can without feeling  pain. Keep your hips flat against the floor. Hold this position for __________ seconds. Slowly lower your leg to the starting position. Repeat __________ times. Complete this exercise __________ times a day. Squats This exercise strengthens the muscles in front of your thigh and knee (quadriceps). Stand in front of a table, with your feet and knees pointing straight ahead. You may rest your hands on the table for balance but not for support. Slowly bend your knees and lower your hips like you are going to sit in a chair. Keep your weight over your heels, not over your toes. Keep your lower legs upright so they are parallel with the table legs. Do not let your hips go lower than your knees. Do not bend lower than told by your health care provider. If your knee pain increases, do not bend as low. Hold the squat position for __________ seconds. Slowly push with your legs to return to standing. Do not use your hands to pull yourself to standing. Repeat __________ times. Complete this exercise __________ times a day. Wall slides This exercise strengthens the muscles in front of your thigh and knee (quadriceps). Lean your back against a smooth wall or door, and walk your feet out 18-24 inches (46-61 cm) from it. Place your feet hip-width apart. Slowly slide down the wall or door until your knees bend __________ degrees. Keep your knees over your heels, not over your toes. Keep your knees in line with your hips. Hold this position for __________ seconds. Repeat __________ times. Complete this exercise __________ times a day. Straight leg raises, side-lying This exercise strengthens the muscles that rotate the leg at the hip and move it away from your body (hip abductors). Lie on your side with your left / right leg in the top position. Lie so your head, shoulder, knee, and hip line up. You may bend your bottom knee to help you keep your balance. Roll your hips slightly forward so your hips are  stacked directly over each other and your left / right knee is facing forward. Leading with your heel, lift your top leg 4-6 inches (10-15 cm). You should feel the muscles in your outer hip lifting. Do not let your foot drift forward. Do not let your knee roll  toward the ceiling. Hold this position for __________ seconds. Slowly return your leg to the starting position. Let your muscles relax completely after each repetition. Repeat __________ times. Complete this exercise __________ times a day. Straight leg raises, prone This exercise stretches the muscles that move your hips away from the front of the pelvis (hip extensors). Lie on your abdomen on a firm surface. You can put a pillow under your hips if that is more comfortable. Tense the muscles in your buttocks and lift your left / right leg about 4-6 inches (10-15 cm). Keep your knee straight as you lift your leg. Hold this position for __________ seconds. Slowly lower your leg to the starting position. Let your leg relax completely after each repetition. Repeat __________ times. Complete this exercise __________ times a day. This information is not intended to replace advice given to you by your health care provider. Make sure you discuss any questions you have with your health care provider. Document Revised: 09/09/2020 Document Reviewed: 09/09/2020 Else[employer] Patient Education  [place].

## 2021-01-22 LAB — ANA: Anti Nuclear Antibody (ANA): NEGATIVE

## 2021-01-22 LAB — RHEUMATOID FACTOR: Rheumatoid fact SerPl-aCnc: <14 IU/mL (ref <14)

## 2021-01-22 LAB — ANGIOTENSIN CONVERTING ENZYME: Angiotensin-Converting Enzyme: 17 U/L (ref 9–67)

## 2021-01-22 LAB — CYCLIC CITRUL PEPTIDE ANTIBODY, IGG: Cyclic Citrullin Peptide Ab: <16 UNITS

## 2021-01-22 NOTE — Progress Notes (Signed)
All the labs are within normal limits.  We will discuss results at the follow-up visit.

## 2021-01-27 NOTE — Progress Notes (Signed)
Office Visit Note  Patient: Zoe Woodard             Date of Birth: 1980/07/08           MRN: [id number]             PCP: Eulis Foster, MD Referring: Donnal Moat* Visit Date: 02/10/2021 Occupation: @GUAROCC @  Subjective:  Discuss lab results   History of Present Illness: Zoe Woodard is a 41 y.o. female with history of osteoarthritis.  Patient presents today to discuss lab results.  Patient reports that the pain she was experiencing in her elbows and knee joints has improved significantly.  She has been performing home exercises which has alleviated her symptoms.  She denies any joint swelling.  She states that on Saturday she was lifting a heavy case of water and has had some increased discomfort in her right wrist.  She has been alternating using heat and ice as well as wearing a brace which has been improving her symptoms.  She takes Tylenol as needed for pain relief.  She denies any bruising or swelling in her right wrist.  She denies any other joint pain or inflammation at this time.  She denies any other new concerns since her initial visit.   Activities of Daily Living:  Patient reports morning stiffness for 30 minutes.   Patient Reports nocturnal pain.  Difficulty dressing/grooming: Denies Difficulty climbing stairs: Denies Difficulty getting out of chair: Denies Difficulty using hands for taps, buttons, cutlery, and/or writing: Denies  Review of Systems  Constitutional:  Negative for fatigue.  HENT:  Negative for mouth sores, mouth dryness and nose dryness.   Eyes:  Positive for itching and dryness. Negative for pain.  Respiratory:  Positive for shortness of breath. Negative for difficulty breathing.   Cardiovascular:  Negative for chest pain and palpitations.  Gastrointestinal:  Negative for blood in stool, constipation and diarrhea.  Endocrine: Negative for increased urination.  Genitourinary:  Negative for difficulty urinating.   Musculoskeletal:  Positive for myalgias, morning stiffness, muscle tenderness and myalgias. Negative for joint pain, joint pain and joint swelling.  Skin:  Negative for color change, rash and redness.  Allergic/Immunologic: Negative for susceptible to infections.  Neurological:  Positive for headaches. Negative for dizziness, numbness, memory loss and weakness.  Hematological:  Negative for bruising/bleeding tendency.  Psychiatric/Behavioral:  Negative for confusion.    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   CKD (chronic kidney disease) stage 4, GFR 15-29 ml/min ([place]) 10/10/2019   Hyperlipidemia 08/22/2019   Lung nodule 08/22/2019   Breast mass 08/22/2019   Chronic cough 08/05/2019   Essential hypertension 07/22/2019   Healthcare maintenance 08/04/2016    Past Medical History:  Diagnosis Date   Abdominal pain 12/28/2019   Back pain 10/10/2019   Chronic kidney disease    Ear pain, bilateral 04/15/2020   Hyperlipidemia    Hypertensive urgency 07/22/2019   Hypocalcemia 08/22/2019   Low blood pressure 02/07/2020   Obesity    Pyelonephritis 11/08/2019    Family History  Problem Relation Age of Onset   Diabetes Mother  Healthy Son    Healthy Daughter    Healthy Daughter    Past Surgical History:  Procedure Laterality Date   ABDOMINAL HYSTERECTOMY     APPENDECTOMY     bladder laceration  01/04/2015   excision of thyroglobal duct cyst  02/01/2012   right ovarian cystoectomy  01/02/2015   right ovarian egstectomy  04/27/2013   SPINE SURGERY     TONSILLECTOMY     TUBAL  LIGATION     Social History   Social History Narrative   Lives with 3 children    Not working currently   Not sexually active.    Immunization History  Administered Date(s) Administered   Influenza,inj,Quad PF,6+ Mos 10/06/2017, 10/06/2018, 09/25/2019, 10/27/2020   Pneumococcal Conjugate-13 08/11/2019   Tdap 10/27/2020     Objective: Vital Signs: BP 113/82 (BP Location: Right Arm, Patient Position: Sitting, Cuff Size: Large)    Pulse 92    Ht 5\' 3"  (1.6 m)    Wt 192 lb (87.1 kg)    BMI 34.01 kg/m    Physical Exam Vitals and nursing note reviewed.  Constitutional:      Appearance: She is well-developed.  HENT:     Head: Normocephalic and atraumatic.  Eyes:     Conjunctiva/sclera: Conjunctivae normal.  Cardiovascular:     Rate and Rhythm: Normal rate and regular rhythm.     Heart sounds: Normal heart sounds.  Pulmonary:     Effort: Pulmonary effort is normal.     Breath sounds: Normal breath sounds.  Abdominal:     General: Bowel sounds are normal.     Palpations: Abdomen is soft.  Musculoskeletal:     Cervical back: Normal range of motion.  Lymphadenopathy:     Cervical: No cervical adenopathy.  Skin:    General: Skin is warm and dry.     Capillary Refill: Capillary refill takes less than 2 seconds.  Neurological:     Mental Status: She is alert and oriented to person, place, and time.  Psychiatric:        Behavior: Behavior normal.     Musculoskeletal Exam: C-spine, thoracic spine, lumbar spine have good range of motion with no discomfort.  No midline spinal tenderness or SI joint tenderness.  Shoulder joints, elbow joints, wrist joints, MCPs, PIPs, DIPs have good range of motion with no synovitis.  No tenderness over the lateral epicondyle of both elbows.  Complete fist formation bilaterally.  Hip joints have good range of motion with no groin pain.  Knee joints have good range of motion with no warmth or effusion.  Ankle joints have good range of motion with no  tenderness or joint swelling.  No tenderness over MTP joints.  No evidence of Achilles tendinitis or plantar fasciitis.  CDAI Exam: CDAI Score: -- Patient Global: --; Provider Global: -- Swollen: --; Tender: -- Joint Exam 02/10/2021   No joint exam has been documented for this visit   There is currently no information documented on the homunculus. Go to the Rheumatology activity and complete the homunculus joint exam.  Investigation: No additional findings.  Imaging: No results found.  Recent Labs: Lab Results  Component Value Date   WBC 17.3 (H) 11/19/2020   HGB 11.5 (L) 11/19/2020   PLT 397 11/19/2020   NA 138 11/19/2020   K 3.9 11/19/2020   CL 107 11/19/2020   CO2 20 (L) 11/19/2020   GLUCOSE 92 11/19/2020   BUN 27 (H) 11/19/2020   CREATININE 2.24 (H) 11/19/2020  BILITOT 0.3 11/19/2020   ALKPHOS 148 (H) 11/19/2020   AST 23 11/19/2020   ALT 30 11/19/2020   PROT 7.0 11/19/2020   ALBUMIN 3.2 (L) 11/19/2020   CALCIUM 8.2 (L) 11/19/2020   GFRAA 31 (L) 02/07/2020    January 20, 2021 RF negative, anti-CCP negative, ANA negative, ACE negative  Speciality Comments: No specialty comments available.  Procedures:  No procedures performed Allergies: Patient has no known allergies.     Assessment / Plan:     Visit Diagnoses: Lateral epicondylitis of both elbows - History of pain in bilateral elbows.  X-rays were unremarkable.  She had previous fracture of her left elbow.  Clinical findings are consistent with bilateral lateral tonsillitis.  Her symptoms have improved significantly since her initial office visit.  She has been performing home exercises which have alleviated her symptoms.  On examination today she did not have any tenderness over the lateral epicondyle of both elbows.  No elbow joint line tenderness noted.  No olecranon bursitis noted.  She was advised to notify us if her symptoms return.  She will follow-up as needed.  Bilateral chronic knee pain - X-rays of  bilateral knee joints were unremarkable except for patellar spurring.  A handout on exercises was given.  All autoimmune work-up was negative: lab work from 01/20/21 was reviewed today in the office: RF negative, anti-CCP negative, ANA negative, and Ace 17.  All questions were addressed.  She has good ROM of both knee joints on examination today.  No warmth or effusion of knee joints noted.   Discussed the importance of lower extremity muscle strengthening.  She was advised to notify us if she develops any increased joint pain or joint swelling.   History of leukocytosis - Followed by hematology.  Elevated sed rate - Followed by hematology.  CKD (chronic kidney disease) stage 4, GFR 15-29 ml/min ([facility]) - Followed by Dr. Moshe Cipro.  Essential hypertension: BP was 113/82 today in the office.   Hypertriglyceridemia  Myalgia due to statin  Adnexal cyst - Followed by GYN.  Orders: No orders of the defined types were placed in this encounter.  No orders of the defined types were placed in this encounter.     Follow-Up Instructions: Return if symptoms worsen or fail to improve, for Osteoarthritis.   Ofilia Neas, PA-C  Note - This record has been created using Dragon software.  Chart creation errors have been sought, but may not always  have been located. Such creation errors do not reflect on  the standard of medical care.

## 2021-02-05 ENCOUNTER — Encounter: Payer: Self-pay | Admitting: *Deleted

## 2021-02-05 ENCOUNTER — Telehealth: Payer: Self-pay | Admitting: *Deleted

## 2021-02-05 NOTE — Telephone Encounter (Signed)
This RN received call from the patient stating she received message stating need for an appt with Dr Chryl Heck.  This RN informed her per communication with her rheumatologist - Dr Chryl Heck would like to follow up- not urgently needed.  Per discussion with pt and her schedule appt made for 02/26/2021.

## 2021-02-10 ENCOUNTER — Ambulatory Visit (INDEPENDENT_AMBULATORY_CARE_PROVIDER_SITE_OTHER): Payer: Medicaid Other | Admitting: Physician Assistant

## 2021-02-10 ENCOUNTER — Other Ambulatory Visit: Payer: Self-pay

## 2021-02-10 ENCOUNTER — Encounter: Payer: Self-pay | Admitting: Physician Assistant

## 2021-02-10 VITALS — BP 113/82 | HR 92 | Ht 63.0 in | Wt 192.0 lb

## 2021-02-10 DIAGNOSIS — M791 Myalgia, unspecified site: Secondary | ICD-10-CM | POA: Diagnosis not present

## 2021-02-10 DIAGNOSIS — T466X5A Adverse effect of antihyperlipidemic and antiarteriosclerotic drugs, initial encounter: Secondary | ICD-10-CM

## 2021-02-10 DIAGNOSIS — R700 Elevated erythrocyte sedimentation rate: Secondary | ICD-10-CM | POA: Diagnosis not present

## 2021-02-10 DIAGNOSIS — E781 Pure hyperglyceridemia: Secondary | ICD-10-CM

## 2021-02-10 DIAGNOSIS — N949 Unspecified condition associated with female genital organs and menstrual cycle: Secondary | ICD-10-CM | POA: Diagnosis not present

## 2021-02-10 DIAGNOSIS — M25561 Pain in right knee: Secondary | ICD-10-CM

## 2021-02-10 DIAGNOSIS — Z862 Personal history of diseases of the blood and blood-forming organs and certain disorders involving the immune mechanism: Secondary | ICD-10-CM

## 2021-02-10 DIAGNOSIS — M7712 Lateral epicondylitis, left elbow: Secondary | ICD-10-CM

## 2021-02-10 DIAGNOSIS — N184 Chronic kidney disease, stage 4 (severe): Secondary | ICD-10-CM | POA: Diagnosis not present

## 2021-02-10 DIAGNOSIS — I10 Essential (primary) hypertension: Secondary | ICD-10-CM

## 2021-02-10 DIAGNOSIS — M25562 Pain in left knee: Secondary | ICD-10-CM | POA: Diagnosis not present

## 2021-02-10 DIAGNOSIS — M7711 Lateral epicondylitis, right elbow: Secondary | ICD-10-CM

## 2021-02-10 DIAGNOSIS — G8929 Other chronic pain: Secondary | ICD-10-CM

## 2021-02-10 NOTE — Patient Instructions (Signed)
Knee Exercises Ask your health care provider which exercises are safe for you. Do exercises exactly as told by your health care provider and adjust them as directed. It is normal to feel mild stretching, pulling, tightness, or discomfort as you do these exercises. Stop right away if you feel sudden pain or your pain gets worse. Do not begin these exercises until told by your health care provider. Stretching and range-of-motion exercises These exercises warm up your muscles and joints and improve the movement and flexibility of your knee. These exercises also help to relieve pain and swelling. Knee extension, prone  Lie on your abdomen (prone position) on a bed. Place your left / right knee just beyond the edge of the surface so your knee is not on the bed. You can put a towel under your left / right thigh just above your kneecap for comfort. Relax your leg muscles and allow gravity to straighten your knee (extension). You should feel a stretch behind your left / right knee. Hold this position for __________ seconds. Scoot up so your knee is supported between repetitions. Repeat __________ times. Complete this exercise __________ times a day. Knee flexion, active  Lie on your back with both legs straight. If this causes back discomfort, bend your left / right knee so your foot is flat on the floor. Slowly slide your left / right heel back toward your buttocks. Stop when you feel a gentle stretch in the front of your knee or thigh (flexion). Hold this position for __________ seconds. Slowly slide your left / right heel back to the starting position. Repeat __________ times. Complete this exercise __________ times a day. Quadriceps stretch, prone  Lie on your abdomen on a firm surface, such as a bed or padded floor. Bend your left / right knee and hold your ankle. If you cannot reach your ankle or pant leg, loop a belt around your foot and grab the belt instead. Gently pull your heel toward your  buttocks. Your knee should not slide out to the side. You should feel a stretch in the front of your thigh and knee (quadriceps). Hold this position for __________ seconds. Repeat __________ times. Complete this exercise __________ times a day. Hamstring, supine  Lie on your back (supine position). Loop a belt or towel over the ball of your left / right foot. The ball of your foot is on the walking surface, right under your toes. Straighten your left / right knee and slowly pull on the belt to raise your leg until you feel a gentle stretch behind your knee (hamstring). Do not let your knee bend while you do this. Keep your other leg flat on the floor. Hold this position for __________ seconds. Repeat __________ times. Complete this exercise __________ times a day. Strengthening exercises These exercises build strength and endurance in your knee. Endurance is the ability to use your muscles for a long time, even after they get tired. Quadriceps, isometric This exercise strengthens the muscles in front of your thigh (quadriceps) without moving your knee joint (isometric). Lie on your back with your left / right leg extended and your other knee bent. Put a rolled towel or small pillow under your knee if told by your health care provider. Slowly tense the muscles in the front of your left / right thigh. You should see your kneecap slide up toward your hip or see increased dimpling just above the knee. This motion will push the back of the knee toward the floor.   For __________ seconds, hold the muscle as tight as you can without increasing your pain. Relax the muscles slowly and completely. Repeat __________ times. Complete this exercise __________ times a day. Straight leg raises This exercise strengthens the muscles in front of your thigh (quadriceps) and the muscles that move your hips (hip flexors). Lie on your back with your left / right leg extended and your other knee bent. Tense the  muscles in the front of your left / right thigh. You should see your kneecap slide up or see increased dimpling just above the knee. Your thigh may even shake a bit. Keep these muscles tight as you raise your leg 4-6 inches (10-15 cm) off the floor. Do not let your knee bend. Hold this position for __________ seconds. Keep these muscles tense as you lower your leg. Relax your muscles slowly and completely after each repetition. Repeat __________ times. Complete this exercise __________ times a day. Hamstring, isometric  Lie on your back on a firm surface. Bend your left / right knee about __________ degrees. Dig your left / right heel into the surface as if you are trying to pull it toward your buttocks. Tighten the muscles in the back of your thighs (hamstring) to "dig" as hard as you can without increasing any pain. Hold this position for __________ seconds. Release the tension gradually and allow your muscles to relax completely for __________ seconds after each repetition. Repeat __________ times. Complete this exercise __________ times a day. Hamstring curls If told by your health care provider, do this exercise while wearing ankle weights. Begin with __________lb / kg weights. Then increase the weight by 1 lb (0.5 kg) increments. Do not wear ankle weights that are more than __________lb / kg. Lie on your abdomen with your legs straight. Bend your left / right knee as far as you can without feeling pain. Keep your hips flat against the floor. Hold this position for __________ seconds. Slowly lower your leg to the starting position. Repeat __________ times. Complete this exercise __________ times a day. Squats This exercise strengthens the muscles in front of your thigh and knee (quadriceps). Stand in front of a table, with your feet and knees pointing straight ahead. You may rest your hands on the table for balance but not for support. Slowly bend your knees and lower your hips like you  are going to sit in a chair. Keep your weight over your heels, not over your toes. Keep your lower legs upright so they are parallel with the table legs. Do not let your hips go lower than your knees. Do not bend lower than told by your health care provider. If your knee pain increases, do not bend as low. Hold the squat position for __________ seconds. Slowly push with your legs to return to standing. Do not use your hands to pull yourself to standing. Repeat __________ times. Complete this exercise __________ times a day. Wall slides This exercise strengthens the muscles in front of your thigh and knee (quadriceps). Lean your back against a smooth wall or door, and walk your feet out 18-24 inches (46-61 cm) from it. Place your feet hip-width apart. Slowly slide down the wall or door until your knees bend __________ degrees. Keep your knees over your heels, not over your toes. Keep your knees in line with your hips. Hold this position for __________ seconds. Repeat __________ times. Complete this exercise __________ times a day. Straight leg raises, side-lying This exercise strengthens the muscles that rotate   the leg at the hip and move it away from your body (hip abductors). Lie on your side with your left / right leg in the top position. Lie so your head, shoulder, knee, and hip line up. You may bend your bottom knee to help you keep your balance. Roll your hips slightly forward so your hips are stacked directly over each other and your left / right knee is facing forward. Leading with your heel, lift your top leg 4-6 inches (10-15 cm). You should feel the muscles in your outer hip lifting. Do not let your foot drift forward. Do not let your knee roll toward the ceiling. Hold this position for __________ seconds. Slowly return your leg to the starting position. Let your muscles relax completely after each repetition. Repeat __________ times. Complete this exercise __________ times a  day. Straight leg raises, prone This exercise stretches the muscles that move your hips away from the front of the pelvis (hip extensors). Lie on your abdomen on a firm surface. You can put a pillow under your hips if that is more comfortable. Tense the muscles in your buttocks and lift your left / right leg about 4-6 inches (10-15 cm). Keep your knee straight as you lift your leg. Hold this position for __________ seconds. Slowly lower your leg to the starting position. Let your leg relax completely after each repetition. Repeat __________ times. Complete this exercise __________ times a day. This information is not intended to replace advice given to you by your health care provider. Make sure you discuss any questions you have with your health care provider. Document Revised: 09/09/2020 Document Reviewed: 09/09/2020 [employer] Patient Education  2022 [employer]  

## 2021-02-18 ENCOUNTER — Other Ambulatory Visit: Payer: Self-pay | Admitting: Family Medicine

## 2021-02-18 DIAGNOSIS — I10 Essential (primary) hypertension: Secondary | ICD-10-CM

## 2021-02-26 ENCOUNTER — Other Ambulatory Visit: Payer: Self-pay

## 2021-02-26 ENCOUNTER — Inpatient Hospital Stay: Payer: Medicaid Other | Attending: Hematology and Oncology | Admitting: Hematology and Oncology

## 2021-02-26 ENCOUNTER — Inpatient Hospital Stay: Payer: Medicaid Other

## 2021-02-26 ENCOUNTER — Encounter: Payer: Self-pay | Admitting: Hematology and Oncology

## 2021-02-26 ENCOUNTER — Telehealth: Payer: Self-pay

## 2021-02-26 VITALS — BP 106/92 | HR 93 | Temp 97.9°F | Resp 16 | Ht 63.0 in | Wt 190.2 lb

## 2021-02-26 DIAGNOSIS — R748 Abnormal levels of other serum enzymes: Secondary | ICD-10-CM | POA: Diagnosis not present

## 2021-02-26 DIAGNOSIS — D649 Anemia, unspecified: Secondary | ICD-10-CM | POA: Diagnosis not present

## 2021-02-26 DIAGNOSIS — E538 Deficiency of other specified B group vitamins: Secondary | ICD-10-CM | POA: Insufficient documentation

## 2021-02-26 DIAGNOSIS — D72829 Elevated white blood cell count, unspecified: Secondary | ICD-10-CM

## 2021-02-26 DIAGNOSIS — N189 Chronic kidney disease, unspecified: Secondary | ICD-10-CM | POA: Diagnosis not present

## 2021-02-26 DIAGNOSIS — D72825 Bandemia: Secondary | ICD-10-CM

## 2021-02-26 LAB — CMP (CANCER CENTER ONLY)
ALT: 42 U/L (ref 0–44)
AST: 24 U/L (ref 15–41)
Albumin: 3.8 g/dL (ref 3.5–5.0)
Alkaline Phosphatase: 153 U/L — ABNORMAL HIGH (ref 38–126)
Anion gap: 7 (ref 5–15)
BUN: 33 mg/dL — ABNORMAL HIGH (ref 6–20)
CO2: 21 mmol/L — ABNORMAL LOW (ref 22–32)
Calcium: 8.6 mg/dL — ABNORMAL LOW (ref 8.9–10.3)
Chloride: 107 mmol/L (ref 98–111)
Creatinine: 2.41 mg/dL — ABNORMAL HIGH (ref 0.44–1.00)
GFR, Estimated: 25 mL/min — ABNORMAL LOW (ref >60)
Glucose, Bld: 189 mg/dL — ABNORMAL HIGH (ref 70–99)
Potassium: 4.3 mmol/L (ref 3.5–5.1)
Sodium: 135 mmol/L (ref 135–145)
Total Bilirubin: 0.3 mg/dL (ref 0.3–1.2)
Total Protein: 7.3 g/dL (ref 6.5–8.1)

## 2021-02-26 LAB — CBC WITH DIFFERENTIAL (CANCER CENTER ONLY)
Abs Immature Granulocytes: 0.06 10*3/uL (ref 0.00–0.07)
Basophils Absolute: 0 10*3/uL (ref 0.0–0.1)
Basophils Relative: 0 %
Eosinophils Absolute: 0.1 10*3/uL (ref 0.0–0.5)
Eosinophils Relative: 1 %
HCT: 36.9 % (ref 36.0–46.0)
Hemoglobin: 11.9 g/dL — ABNORMAL LOW (ref 12.0–15.0)
Immature Granulocytes: 0 %
Lymphocytes Relative: 21 %
Lymphs Abs: 3.2 10*3/uL (ref 0.7–4.0)
MCH: 26.9 pg (ref 26.0–34.0)
MCHC: 32.2 g/dL (ref 30.0–36.0)
MCV: 83.3 fL (ref 80.0–100.0)
Monocytes Absolute: 0.5 10*3/uL (ref 0.1–1.0)
Monocytes Relative: 4 %
Neutro Abs: 11.5 10*3/uL — ABNORMAL HIGH (ref 1.7–7.7)
Neutrophils Relative %: 74 %
Platelet Count: 378 10*3/uL (ref 150–400)
RBC: 4.43 MIL/uL (ref 3.87–5.11)
RDW: 13.7 % (ref 11.5–15.5)
WBC Count: 15.4 10*3/uL — ABNORMAL HIGH (ref 4.0–10.5)
nRBC: 0 % (ref 0.0–0.2)

## 2021-02-26 LAB — C-REACTIVE PROTEIN: CRP: 5 mg/dL — ABNORMAL HIGH (ref <1.0)

## 2021-02-26 LAB — SEDIMENTATION RATE: Sed Rate: 50 mm/hr — ABNORMAL HIGH (ref 0–22)

## 2021-02-26 NOTE — Telephone Encounter (Signed)
Attempted to call pt per MD to schedule [facility] for 2/20. Wilber Bihari, NP, infusion nurses, and path available. Pt accepted appt for 2/20 at 0730. High priority message sent to scheduling, Estill Bamberg in flourocytometry aware. Pt has an overall understanding of procedure and appt details.

## 2021-02-26 NOTE — Progress Notes (Signed)
[place]  PROGRESS NOTE  Patient Care Team: Eulis Foster, MD as PCP - General (Family Medicine)  CHIEF COMPLAINTS/PURPOSE OF CONSULTATION:  Leukocytosis  INTERVAL HISTORY:   Zoe Woodard 41 y.o. female returns for follow-up for leukocytosis. She is here for follow-up.  Since her last visit, she has seen rheumatology, they have done some work-up, they do not believe its primary rheumatological disorder.  She continues to have some pains in her knees and her elbows.  No B symptoms.  No change in breathing or bowel habits or urinary habits. She has no other complaints.  Rest of the 10 point ROS is below.   MEDICAL HISTORY:  Past Medical History:  Diagnosis Date   Abdominal pain 12/28/2019   Back pain 10/10/2019   Chronic kidney disease    Ear pain, bilateral 04/15/2020   Hyperlipidemia    Hypertensive urgency 07/22/2019   Hypocalcemia 08/22/2019   Low blood pressure 02/07/2020   Obesity    Pyelonephritis 11/08/2019    SURGICAL HISTORY: Past Surgical History:  Procedure Laterality Date   ABDOMINAL HYSTERECTOMY     APPENDECTOMY     bladder laceration  01/04/2015   excision of thyroglobal duct cyst  02/01/2012   right ovarian cystoectomy  01/02/2015   right ovarian egstectomy  04/27/2013   SPINE SURGERY     TONSILLECTOMY     TUBAL LIGATION      SOCIAL HISTORY: Social History   Socioeconomic History   Marital status: Single    Spouse name: Not on file   Number of children: Not on file   Years of education: Not on file   Highest education level: Not on file  Occupational History   Not on file  Tobacco Use   Smoking status: Never   Smokeless tobacco: Never  Vaping Use   Vaping Use: Never used  Substance and Sexual Activity   Alcohol use: No   Drug use: No   Sexual activity: Not Currently    Partners: Male    Birth control/protection: Surgical  Other Topics Concern   Not on file  Social History Narrative   Lives with 3 children     Not working currently   Not sexually active.    Social Determinants of Health   Financial Resource Strain: Not on file  Food Insecurity: Not on file  Transportation Needs: Not on file  Physical Activity: Not on file  Stress: Not on file  Social Connections: Not on file  Intimate Partner Violence: Not on file    FAMILY HISTORY: Family History  Problem Relation Age of Onset   Diabetes Mother    Healthy Son    Healthy Daughter    Healthy Daughter     ALLERGIES:  has No Known Allergies.  MEDICATIONS:  Current Outpatient Medications  Medication Sig Dispense Refill   benzonatate (TESSALON) 100 MG capsule Take 1 capsule (100 mg total) by mouth 2 (two) times daily as needed. 180 capsule 2   diclofenac Sodium (VOLTAREN) 1 % GEL Apply 4 g topically 4 (four) times daily. 150 g 6   erythromycin ophthalmic ointment Place a 1/2 inch ribbon of ointment into the lower eyelid at bedtime x7 days (Patient not taking: Reported on 01/20/2021) 3.5 g 0   famotidine (PEPCID) 20 MG tablet TAKE 1 TABLET BY MOUTH TWICE A DAY 60 tablet 3   Lidocaine (HM LIDOCAINE PATCH) 4 % PTCH Apply 1 each topically in the morning, at noon, in the evening, and at bedtime. (Patient  not taking: Reported on 01/20/2021) 15 patch 2   loratadine (CLARITIN) 10 MG tablet TAKE 1 TABLET BY MOUTH EVERY DAY 30 tablet 11   losartan (COZAAR) 50 MG tablet Take 50 mg by mouth daily.     metoprolol tartrate (LOPRESSOR) 25 MG tablet TAKE 1 TABLET BY MOUTH TWICE A DAY 120 tablet 0   montelukast (SINGULAIR) 10 MG tablet TAKE 1 TABLET BY MOUTH EVERYDAY AT BEDTIME 90 tablet 1   polyethylene glycol powder (GLYCOLAX/MIRALAX) 17 GM/SCOOP powder Take 17 g by mouth daily as needed. 510 g 1   traMADol (ULTRAM) 50 MG tablet Take 1 tablet (50 mg total) by mouth every 12 (twelve) hours as needed for severe pain. 15 tablet 0   trolamine salicylate (ASPER-FLEX) 10 % cream Apply 1 application topically as needed for muscle pain. 85 g 0   vitamin B-12  (CYANOCOBALAMIN) 1000 MCG tablet Take 1,000 mcg by mouth daily.     No current facility-administered medications for this visit.     PHYSICAL EXAMINATION: ECOG PERFORMANCE STATUS: 0 - Asymptomatic  Vitals:   02/26/21 1127  BP: (!) 106/92  Pulse: 93  Resp: 16  Temp: 97.9 F (36.6 C)  SpO2: 99%    Filed Weights   02/26/21 1127  Weight: 190 lb 3.2 oz (86.3 kg)   Physical Exam Constitutional:      Appearance: Normal appearance. She is not ill-appearing.  HENT:     Head: Normocephalic and atraumatic.     Mouth/Throat:     Mouth: Mucous membranes are moist.     Pharynx: Oropharynx is clear.  Eyes:     General: No scleral icterus.    Extraocular Movements: Extraocular movements intact.  Cardiovascular:     Rate and Rhythm: Normal rate and regular rhythm.  Pulmonary:     Effort: Pulmonary effort is normal.     Breath sounds: Normal breath sounds.  Abdominal:     General: Abdomen is flat. Bowel sounds are normal. There is no distension.     Tenderness: There is no abdominal tenderness.  Musculoskeletal:        General: No swelling or tenderness.     Cervical back: Normal range of motion and neck supple.     Right lower leg: No edema.  Skin:    General: Skin is warm and dry.     Coloration: Skin is not jaundiced.  Neurological:     General: No focal deficit present.     Mental Status: She is alert and oriented to person, place, and time.  Psychiatric:        Mood and Affect: Mood normal.     LABORATORY DATA:  I have reviewed the data as listed Lab Results  Component Value Date   WBC 15.4 (H) 02/26/2021   HGB 11.9 (L) 02/26/2021   HCT 36.9 02/26/2021   MCV 83.3 02/26/2021   PLT 378 02/26/2021     Chemistry      Component Value Date/Time   NA 138 11/19/2020 1418   NA 139 02/07/2020 1116   K 3.9 11/19/2020 1418   CL 107 11/19/2020 1418   CO2 20 (L) 11/19/2020 1418   BUN 27 (H) 11/19/2020 1418   BUN 27 (H) 02/07/2020 1116   CREATININE 2.24 (H) 11/19/2020  1418      Component Value Date/Time   CALCIUM 8.2 (L) 11/19/2020 1418   ALKPHOS 148 (H) 11/19/2020 1418   AST 23 11/19/2020 1418   ALT 30 11/19/2020 1418   BILITOT  0.3 11/19/2020 1418     I have reviewed pertinent labs from last visit.  We will repeat CBC, CMP and flow today.  RADIOGRAPHIC STUDIES: I have personally reviewed the radiological images as listed and agreed with the findings in the report. No results found.   ASSESSMENT & PLAN:   Zoe Woodard is a 41 y.o. female who presents to the clinic for evaluation for   1.  Leukocytosis, neutrophil predominant: --Workup so far has included MPN panel and BCR/ABL FISH that was negative.  --Patient denies any infectious symptoms.  --Patient denies smoking. --Labs today continue to show persistent leukocytosis and neutrophilia.  She has followed up with rheumatology, negative work-up so far.  Given unexplained leukocytosis, we have discussed about proceeding with bone marrow aspiration and biopsy.  She is agreeable.  If bone marrow aspiration and biopsy negative for any primary bone marrow disorder, then we will continue to follow-up periodically every 6 months with repeat CBC. She is agreeable to all the recommendations.  2.  B12 deficiency: -- Last labs show vitamin B12 levels within normal limits. --Currently on B12 supplementation 1000 mcg daily.  Recommend to continue.   3.  Normocytic anemia, mild: --Hgb is 11.9 likely related to chronic kidney disease.  --Continue to monitor.   4.  Elevated alkaline phosphatase: --Mildly elevated and overall stable over the last several months.  -CMP from today pending.   All questions were answered. The patient knows to call the clinic with any problems, questions or concerns.  I have spent a total of 20 minutes minutes of face-to-face and non-face-to-face time, preparing to see the patient, obtaining and/or reviewing separately obtained history, performing a medically appropriate  examination, counseling and educating the patient, ordering tests, referring and communicating with other health care professionals, documenting clinical information in the electronic health record, independently interpreting results and communicating results to the patient, and care coordination.   Benay Pike [facility] [place] P: [phone]

## 2021-02-28 ENCOUNTER — Other Ambulatory Visit: Payer: Self-pay | Admitting: Family Medicine

## 2021-02-28 DIAGNOSIS — R053 Chronic cough: Secondary | ICD-10-CM

## 2021-03-02 ENCOUNTER — Inpatient Hospital Stay (HOSPITAL_BASED_OUTPATIENT_CLINIC_OR_DEPARTMENT_OTHER): Payer: Medicaid Other | Admitting: Adult Health

## 2021-03-02 ENCOUNTER — Inpatient Hospital Stay: Payer: Medicaid Other

## 2021-03-02 ENCOUNTER — Other Ambulatory Visit: Payer: Self-pay

## 2021-03-02 VITALS — BP 108/80 | HR 81 | Temp 98.4°F | Resp 18

## 2021-03-02 DIAGNOSIS — D72825 Bandemia: Secondary | ICD-10-CM

## 2021-03-02 DIAGNOSIS — D75839 Thrombocytosis, unspecified: Secondary | ICD-10-CM

## 2021-03-02 DIAGNOSIS — D649 Anemia, unspecified: Secondary | ICD-10-CM

## 2021-03-02 DIAGNOSIS — D72829 Elevated white blood cell count, unspecified: Secondary | ICD-10-CM | POA: Diagnosis present

## 2021-03-02 LAB — CBC WITH DIFFERENTIAL/PLATELET
Abs Immature Granulocytes: 0.08 10*3/uL — ABNORMAL HIGH (ref 0.00–0.07)
Basophils Absolute: 0.1 10*3/uL (ref 0.0–0.1)
Basophils Relative: 0 %
Eosinophils Absolute: 0.1 10*3/uL (ref 0.0–0.5)
Eosinophils Relative: 1 %
HCT: 34.7 % — ABNORMAL LOW (ref 36.0–46.0)
Hemoglobin: 11.1 g/dL — ABNORMAL LOW (ref 12.0–15.0)
Immature Granulocytes: 1 %
Lymphocytes Relative: 25 %
Lymphs Abs: 3.3 10*3/uL (ref 0.7–4.0)
MCH: 26.4 pg (ref 26.0–34.0)
MCHC: 32 g/dL (ref 30.0–36.0)
MCV: 82.6 fL (ref 80.0–100.0)
Monocytes Absolute: 0.7 10*3/uL (ref 0.1–1.0)
Monocytes Relative: 6 %
Neutro Abs: 9 10*3/uL — ABNORMAL HIGH (ref 1.7–7.7)
Neutrophils Relative %: 67 %
Platelets: 395 10*3/uL (ref 150–400)
RBC: 4.2 MIL/uL (ref 3.87–5.11)
RDW: 13.9 % (ref 11.5–15.5)
WBC: 13.3 10*3/uL — ABNORMAL HIGH (ref 4.0–10.5)
nRBC: 0 % (ref 0.0–0.2)

## 2021-03-02 LAB — CMP (CANCER CENTER ONLY)
ALT: 46 U/L — ABNORMAL HIGH (ref 0–44)
AST: 20 U/L (ref 15–41)
Albumin: 3.8 g/dL (ref 3.5–5.0)
Alkaline Phosphatase: 164 U/L — ABNORMAL HIGH (ref 38–126)
Anion gap: 10 (ref 5–15)
BUN: 39 mg/dL — ABNORMAL HIGH (ref 6–20)
CO2: 18 mmol/L — ABNORMAL LOW (ref 22–32)
Calcium: 8.5 mg/dL — ABNORMAL LOW (ref 8.9–10.3)
Chloride: 110 mmol/L (ref 98–111)
Creatinine: 2.7 mg/dL — ABNORMAL HIGH (ref 0.44–1.00)
GFR, Estimated: 22 mL/min — ABNORMAL LOW (ref >60)
Glucose, Bld: 108 mg/dL — ABNORMAL HIGH (ref 70–99)
Potassium: 3.9 mmol/L (ref 3.5–5.1)
Sodium: 138 mmol/L (ref 135–145)
Total Bilirubin: 0.4 mg/dL (ref 0.3–1.2)
Total Protein: 7.2 g/dL (ref 6.5–8.1)

## 2021-03-02 NOTE — Progress Notes (Signed)
Patient provided with discharge instructions. She verbalized an understanding. Vital signs stable. Bandage checked and no signs of bleeding noted. Patient picked up by her friend, Lelan Pons.

## 2021-03-02 NOTE — Progress Notes (Signed)
INDICATION: leukocytosis  Brief examination was performed. ENT: adequate airway clearance Heart: regular rate and rhythm.No Murmurs Lungs: clear to auscultation, no wheezes, normal respiratory effort  Bone Marrow Biopsy and Aspiration Procedure Note   Informed consent was obtained and potential risks including bleeding, infection and pain were reviewed with the patient.  The patient's name, date of birth, identification, consent and allergies were verified prior to the start of procedure and time out was performed.  The left posterior iliac crest was chosen as the site of biopsy.  The skin was prepped with ChloraPrep.   12 cc of 2% lidocaine was used to provide local anaesthesia.   10 cc of bone marrow aspirate was obtained followed by 1cm biopsy.  Pressure was applied to the biopsy site and bandage was placed over the biopsy site. Patient was made to lie on the back for 30 mins prior to discharge.  The procedure was tolerated well. COMPLICATIONS: None BLOOD LOSS: none The patient was discharged home in stable condition with a 1 week follow up to review results.  Patient was provided with post bone marrow biopsy instructions and instructed to call if there was any bleeding or worsening pain.  Specimens sent for flow cytometry, cytogenetics and additional studies.  Signed Scot Dock, NP

## 2021-03-02 NOTE — Patient Instructions (Signed)
Bone Marrow Aspiration and Bone Marrow Biopsy, Adult, Care After °This sheet gives you information about how to care for yourself after your procedure. Your health care provider may also give you more specific instructions. If you have problems or questions, contact your health care provider. °What can I expect after the procedure? °After the procedure, it is common to have: °Mild pain and tenderness. °Swelling. °Bruising. °Follow these instructions at home: °Puncture site care ° °Follow instructions from your health care provider about how to take care of the puncture site. Make sure you: °Wash your hands with soap and water before and after you change your bandage (dressing). If soap and water are not available, use hand sanitizer. °Change your dressing as told by your health care provider. °Check your puncture site every day for signs of infection. Check for: °More redness, swelling, or pain. °Fluid or blood. °Warmth. °Pus or a bad smell. °Activity °Return to your normal activities as told by your health care provider. Ask your health care provider what activities are safe for you. °Do not lift anything that is heavier than 10 lb (4.5 kg), or the limit that you are told, until your health care provider says that it is safe. °Do not drive for 24 hours if you were given a sedative during your procedure. °General instructions ° °Take over-the-counter and prescription medicines only as told by your health care provider. °Do not take baths, swim, or use a hot tub until your health care provider approves. Ask your health care provider if you may take showers. You may only be allowed to take sponge baths. °If directed, put ice on the affected area. To do this: °Put ice in a plastic bag. °Place a towel between your skin and the bag. °Leave the ice on for 20 minutes, 2-3 times a day. °Keep all follow-up visits as told by your health care provider. This is important. °Contact a health care provider if: °Your pain is not  controlled with medicine. °You have a fever. °You have more redness, swelling, or pain around the puncture site. °You have fluid or blood coming from the puncture site. °Your puncture site feels warm to the touch. °You have pus or a bad smell coming from the puncture site. °Summary °After the procedure, it is common to have mild pain, tenderness, swelling, and bruising. °Follow instructions from your health care provider about how to take care of the puncture site and what activities are safe for you. °Take over-the-counter and prescription medicines only as told by your health care provider. °Contact a health care provider if you have any signs of infection, such as fluid or blood coming from the puncture site. °This information is not intended to replace advice given to you by your health care provider. Make sure you discuss any questions you have with your health care provider. °Document Revised: 05/16/2018 Document Reviewed: 05/16/2018 °Elsevier Patient Education © 2022 [employer] ° °

## 2021-03-03 LAB — SURGICAL PATHOLOGY

## 2021-03-05 ENCOUNTER — Telehealth: Payer: Self-pay | Admitting: Adult Health

## 2021-03-05 NOTE — Telephone Encounter (Signed)
Called patient to update her on the changes that were made to her upcoming appointment. Left message. Patient will be mailed an updated calendar.

## 2021-03-10 ENCOUNTER — Encounter (HOSPITAL_COMMUNITY): Payer: Self-pay | Admitting: Hematology and Oncology

## 2021-03-26 ENCOUNTER — Inpatient Hospital Stay: Payer: Medicaid Other | Admitting: Adult Health

## 2021-04-02 ENCOUNTER — Encounter: Payer: Self-pay | Admitting: Adult Health

## 2021-04-02 ENCOUNTER — Inpatient Hospital Stay: Payer: Medicaid Other | Attending: Hematology and Oncology | Admitting: Adult Health

## 2021-04-02 DIAGNOSIS — D631 Anemia in chronic kidney disease: Secondary | ICD-10-CM | POA: Diagnosis not present

## 2021-04-02 DIAGNOSIS — N184 Chronic kidney disease, stage 4 (severe): Secondary | ICD-10-CM | POA: Diagnosis not present

## 2021-04-02 DIAGNOSIS — D72825 Bandemia: Secondary | ICD-10-CM | POA: Diagnosis not present

## 2021-04-02 NOTE — Progress Notes (Signed)
[facility] Follow up: ?  ? ?Simmons-Robinson, Zoe Sheer, MD ?[address] [place] [address] ? ? ? ?I connected with Laurence Ferrari on 04/02/21 at 11:15 AM EDT by telephone and verified that I am speaking with the correct person using two identifiers. I discussed the limitations, risks, security and privacy concerns of performing an evaluation and management service by telephone and the availability of in person appointments. I also discussed with the patient that there may be a patient responsible charge related to this service. The patient expressed understanding and agreed to proceed.  ? ?Patient location: home ?Provider location:  [facility] office ?Others participating in call: none ? ? ?DIAGNOSIS: leukocytosis ? ?SUMMARY OF HEMATOLOGIC HISTORY: ? ?Referred for elevated WBC, noted below.  ? Latest Reference Range & Units 02/21/20 10:51 03/14/20 10:13 06/25/20 11:21 08/11/20 10:37 11/19/20 14:18 02/26/21 11:18 03/02/21 08:41  ?WBC 4.0 - 10.5 K/uL 15.8 (H) 15.2 (H) 16.4 (H) 12.1 (H) 17.3 (H) 15.4 (H) 13.3 (H)  ?(H): Data is abnormally high ? ?2. 03/14/2020: [employer] NGS JAK3, MPL, CALR, Reflex to MPN Panel report: Negative.  DNA analyzed in JAK2 V617F, JAK2 exon 12, MPL, CALR, ABL1, ASXL1, CALR, CBL, CSF3R, DNMT3A, EZH2, IDH1, IDH2, JAK2, KIT, MPL, SETBP1, SF3B1, SRSF2, TP53, U2AF1 ? ?3. 11/2020: elevated CRP and ESR, referred to rheumatology,  ? ?4.  Rheumatology evaluation: RF negative, anti-CCP negative, ANA negative, ACE negative.  Elbow pain attributed to lateral epicondylitis bilaterally.  Bilateral chronic knee pain attributed to chronic arthritis and questionable spurring.  Myalgias attributed to statin. ? ?5. 03/02/2021: Bone marrow biopsy and aspirate shows slightly hypercellular marrow at 60 to 70% cellularity with granulocytic hyperplasia.  No myeloid neoplasm was identified.  Cytogenetics were sent on the sample and were normal. ? ? ?CURRENT THERAPY: Observation ? ?INTERVAL  HISTORY: ?Zoe Woodard 41 y.o. female returns for follow-up to discuss her bone marrow biopsy results.  They are noted above.  She tells me that she is doing well since the biopsy.  She has had no new significant issues.  She is anxious to hear the results. ? ? ?Patient Active Problem List  ? Diagnosis Date Noted  ? Anemia in stage 4 chronic kidney disease ([place]) 04/02/2021  ? Bilateral chronic knee pain 10/31/2020  ? Bilateral elbow joint pain 10/31/2020  ? Hypertriglyceridemia 08/09/2020  ? Myalgia due to statin 08/06/2020  ? Bilateral headaches 07/08/2020  ? Adnexal cyst 04/15/2020  ? Atypical chest pain 03/13/2020  ? COVID-19 02/07/2020  ? Lower abdominal pain 12/28/2019  ? Leukocytosis 12/28/2019  ? 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 CKD (chronic kidney disease) stage 4, GFR 15-29 ml/min (HCC) 10/10/2019  ? Hyperlipidemia 08/22/2019  ? Lung nodule 08/22/2019  ? Breast mass 08/22/2019  ? Chronic cough 08/05/2019  ? Essential hypertension 07/22/2019  ? Healthcare maintenance 08/04/2016  ? ? ?has No Known Allergies. ? ?MEDICAL HISTORY: ?Past Medical History:  ?Diagnosis Date  ? Abdominal pain 12/28/2019  ? Back pain 10/10/2019  ? Chronic kidney disease   ? Ear pain, bilateral 04/15/2020  ? Hyperlipidemia   ? Hypertensive urgency 07/22/2019  ? Hypocalcemia 08/22/2019  ? Low blood pressure 02/07/2020  ? Obesity   ? Pyelonephritis 11/08/2019  ? ? ?SURGICAL HISTORY: ?Past Surgical History:  ?Procedure Laterality Date  ? ABDOMINAL HYSTERECTOMY    ? APPENDECTOMY    ? bladder laceration  01/04/2015  ? excision of thyroglobal duct cyst  02/01/2012  ? right ovarian cystoectomy  01/02/2015  ?  right ovarian egstectomy  04/27/2013  ? SPINE SURGERY    ? TONSILLECTOMY    ? TUBAL LIGATION    ? ? ?SOCIAL HISTORY: ?Social History  ? ?Socioeconomic History  ? Marital status: Single  ?  Spouse name: Not on file  ? Number of children: Not on file  ? Years of education: Not on file  ? Highest education level: Not on file   ?Occupational History  ? Not on file  ?Tobacco Use  ? Smoking status: Never  ? Smokeless tobacco: Never  ?Vaping Use  ? Vaping Use: Never used  ?Substance and Sexual Activity  ? Alcohol use: No  ? Drug use: No  ? Sexual activity: Not Currently  ?  Partners: Male  ?  Birth control/protection: Surgical  ?Other Topics Concern  ? Not on file  ?Social History Narrative  ? Lives with 3 children   ? Not working currently  ? Not sexually active.   ? ?Social Determinants of Health  ? ?Financial Resource Strain: Not on file  ?Food Insecurity: Not on file  ?Transportation Needs: Not on file  ?Physical Activity: Not on file  ?Stress: Not on file  ?Social Connections: Not on file  ?Intimate Partner Violence: Not on file  ? ? ?FAMILY HISTORY: ?Family History  ?Problem Relation Age of Onset  ? Diabetes Mother   ? Healthy Son   ? Healthy Daughter   ? Healthy Daughter   ? ? ?Review of Systems  ?Constitutional:  Negative for appetite change, chills, fatigue, fever and unexpected weight change.  ?HENT:   Negative for hearing loss, lump/mass and trouble swallowing.   ?Eyes:  Negative for eye problems and icterus.  ?Respiratory:  Negative for chest tightness, cough and shortness of breath.   ?Cardiovascular:  Negative for chest pain, leg swelling and palpitations.  ?Gastrointestinal:  Negative for abdominal distention, abdominal pain, constipation, diarrhea, nausea and vomiting.  ?Endocrine: Negative for hot flashes.  ?Genitourinary:  Negative for difficulty urinating.   ?Musculoskeletal:  Negative for arthralgias.  ?Skin:  Negative for itching and rash.  ?Neurological:  Negative for dizziness, extremity weakness, headaches and numbness.  ?Hematological:  Negative for adenopathy. Does not bruise/bleed easily.  ?Psychiatric/Behavioral:  Negative for depression. The patient is not nervous/anxious.    ? ? ?PHYSICAL EXAMINATION ? ?ECOG PERFORMANCE STATUS: 1 - Symptomatic but completely ambulatory ? ? ?Shows patient sounds well she is in no  apparent distress, mood and behavior normal, breathing is nonlabored, speech is normal. ? ? ?LABORATORY DATA: ? ?CBC ?   ?Component Value Date/Time  ? WBC 13.3 (H) 03/02/2021 0841  ? RBC 4.20 03/02/2021 0841  ? HGB 11.1 (L) 03/02/2021 0841  ? HGB 11.9 (L) 02/26/2021 1118  ? HCT 34.7 (L) 03/02/2021 0841  ? HCT 37.7 02/21/2020 1051  ? PLT 395 03/02/2021 0841  ? PLT 378 02/26/2021 1118  ? MCV 82.6 03/02/2021 0841  ? MCH 26.4 03/02/2021 0841  ? MCHC 32.0 03/02/2021 0841  ? RDW 13.9 03/02/2021 0841  ? LYMPHSABS 3.3 03/02/2021 0841  ? MONOABS 0.7 03/02/2021 0841  ? EOSABS 0.1 03/02/2021 0841  ? BASOSABS 0.1 03/02/2021 0841  ? ? ?CMP  ?   ?Component Value Date/Time  ? NA 138 03/02/2021 0841  ? NA 139 02/07/2020 1116  ? K 3.9 03/02/2021 0841  ? CL 110 03/02/2021 0841  ? CO2 18 (L) 03/02/2021 0841  ? GLUCOSE 108 (H) 03/02/2021 0841  ? BUN 39 (H) 03/02/2021 0841  ? BUN 27 (H)  02/07/2020 1116  ? CREATININE 2.70 (H) 03/02/2021 0841  ? CALCIUM 8.5 (L) 03/02/2021 0841  ? PROT 7.2 03/02/2021 0841  ? ALBUMIN 3.8 03/02/2021 0841  ? ALBUMIN 4.0 10/09/2019 1222  ? AST 20 03/02/2021 0841  ? ALT 46 (H) 03/02/2021 0841  ? ALKPHOS 164 (H) 03/02/2021 0841  ? BILITOT 0.4 03/02/2021 0841  ? GFRNONAA 22 (L) 03/02/2021 0841  ? GFRAA 31 (L) 02/07/2020 1116  ? ? ? ? ? ? ?ASSESSMENT and THERAPY PLAN:  ? ?Anemia in stage 4 chronic kidney disease ([place]) ?Her most recent hemoglobin was 11.9.  We will continue to monitor.  She is not at the point where she requires erythropoietin injections.  She continues to be followed by nephrology. ? ?Leukocytosis ?Leukocytosis is not related to a primary bone marrow abnormality.  We will monitor her every 6 months and then should everything remains stable can likely release her in about a year.  We will see her back in 6 months for labs and follow-up. ? ? ? ? ?All questions were answered. The patient knows to call the clinic with any problems, questions or concerns. We can certainly see the patient much sooner if  necessary. ? ?Follow up instructions:  ?  ?-Return to cancer center in 6 months ? ? ? ?The patient was provided an opportunity to ask questions and all were answered. The patient agreed with the plan and d

## 2021-04-02 NOTE — Assessment & Plan Note (Signed)
Her most recent hemoglobin was 11.9.  We will continue to monitor.  She is not at the point where she requires erythropoietin injections.  She continues to be followed by nephrology. ?

## 2021-04-02 NOTE — Assessment & Plan Note (Signed)
Leukocytosis is not related to a primary bone marrow abnormality.  We will monitor her every 6 months and then should everything remains stable can likely release her in about a year.  We will see her back in 6 months for labs and follow-up. ?

## 2021-04-08 ENCOUNTER — Other Ambulatory Visit: Payer: Self-pay | Admitting: Family Medicine

## 2021-04-08 DIAGNOSIS — R053 Chronic cough: Secondary | ICD-10-CM

## 2021-04-17 ENCOUNTER — Other Ambulatory Visit: Payer: Self-pay | Admitting: Family Medicine

## 2021-04-17 DIAGNOSIS — I10 Essential (primary) hypertension: Secondary | ICD-10-CM

## 2021-05-04 DIAGNOSIS — R053 Chronic cough: Secondary | ICD-10-CM | POA: Diagnosis not present

## 2021-05-04 DIAGNOSIS — N184 Chronic kidney disease, stage 4 (severe): Secondary | ICD-10-CM | POA: Diagnosis not present

## 2021-05-04 DIAGNOSIS — R252 Cramp and spasm: Secondary | ICD-10-CM | POA: Diagnosis not present

## 2021-05-04 DIAGNOSIS — I129 Hypertensive chronic kidney disease with stage 1 through stage 4 chronic kidney disease, or unspecified chronic kidney disease: Secondary | ICD-10-CM | POA: Diagnosis not present

## 2021-07-29 ENCOUNTER — Other Ambulatory Visit: Payer: Self-pay | Admitting: Family Medicine

## 2021-07-29 DIAGNOSIS — R053 Chronic cough: Secondary | ICD-10-CM

## 2021-08-02 ENCOUNTER — Other Ambulatory Visit: Payer: Self-pay | Admitting: Family Medicine

## 2021-08-02 DIAGNOSIS — R053 Chronic cough: Secondary | ICD-10-CM

## 2021-08-21 DIAGNOSIS — I129 Hypertensive chronic kidney disease with stage 1 through stage 4 chronic kidney disease, or unspecified chronic kidney disease: Secondary | ICD-10-CM | POA: Diagnosis not present

## 2021-08-21 DIAGNOSIS — N184 Chronic kidney disease, stage 4 (severe): Secondary | ICD-10-CM | POA: Diagnosis not present

## 2021-08-21 DIAGNOSIS — R252 Cramp and spasm: Secondary | ICD-10-CM | POA: Diagnosis not present

## 2021-08-21 DIAGNOSIS — R053 Chronic cough: Secondary | ICD-10-CM | POA: Diagnosis not present

## 2021-08-24 ENCOUNTER — Other Ambulatory Visit: Payer: Self-pay | Admitting: Family Medicine

## 2021-08-24 ENCOUNTER — Other Ambulatory Visit (HOSPITAL_COMMUNITY): Payer: Self-pay

## 2021-08-24 DIAGNOSIS — R053 Chronic cough: Secondary | ICD-10-CM

## 2021-08-25 ENCOUNTER — Telehealth: Payer: Self-pay

## 2021-08-25 NOTE — Telephone Encounter (Signed)
A Prior Authorization was initiated for this patients [place] through CoverMyMeds.   Key: [id number]

## 2021-08-27 ENCOUNTER — Inpatient Hospital Stay: Payer: Medicaid Other | Attending: Adult Health | Admitting: Adult Health

## 2021-08-27 ENCOUNTER — Encounter: Payer: Self-pay | Admitting: Adult Health

## 2021-08-27 ENCOUNTER — Inpatient Hospital Stay: Payer: Medicaid Other | Admitting: Adult Health

## 2021-08-27 ENCOUNTER — Other Ambulatory Visit (HOSPITAL_COMMUNITY): Payer: Self-pay

## 2021-08-27 ENCOUNTER — Other Ambulatory Visit: Payer: Self-pay

## 2021-08-27 VITALS — BP 115/75 | HR 81 | Temp 97.7°F | Resp 18 | Ht 63.0 in | Wt 191.5 lb

## 2021-08-27 DIAGNOSIS — R700 Elevated erythrocyte sedimentation rate: Secondary | ICD-10-CM | POA: Diagnosis not present

## 2021-08-27 DIAGNOSIS — D72828 Other elevated white blood cell count: Secondary | ICD-10-CM | POA: Diagnosis not present

## 2021-08-27 DIAGNOSIS — R5383 Other fatigue: Secondary | ICD-10-CM | POA: Diagnosis not present

## 2021-08-27 DIAGNOSIS — R7982 Elevated C-reactive protein (CRP): Secondary | ICD-10-CM | POA: Insufficient documentation

## 2021-08-27 DIAGNOSIS — D72829 Elevated white blood cell count, unspecified: Secondary | ICD-10-CM | POA: Diagnosis present

## 2021-08-27 LAB — CBC WITH DIFFERENTIAL (CANCER CENTER ONLY)
Abs Immature Granulocytes: 0.08 10*3/uL — ABNORMAL HIGH (ref 0.00–0.07)
Basophils Absolute: 0 10*3/uL (ref 0.0–0.1)
Basophils Relative: 0 %
Eosinophils Absolute: 0.1 10*3/uL (ref 0.0–0.5)
Eosinophils Relative: 1 %
HCT: 36.4 % (ref 36.0–46.0)
Hemoglobin: 12 g/dL (ref 12.0–15.0)
Immature Granulocytes: 1 %
Lymphocytes Relative: 27 %
Lymphs Abs: 3.5 10*3/uL (ref 0.7–4.0)
MCH: 27.4 pg (ref 26.0–34.0)
MCHC: 33 g/dL (ref 30.0–36.0)
MCV: 83.1 fL (ref 80.0–100.0)
Monocytes Absolute: 0.7 10*3/uL (ref 0.1–1.0)
Monocytes Relative: 5 %
Neutro Abs: 8.6 10*3/uL — ABNORMAL HIGH (ref 1.7–7.7)
Neutrophils Relative %: 66 %
Platelet Count: 368 10*3/uL (ref 150–400)
RBC: 4.38 MIL/uL (ref 3.87–5.11)
RDW: 13 % (ref 11.5–15.5)
WBC Count: 12.9 10*3/uL — ABNORMAL HIGH (ref 4.0–10.5)
nRBC: 0 % (ref 0.0–0.2)

## 2021-08-27 LAB — CMP (CANCER CENTER ONLY)
ALT: 25 U/L (ref 0–44)
AST: 17 U/L (ref 15–41)
Albumin: 3.9 g/dL (ref 3.5–5.0)
Alkaline Phosphatase: 137 U/L — ABNORMAL HIGH (ref 38–126)
Anion gap: 5 (ref 5–15)
BUN: 38 mg/dL — ABNORMAL HIGH (ref 6–20)
CO2: 23 mmol/L (ref 22–32)
Calcium: 8.7 mg/dL — ABNORMAL LOW (ref 8.9–10.3)
Chloride: 108 mmol/L (ref 98–111)
Creatinine: 2.35 mg/dL — ABNORMAL HIGH (ref 0.44–1.00)
GFR, Estimated: 26 mL/min — ABNORMAL LOW (ref >60)
Glucose, Bld: 90 mg/dL (ref 70–99)
Potassium: 4.5 mmol/L (ref 3.5–5.1)
Sodium: 136 mmol/L (ref 135–145)
Total Bilirubin: 0.3 mg/dL (ref 0.3–1.2)
Total Protein: 7.5 g/dL (ref 6.5–8.1)

## 2021-08-27 LAB — VITAMIN B12: Vitamin B-12: 753 pg/mL (ref 180–914)

## 2021-08-27 LAB — IRON AND IRON BINDING CAPACITY (CC-WL,HP ONLY)
Iron: 56 ug/dL (ref 28–170)
Saturation Ratios: 18 % (ref 10.4–31.8)
TIBC: 319 ug/dL (ref 250–450)
UIBC: 263 ug/dL (ref 148–442)

## 2021-08-27 LAB — VITAMIN D 25 HYDROXY (VIT D DEFICIENCY, FRACTURES): Vit D, 25-Hydroxy: 12.22 ng/mL — ABNORMAL LOW (ref 30–100)

## 2021-08-27 NOTE — Telephone Encounter (Signed)
Prior auth not needed, medication covered under managed medicaid plan.

## 2021-08-27 NOTE — Progress Notes (Signed)
[facility] Cancer Follow up:    [name], [name], MD [address] [place] [address]     DIAGNOSIS: leukocytosis  SUMMARY OF HEMATOLOGIC HISTORY:  Referred for elevated WBC, noted below.   Latest Reference Range & Units 02/21/20 10:51 03/14/20 10:13 06/25/20 11:21 08/11/20 10:37 11/19/20 14:18 02/26/21 11:18 03/02/21 08:41  WBC 4.0 - 10.5 K/uL 15.8 (H) 15.2 (H) 16.4 (H) 12.1 (H) 17.3 (H) 15.4 (H) 13.3 (H)  (H): Data is abnormally high  2. 03/14/2020: On[employer]ight NGS JAK3, MPL, CALR, Reflex to MPN Panel report: Negative.  DNA analyzed in JAK2 V617F, JAK2 exon 12, MPL, CALR, ABL1, ASXL1, CALR, CBL, CSF3R, DNMT3A, EZH2, IDH1, IDH2, JAK2, KIT, MPL, SETBP1, SF3B1, SRSF2, TP53, U2AF1  3. 11/2020: elevated CRP and ESR, referred to rheumatology,   4.  Rheumatology evaluation: RF negative, anti-CCP negative, ANA negative, ACE negative.  Elbow pain attributed to lateral epicondylitis bilaterally.  Bilateral chronic knee pain attributed to chronic arthritis and questionable spurring.  Myalgias attributed to statin.  5. 03/02/2021: Bone marrow biopsy and aspirate shows slightly hypercellular marrow at 60 to 70% cellularity with granulocytic hyperplasia.  No myeloid neoplasm was identified.  Cytogenetics were sent on the sample and were normal.   CURRENT THERAPY: Observation  INTERVAL HISTORY: Zoe Woodard 41 y.o. female returns for f/u and surveillance of her leukocytosis.  She is doing well today.  She is fatigued, but denies any lymphadenopathy, cough, chills, night sweats, unintentional weight loss, or other concerns.    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  MEDICAL HISTORY: Past Medical History:  Diagnosis Date   Abdominal pain 12/28/2019   Back pain 10/10/2019   Chronic kidney disease    Ear pain, bilateral 04/15/2020   Hyperlipidemia    Hypertensive urgency 07/22/2019   Hypocalcemia 08/22/2019   Low blood pressure 02/07/2020   Obesity    Pyelonephritis 11/08/2019    SURGICAL HISTORY: Past Surgical History:  Procedure Laterality Date   ABDOMINAL HYSTERECTOMY     APPENDECTOMY     bladder laceration  01/04/2015   excision of thyroglobal duct cyst  02/01/2012   right ovarian cystoectomy  01/02/2015   right ovarian egstectomy  04/27/2013   SPINE SURGERY     TONSILLECTOMY     TUBAL LIGATION      SOCIAL HISTORY: Social History   Socioeconomic History   Marital status: Single    Spouse name: Not on file   Number of children: Not on file   Years of education: Not on file   Highest education level: Not on file  Occupational History   Not on file  Tobacco Use   Smoking status: Never   Smokeless tobacco: Never  Vaping Use   Vaping Use: Never used  Substance and   Sexual Activity   Alcohol use: No   Drug use: No   Sexual activity: Not Currently    Partners: Male    Birth control/protection: Surgical  Other Topics Concern   Not on file  Social History Narrative   Lives with 3 children    Not working currently   Not sexually active.    Social Determinants of Health   Financial Resource Strain: Not on file  Food Insecurity: Not on file  Transportation Needs: Not on file  Physical Activity: Not on file   Stress: Not on file  Social Connections: Not on file  Intimate Partner Violence: Not on file    FAMILY HISTORY: Family History  Problem Relation Age of Onset   Diabetes Mother    Healthy Son    Healthy Daughter    Healthy Daughter     Review of Systems  Constitutional:  Positive for fatigue. Negative for appetite change, chills, fever and unexpected weight change.  HENT:   Negative for hearing loss, lump/mass and trouble swallowing.   Eyes:  Negative for eye problems and icterus.  Respiratory:  Negative for chest tightness, cough and shortness of breath.   Cardiovascular:  Negative for chest pain, leg swelling and palpitations.  Gastrointestinal:  Negative for abdominal distention, abdominal pain, constipation, diarrhea, nausea and vomiting.  Endocrine: Negative for hot flashes.  Genitourinary:  Negative for difficulty urinating.   Musculoskeletal:  Negative for arthralgias.  Skin:  Negative for itching and rash.  Neurological:  Negative for dizziness, extremity weakness, headaches and numbness.  Hematological:  Negative for adenopathy. Does not bruise/bleed easily.  Psychiatric/Behavioral:  Negative for depression. The patient is not nervous/anxious.       PHYSICAL EXAMINATION  ECOG PERFORMANCE STATUS: 1 - Symptomatic but completely ambulatory GENERAL: Patient is a well appearing female in no acute distress HEENT:  Sclerae anicteric.  Oropharynx clear and moist. No ulcerations or evidence of oropharyngeal candidiasis. Neck is supple.  NODES:  No cervical, supraclavicular, or axillary lymphadenopathy palpated.  BREAST EXAM:  Deferred. LUNGS:  Clear to auscultation bilaterally.  No wheezes or rhonchi. HEART:  Regular rate and rhythm. No murmur appreciated. ABDOMEN:  Soft, nontender.  Positive, normoactive bowel sounds. No organomegaly palpated. MSK:  No focal spinal tenderness to palpation. Full range of motion bilaterally in the upper extremities. EXTREMITIES:  No  peripheral edema.   SKIN:  Clear with no obvious rashes or skin changes. No nail dyscrasia. NEURO:  Nonfocal. Well oriented.  Appropriate affect.   LABORATORY DATA:  CBC    Component Value Date/Time   WBC 12.9 (H) 08/27/2021 1152   WBC 13.3 (H) 03/02/2021 0841   RBC 4.38 08/27/2021 1152   HGB 12.0 08/27/2021 1152   HCT 36.4 08/27/2021 1152   HCT 37.7 02/21/2020 1051   PLT 368 08/27/2021 1152   MCV 83.1 08/27/2021 1152   MCH 27.4 08/27/2021 1152   MCHC 33.0 08/27/2021 1152   RDW 13.0 08/27/2021 1152   LYMPHSABS 3.5 08/27/2021 1152   MONOABS 0.7 08/27/2021 1152   EOSABS 0.1 08/27/2021 1152   BASOSABS 0.0 08/27/2021 1152    CMP     Component Value Date/Time   NA 136 08/27/2021 1152   NA 139 02/07/2020 1116   K 4.5 08/27/2021 1152   CL 108 08/27/2021 1152   CO2 23 08/27/2021 1152   GLUCOSE 90 08/27/2021 1152   BUN 38 (H) 08/27/2021 1152   BUN 27 (H) 02/07/2020 1116   CREATININE 2.35 (H) 08/27/2021 1152  CALCIUM 8.7 (L) 08/27/2021 1152   PROT 7.5 08/27/2021 1152   ALBUMIN 3.9 08/27/2021 1152   ALBUMIN 4.0 10/09/2019 1222   AST 17 08/27/2021 1152   ALT 25 08/27/2021 1152   ALKPHOS 137 (H) 08/27/2021 1152   BILITOT 0.3 08/27/2021 1152   GFRNONAA 26 (L) 08/27/2021 1152   GFRAA 31 (L) 02/07/2020 1116        ASSESSMENT and THERAPY PLAN:   Leukocytosis Zoe Woodard is here today for f/u of her leukocytosis.  She has no clinical signs of progression to a hematologic malignancy.  She has not yet had labs at the time of her appointment completion.  I recommended she undergo lab testing and we will f/u with her about the results and next steps.    We will see her back in 6 months for labs and f/u.      All questions were answered. The patient knows to call the clinic with any problems, questions or concerns. We can certainly see the patient much sooner if necessary.  Follow up instructions:    -Return to cancer center in 6 months    The patient was provided an  opportunity to ask questions and all were answered. The patient agreed with the plan and demonstrated an understanding of the instructions.   Total encounter time:20 minutes*in face-to-face visit time, chart review, lab review, care coordination, order entry, and documentation of the encounter time.    Lindsey Causey, NP 08/28/21 12:08 AM Medical Oncology and Hematology [facility] [address] [place], [place] [address] Tel. [phone]    Fax. [phone]  *Total Encounter Time as defined by the Centers for Medicare and Medicaid Services includes, in addition to the face-to-face time of a patient visit (documented in the note above) non-face-to-face time: obtaining and reviewing outside history, ordering and reviewing medications, tests or procedures, care coordination (communications with other health care professionals or caregivers) and documentation in the medical record.  

## 2021-08-28 NOTE — Assessment & Plan Note (Signed)
Zoe Woodard is here today for f/u of her leukocytosis.  She has no clinical signs of progression to a hematologic malignancy.  She has not yet had labs at the time of her appointment completion.  I recommended she undergo lab testing and we will f/u with her about the results and next steps.    We will see her back in 6 months for labs and f/u.

## 2021-09-07 ENCOUNTER — Other Ambulatory Visit: Payer: Self-pay | Admitting: *Deleted

## 2021-09-07 MED ORDER — VITAMIN D (ERGOCALCIFEROL) 50000 UNITS PO CAPS: 50000.0000 [IU] | ORAL_CAPSULE | ORAL | 0 refills | Status: DC

## 2021-10-02 ENCOUNTER — Other Ambulatory Visit: Payer: Self-pay | Admitting: Family Medicine

## 2021-10-02 DIAGNOSIS — I10 Essential (primary) hypertension: Secondary | ICD-10-CM

## 2021-10-28 ENCOUNTER — Telehealth: Payer: Self-pay | Admitting: Adult Health

## 2021-10-29 ENCOUNTER — Other Ambulatory Visit: Payer: Self-pay | Admitting: Family Medicine

## 2021-10-29 DIAGNOSIS — Z1231 Encounter for screening mammogram for malignant neoplasm of breast: Secondary | ICD-10-CM

## 2021-10-30 ENCOUNTER — Other Ambulatory Visit: Payer: Medicaid Other

## 2021-10-30 ENCOUNTER — Other Ambulatory Visit: Payer: Self-pay | Admitting: *Deleted

## 2021-10-30 DIAGNOSIS — D649 Anemia, unspecified: Secondary | ICD-10-CM

## 2021-10-30 NOTE — Telephone Encounter (Signed)
Last level was on 08/27/2021 and the level was 12.2.  Wilber Bihari, NP refilled 1 month ago.  Will schedule lab for 11/12/2021 which is 12 weeks out from last lab. Will call patient to advise and refill Vitamin D.Daneshia Tavano Janetta Hora, RN

## 2021-11-03 ENCOUNTER — Ambulatory Visit (INDEPENDENT_AMBULATORY_CARE_PROVIDER_SITE_OTHER): Payer: Medicaid Other

## 2021-11-03 DIAGNOSIS — Z23 Encounter for immunization: Secondary | ICD-10-CM | POA: Diagnosis present

## 2021-11-03 NOTE — Progress Notes (Signed)
Patient presents to nurse clinic for flu vaccination. Administered in LD, site unremarkable, tolerated injection well.   Chanika Byland C Jeffren Dombek, RN   

## 2021-11-04 ENCOUNTER — Ambulatory Visit
Admission: RE | Admit: 2021-11-04 | Discharge: 2021-11-04 | Disposition: A | Discharge status: Home / Self Care | Payer: Medicaid Other | Source: Ambulatory Visit | Attending: Family Medicine | Admitting: Family Medicine

## 2021-11-04 DIAGNOSIS — N6313 Unspecified lump in the right breast, lower outer quadrant: Secondary | ICD-10-CM | POA: Diagnosis not present

## 2021-11-04 DIAGNOSIS — N6312 Unspecified lump in the right breast, upper inner quadrant: Secondary | ICD-10-CM | POA: Diagnosis not present

## 2021-11-04 DIAGNOSIS — Z1231 Encounter for screening mammogram for malignant neoplasm of breast: Secondary | ICD-10-CM

## 2021-11-04 DIAGNOSIS — N6311 Unspecified lump in the right breast, upper outer quadrant: Secondary | ICD-10-CM | POA: Diagnosis not present

## 2021-11-04 DIAGNOSIS — N6314 Unspecified lump in the right breast, lower inner quadrant: Secondary | ICD-10-CM | POA: Diagnosis not present

## 2021-11-04 DIAGNOSIS — Z0000 Encounter for general adult medical examination without abnormal findings: Secondary | ICD-10-CM

## 2021-11-04 DIAGNOSIS — R92333 Mammographic heterogeneous density, bilateral breasts: Secondary | ICD-10-CM | POA: Diagnosis not present

## 2021-11-05 ENCOUNTER — Encounter: Payer: Self-pay | Admitting: Family Medicine

## 2021-11-05 NOTE — Assessment & Plan Note (Signed)
11/04/2021 mammogram recommended mammogram in one year. Please refer to 'Breast Mass' problem.

## 2021-11-05 NOTE — Assessment & Plan Note (Signed)
Mammogram 11/04/2021:  Right breast: The 2 oval masses in the anterior aspect of the right breast are stable. No new suspicious mass, distortion, or microcalcifications are identified to suggest presence of Malignancy.  Screening mammogram in one year

## 2021-11-05 NOTE — Progress Notes (Signed)
Shows benign 2 breast masses in Right breast. Recommends mammogram in one year

## 2021-11-12 ENCOUNTER — Other Ambulatory Visit: Payer: Self-pay

## 2021-11-12 ENCOUNTER — Inpatient Hospital Stay: Payer: Medicaid Other | Attending: Adult Health

## 2021-11-12 DIAGNOSIS — R5383 Other fatigue: Secondary | ICD-10-CM | POA: Insufficient documentation

## 2021-11-12 DIAGNOSIS — D72829 Elevated white blood cell count, unspecified: Secondary | ICD-10-CM | POA: Insufficient documentation

## 2021-11-12 DIAGNOSIS — D649 Anemia, unspecified: Secondary | ICD-10-CM | POA: Diagnosis not present

## 2021-11-12 LAB — CBC WITH DIFFERENTIAL (CANCER CENTER ONLY)
Abs Immature Granulocytes: 0.08 10*3/uL — ABNORMAL HIGH (ref 0.00–0.07)
Basophils Absolute: 0.1 10*3/uL (ref 0.0–0.1)
Basophils Relative: 0 %
Eosinophils Absolute: 0.1 10*3/uL (ref 0.0–0.5)
Eosinophils Relative: 1 %
HCT: 36.6 % (ref 36.0–46.0)
Hemoglobin: 12.1 g/dL (ref 12.0–15.0)
Immature Granulocytes: 1 %
Lymphocytes Relative: 28 %
Lymphs Abs: 4.5 10*3/uL — ABNORMAL HIGH (ref 0.7–4.0)
MCH: 27.4 pg (ref 26.0–34.0)
MCHC: 33.1 g/dL (ref 30.0–36.0)
MCV: 82.8 fL (ref 80.0–100.0)
Monocytes Absolute: 0.7 10*3/uL (ref 0.1–1.0)
Monocytes Relative: 4 %
Neutro Abs: 11 10*3/uL — ABNORMAL HIGH (ref 1.7–7.7)
Neutrophils Relative %: 66 %
Platelet Count: 450 10*3/uL — ABNORMAL HIGH (ref 150–400)
RBC: 4.42 MIL/uL (ref 3.87–5.11)
RDW: 13.2 % (ref 11.5–15.5)
WBC Count: 16.4 10*3/uL — ABNORMAL HIGH (ref 4.0–10.5)
nRBC: 0 % (ref 0.0–0.2)

## 2021-11-12 LAB — CMP (CANCER CENTER ONLY)
ALT: 20 U/L (ref 0–44)
AST: 15 U/L (ref 15–41)
Albumin: 3.8 g/dL (ref 3.5–5.0)
Alkaline Phosphatase: 135 U/L — ABNORMAL HIGH (ref 38–126)
Anion gap: 8 (ref 5–15)
BUN: 31 mg/dL — ABNORMAL HIGH (ref 6–20)
CO2: 21 mmol/L — ABNORMAL LOW (ref 22–32)
Calcium: 8.7 mg/dL — ABNORMAL LOW (ref 8.9–10.3)
Chloride: 109 mmol/L (ref 98–111)
Creatinine: 2.49 mg/dL — ABNORMAL HIGH (ref 0.44–1.00)
GFR, Estimated: 24 mL/min — ABNORMAL LOW (ref >60)
Glucose, Bld: 124 mg/dL — ABNORMAL HIGH (ref 70–99)
Potassium: 4.3 mmol/L (ref 3.5–5.1)
Sodium: 138 mmol/L (ref 135–145)
Total Bilirubin: 0.4 mg/dL (ref 0.3–1.2)
Total Protein: 7.4 g/dL (ref 6.5–8.1)

## 2021-11-12 LAB — VITAMIN B12: Vitamin B-12: 1222 pg/mL — ABNORMAL HIGH (ref 180–914)

## 2021-11-12 LAB — VITAMIN D 25 HYDROXY (VIT D DEFICIENCY, FRACTURES): Vit D, 25-Hydroxy: 30.77 ng/mL (ref 30–100)

## 2021-11-16 ENCOUNTER — Telehealth: Payer: Self-pay | Admitting: *Deleted

## 2021-11-16 NOTE — Telephone Encounter (Signed)
-----   Message from Gardenia Phlegm, NP sent at 11/15/2021  7:15 PM EST ----- Please fax labs to her PCP ----- Message ----- From: Interface, Lab In [place] Sent: 11/12/2021  10:29 AM EST To: Gardenia Phlegm, NP

## 2021-11-16 NOTE — Telephone Encounter (Signed)
Per Wilber Bihari, DNP, labs routed to pt PCP Dr. Rexene Agent

## 2021-11-23 DIAGNOSIS — R252 Cramp and spasm: Secondary | ICD-10-CM | POA: Diagnosis not present

## 2021-11-23 DIAGNOSIS — I129 Hypertensive chronic kidney disease with stage 1 through stage 4 chronic kidney disease, or unspecified chronic kidney disease: Secondary | ICD-10-CM | POA: Diagnosis not present

## 2021-11-23 DIAGNOSIS — R053 Chronic cough: Secondary | ICD-10-CM | POA: Diagnosis not present

## 2021-11-23 DIAGNOSIS — N184 Chronic kidney disease, stage 4 (severe): Secondary | ICD-10-CM | POA: Diagnosis not present

## 2021-12-29 ENCOUNTER — Other Ambulatory Visit: Payer: Self-pay | Admitting: Family Medicine

## 2021-12-29 ENCOUNTER — Telehealth: Payer: Self-pay

## 2021-12-29 DIAGNOSIS — R053 Chronic cough: Secondary | ICD-10-CM

## 2021-12-29 NOTE — Telephone Encounter (Signed)
Pt called to find out if she should still be taking PO Vitamin D2 and b12 per labs 11/12/21. Labs were faxed to PCP 11/16/21 per NP and if needed, they will oversee care of PO meds. Attempted to call pt to make her aware of this. LVM for call back.

## 2022-01-13 ENCOUNTER — Telehealth: Payer: Self-pay

## 2022-01-13 DIAGNOSIS — E559 Vitamin D deficiency, unspecified: Secondary | ICD-10-CM

## 2022-01-13 NOTE — Telephone Encounter (Signed)
Patient calls nurse line in regards to PO Vitamin B12 and Vitamin D.  She reports recent lab work was obtained by oncology (11/12/2021) and she was told to reach out to PCP for refills.   She reports she buts PO B12 OTC, however will need a refill on Vitamin D if appropriate.   Will forward to PCP for review.

## 2022-01-14 MED ORDER — VITAMIN D (ERGOCALCIFEROL) 1.25 MG (50000 UNIT) PO CAPS: 50000.0000 [IU] | ORAL_CAPSULE | ORAL | 0 refills | Status: DC

## 2022-01-15 NOTE — Telephone Encounter (Signed)
Patient return call to nurse line.   Patient advised to stop Vitamin B12 at this time due to high levels.   Patient advised Vitamin D was sent into her pharmacy.

## 2022-01-15 NOTE — Telephone Encounter (Signed)
Attempted to call patient to discuss.   However, no answer. VM left to return my call to discuss.

## 2022-01-18 DIAGNOSIS — N184 Chronic kidney disease, stage 4 (severe): Secondary | ICD-10-CM | POA: Diagnosis not present

## 2022-01-19 ENCOUNTER — Other Ambulatory Visit: Payer: Self-pay | Admitting: Family Medicine

## 2022-01-19 DIAGNOSIS — I10 Essential (primary) hypertension: Secondary | ICD-10-CM

## 2022-01-19 DIAGNOSIS — E559 Vitamin D deficiency, unspecified: Secondary | ICD-10-CM

## 2022-02-25 ENCOUNTER — Other Ambulatory Visit: Payer: Self-pay | Admitting: *Deleted

## 2022-02-25 DIAGNOSIS — D649 Anemia, unspecified: Secondary | ICD-10-CM

## 2022-03-01 ENCOUNTER — Encounter: Payer: Self-pay | Admitting: Hematology and Oncology

## 2022-03-01 ENCOUNTER — Inpatient Hospital Stay (HOSPITAL_BASED_OUTPATIENT_CLINIC_OR_DEPARTMENT_OTHER): Payer: Medicaid Other | Admitting: Hematology and Oncology

## 2022-03-01 ENCOUNTER — Inpatient Hospital Stay: Payer: Medicaid Other | Attending: Hematology and Oncology

## 2022-03-01 ENCOUNTER — Other Ambulatory Visit: Payer: Self-pay

## 2022-03-01 VITALS — BP 117/89 | HR 71 | Temp 97.6°F | Resp 16 | Ht 63.0 in | Wt 186.4 lb

## 2022-03-01 DIAGNOSIS — D75839 Thrombocytosis, unspecified: Secondary | ICD-10-CM | POA: Diagnosis not present

## 2022-03-01 DIAGNOSIS — D649 Anemia, unspecified: Secondary | ICD-10-CM

## 2022-03-01 DIAGNOSIS — E538 Deficiency of other specified B group vitamins: Secondary | ICD-10-CM | POA: Insufficient documentation

## 2022-03-01 DIAGNOSIS — D72829 Elevated white blood cell count, unspecified: Secondary | ICD-10-CM | POA: Insufficient documentation

## 2022-03-01 LAB — CMP (CANCER CENTER ONLY)
ALT: 18 U/L (ref 0–44)
AST: 16 U/L (ref 15–41)
Albumin: 3.6 g/dL (ref 3.5–5.0)
Alkaline Phosphatase: 135 U/L — ABNORMAL HIGH (ref 38–126)
Anion gap: 8 (ref 5–15)
BUN: 28 mg/dL — ABNORMAL HIGH (ref 6–20)
CO2: 22 mmol/L (ref 22–32)
Calcium: 7.8 mg/dL — ABNORMAL LOW (ref 8.9–10.3)
Chloride: 107 mmol/L (ref 98–111)
Creatinine: 1.99 mg/dL — ABNORMAL HIGH (ref 0.44–1.00)
GFR, Estimated: 31 mL/min — ABNORMAL LOW (ref >60)
Glucose, Bld: 113 mg/dL — ABNORMAL HIGH (ref 70–99)
Potassium: 3.5 mmol/L (ref 3.5–5.1)
Sodium: 137 mmol/L (ref 135–145)
Total Bilirubin: 0.3 mg/dL (ref 0.3–1.2)
Total Protein: 6.6 g/dL (ref 6.5–8.1)

## 2022-03-01 LAB — CBC WITH DIFFERENTIAL (CANCER CENTER ONLY)
Abs Immature Granulocytes: 0.07 10*3/uL (ref 0.00–0.07)
Basophils Absolute: 0.1 10*3/uL (ref 0.0–0.1)
Basophils Relative: 0 %
Eosinophils Absolute: 0.1 10*3/uL (ref 0.0–0.5)
Eosinophils Relative: 1 %
HCT: 34.8 % — ABNORMAL LOW (ref 36.0–46.0)
Hemoglobin: 11.7 g/dL — ABNORMAL LOW (ref 12.0–15.0)
Immature Granulocytes: 1 %
Lymphocytes Relative: 25 %
Lymphs Abs: 3.7 10*3/uL (ref 0.7–4.0)
MCH: 27.1 pg (ref 26.0–34.0)
MCHC: 33.6 g/dL (ref 30.0–36.0)
MCV: 80.7 fL (ref 80.0–100.0)
Monocytes Absolute: 0.6 10*3/uL (ref 0.1–1.0)
Monocytes Relative: 4 %
Neutro Abs: 10.1 10*3/uL — ABNORMAL HIGH (ref 1.7–7.7)
Neutrophils Relative %: 69 %
Platelet Count: 415 10*3/uL — ABNORMAL HIGH (ref 150–400)
RBC: 4.31 MIL/uL (ref 3.87–5.11)
RDW: 13.2 % (ref 11.5–15.5)
WBC Count: 14.7 10*3/uL — ABNORMAL HIGH (ref 4.0–10.5)
nRBC: 0 % (ref 0.0–0.2)

## 2022-03-01 LAB — VITAMIN B12: Vitamin B-12: 547 pg/mL (ref 180–914)

## 2022-03-01 NOTE — Progress Notes (Signed)
Zoe Woodard  PROGRESS NOTE  Patient Care Team: Lowry Ram, MD as PCP - General (Family Medicine)  CHIEF COMPLAINTS/PURPOSE OF CONSULTATION:  Leukocytosis   SUMMARY OF HEMATOLOGIC HISTORY:   Referred for elevated WBC, noted below.    Latest Reference Range & Units 02/21/20 10:51 03/14/20 10:13 06/25/20 11:21 08/11/20 10:37 11/19/20 14:18 02/26/21 11:18 03/02/21 08:41  WBC 4.0 - 10.5 K/uL 15.8 (H) 15.2 (H) 16.4 (H) 12.1 (H) 17.3 (H) 15.4 (H) 13.3 (H)  (H): Data is abnormally high   2. 03/14/2020: [employer] NGS JAK3, MPL, CALR, Reflex to MPN Panel report: Negative.  DNA analyzed in JAK2 V617F, JAK2 exon 12, MPL, CALR, ABL1, ASXL1, CALR, CBL, CSF3R, DNMT3A, EZH2, IDH1, IDH2, JAK2, KIT, MPL, SETBP1, SF3B1, SRSF2, TP53, U2AF1   3. 11/2020: elevated CRP and ESR, referred to rheumatology,    4.  Rheumatology evaluation: RF negative, anti-CCP negative, ANA negative, ACE negative.  Elbow pain attributed to lateral epicondylitis bilaterally.  Bilateral chronic knee pain attributed to chronic arthritis and questionable spurring.  Myalgias attributed to statin.   5. 03/02/2021: Bone marrow biopsy and aspirate shows slightly hypercellular marrow at 60 to 70% cellularity with granulocytic hyperplasia.  No myeloid neoplasm was identified.  Cytogenetics were sent on the sample and were normal.    INTERVAL HISTORY:   Zoe Woodard 42 y.o. female returns for follow-up for leukocytosis.  Zoe Woodard is here for follow-up.  Since her last visit here, she continues to do well.  She tells me that the joint pains have now resolved.  She denies any fevers, drenching night sweats, loss of appetite or loss of weight.  No change in bowel habits or urinary habits.  No new neurological complaints.   Rest of the pertinent 10 point ROS reviewed and negative.    MEDICAL HISTORY:  Past Medical History:  Diagnosis Date   Abdominal pain 12/28/2019   Back pain 10/10/2019   Chronic kidney disease     Ear pain, bilateral 04/15/2020   Hyperlipidemia    Hypertensive urgency 07/22/2019   Hypocalcemia 08/22/2019   Low blood pressure 02/07/2020   Obesity    Pyelonephritis 11/08/2019    SURGICAL HISTORY: Past Surgical History:  Procedure Laterality Date   ABDOMINAL HYSTERECTOMY     APPENDECTOMY     bladder laceration  01/04/2015   excision of thyroglobal duct cyst  02/01/2012   right ovarian cystoectomy  01/02/2015   right ovarian egstectomy  04/27/2013   SPINE SURGERY     TONSILLECTOMY     TUBAL LIGATION      SOCIAL HISTORY: Social History   Socioeconomic History   Marital status: Single    Spouse name: Not on file   Number of children: Not on file   Years of education: Not on file   Highest education level: Not on file  Occupational History   Not on file  Tobacco Use   Smoking status: Never   Smokeless tobacco: Never  Vaping Use   Vaping Use: Never used  Substance and Sexual Activity   Alcohol use: No   Drug use: No   Sexual activity: Not Currently    Partners: Male    Birth control/protection: Surgical  Other Topics Concern   Not on file  Social History Narrative   Lives with 3 children    Not working currently   Not sexually active.    Social Determinants of Health   Financial Resource Strain: Not on file  Food Insecurity: Not on file  Transportation Needs: Not on file  Physical Activity: Not on file  Stress: Not on file  Social Connections: Not on file  Intimate Partner Violence: Not on file    FAMILY HISTORY: Family History  Problem Relation Age of Onset   Diabetes Mother    Healthy Son    Healthy Daughter    Healthy Daughter     ALLERGIES:  has No Known Allergies.  MEDICATIONS:  Current Outpatient Medications  Medication Sig Dispense Refill   benzonatate (TESSALON) 100 MG capsule Take 1 capsule (100 mg total) by mouth 2 (two) times Woodard as needed. 180 capsule 2   diclofenac Sodium (VOLTAREN) 1 % GEL Apply 4 g topically 4 (four) times  Woodard. 150 g 6   famotidine (PEPCID) 20 MG tablet TAKE 1 TABLET BY MOUTH TWICE A DAY 180 tablet 1   loratadine (CLARITIN) 10 MG tablet TAKE 1 TABLET BY MOUTH EVERY DAY 30 tablet 11   losartan (COZAAR) 50 MG tablet Take 50 mg by mouth Woodard.     metoprolol tartrate (LOPRESSOR) 25 MG tablet TAKE 1 TABLET BY MOUTH TWICE A DAY 180 tablet 1   montelukast (SINGULAIR) 10 MG tablet TAKE 1 TABLET BY MOUTH EVERYDAY AT BEDTIME 90 tablet 1   vitamin B-12 (CYANOCOBALAMIN) 1000 MCG tablet Take 1,000 mcg by mouth Woodard.     Vitamin D, Ergocalciferol, (DRISDOL) 1.25 MG (50000 UNIT) CAPS capsule Take 1 capsule (50,000 Units total) by mouth once a week. 8 capsule 0   No current facility-administered medications for this visit.     PHYSICAL EXAMINATION: ECOG PERFORMANCE STATUS: 0 - Asymptomatic  Vitals:   03/01/22 1119  BP: 117/89  Pulse: 71  Resp: 16  Temp: 97.6 F (36.4 C)  SpO2: 100%    Filed Weights   03/01/22 1119  Weight: 186 lb 6.4 oz (84.6 kg)   Physical Exam Constitutional:      Appearance: Normal appearance. She is not ill-appearing.  HENT:     Head: Normocephalic and atraumatic.     Mouth/Throat:     Mouth: Mucous membranes are moist.     Pharynx: Oropharynx is clear.  Eyes:     General: No scleral icterus.    Extraocular Movements: Extraocular movements intact.  Cardiovascular:     Rate and Rhythm: Normal rate and regular rhythm.  Pulmonary:     Effort: Pulmonary effort is normal.     Breath sounds: Normal breath sounds.  Abdominal:     General: Abdomen is flat. Bowel sounds are normal. There is no distension.     Tenderness: There is no abdominal tenderness.  Musculoskeletal:        General: No swelling or tenderness.     Cervical back: Normal range of motion and neck supple.     Right lower leg: No edema.  Skin:    General: Skin is warm and dry.     Coloration: Skin is not jaundiced.  Neurological:     General: No focal deficit present.     Mental Status: She is  alert and oriented to person, place, and time.  Psychiatric:        Mood and Affect: Mood normal.      LABORATORY DATA:  I have reviewed the data as listed Lab Results  Component Value Date   WBC 14.7 (H) 03/01/2022   HGB 11.7 (L) 03/01/2022   HCT 34.8 (L) 03/01/2022   MCV 80.7 03/01/2022   PLT 415 (H) 03/01/2022     Chemistry  Component Value Date/Time   NA 138 11/12/2021 1013   NA 139 02/07/2020 1116   K 4.3 11/12/2021 1013   CL 109 11/12/2021 1013   CO2 21 (L) 11/12/2021 1013   BUN 31 (H) 11/12/2021 1013   BUN 27 (H) 02/07/2020 1116   CREATININE 2.49 (H) 11/12/2021 1013      Component Value Date/Time   CALCIUM 8.7 (L) 11/12/2021 1013   ALKPHOS 135 (H) 11/12/2021 1013   AST 15 11/12/2021 1013   ALT 20 11/12/2021 1013   BILITOT 0.4 11/12/2021 1013     I have reviewed pertinent labs from last visit.  We will repeat CBC, CMP and flow today.  RADIOGRAPHIC STUDIES: I have personally reviewed the radiological images as listed and agreed with the findings in the report. No results found.   ASSESSMENT & PLAN:   Zoe Woodard is a 42 y.o. female who presents to the clinic for evaluation for leukocytosis.  1.  Leukocytosis, neutrophil predominant: --Workup so far has included MPN panel and BCR/ABL FISH that was negative.  -- She has followed up with rheumatology, negative work-up so far.   --- Bone marrow biopsy with no clear explanation for the leukocytosis and thrombocytosis. --She remains asymptomatic.  At this time since she has persistent leukocytosis and thrombocytosis, I recommended that she continue annual follow-up with [place].  She was however encouraged to contact us sooner with any new questions or concerns.  We have briefly discussed about symptoms of concern and she expressed understanding. She is agreeable to all the recommendations.  2.  B12 deficiency: -- Repeat B12 lab today  3.  Normocytic anemia, mild: -- Stable overall, no indication for  transfusion.   All questions were answered. The patient knows to call the clinic with any problems, questions or concerns.  I have spent a total of 30 minutes minutes of face-to-face and non-face-to-face time, preparing to see the patient, obtaining and/or reviewing separately obtained history, performing a medically appropriate examination, counseling and educating the patient, ordering tests, referring and communicating with other health care professionals, documenting clinical information in the electronic health record, independently interpreting results and communicating results to the patient, and care coordination.   Benay Pike Hematology and Oncology [facility] [place] P: [phone]

## 2022-03-03 LAB — MISC LABCORP TEST (SEND OUT): Labcorp test code: 81950

## 2022-03-05 DIAGNOSIS — R053 Chronic cough: Secondary | ICD-10-CM | POA: Diagnosis not present

## 2022-03-05 DIAGNOSIS — N184 Chronic kidney disease, stage 4 (severe): Secondary | ICD-10-CM | POA: Diagnosis not present

## 2022-03-05 DIAGNOSIS — I129 Hypertensive chronic kidney disease with stage 1 through stage 4 chronic kidney disease, or unspecified chronic kidney disease: Secondary | ICD-10-CM | POA: Diagnosis not present

## 2022-03-05 DIAGNOSIS — R252 Cramp and spasm: Secondary | ICD-10-CM | POA: Diagnosis not present

## 2022-03-07 LAB — LAB REPORT - SCANNED
Creatinine, POC: 81.6 mg/dL
Protein/Creatinine Ratio: 1480
Total Protein, Urine: 120.8

## 2022-03-12 ENCOUNTER — Other Ambulatory Visit: Payer: Self-pay | Admitting: Family Medicine

## 2022-03-12 DIAGNOSIS — E559 Vitamin D deficiency, unspecified: Secondary | ICD-10-CM

## 2022-05-04 ENCOUNTER — Other Ambulatory Visit: Payer: Self-pay | Admitting: Family Medicine

## 2022-05-04 DIAGNOSIS — E559 Vitamin D deficiency, unspecified: Secondary | ICD-10-CM

## 2022-05-26 ENCOUNTER — Other Ambulatory Visit: Payer: Self-pay | Admitting: Family Medicine

## 2022-05-26 DIAGNOSIS — E559 Vitamin D deficiency, unspecified: Secondary | ICD-10-CM

## 2022-06-29 ENCOUNTER — Other Ambulatory Visit: Payer: Self-pay | Admitting: Family Medicine

## 2022-06-29 DIAGNOSIS — R053 Chronic cough: Secondary | ICD-10-CM

## 2022-08-11 DIAGNOSIS — R053 Chronic cough: Secondary | ICD-10-CM | POA: Diagnosis not present

## 2022-08-11 DIAGNOSIS — N184 Chronic kidney disease, stage 4 (severe): Secondary | ICD-10-CM | POA: Diagnosis not present

## 2022-08-11 DIAGNOSIS — I129 Hypertensive chronic kidney disease with stage 1 through stage 4 chronic kidney disease, or unspecified chronic kidney disease: Secondary | ICD-10-CM | POA: Diagnosis not present

## 2022-08-11 DIAGNOSIS — R252 Cramp and spasm: Secondary | ICD-10-CM | POA: Diagnosis not present

## 2022-08-17 ENCOUNTER — Other Ambulatory Visit: Payer: Self-pay | Admitting: Family Medicine

## 2022-08-17 DIAGNOSIS — R053 Chronic cough: Secondary | ICD-10-CM

## 2022-08-18 ENCOUNTER — Other Ambulatory Visit: Payer: Self-pay | Admitting: *Deleted

## 2022-08-18 DIAGNOSIS — R053 Chronic cough: Secondary | ICD-10-CM

## 2022-08-18 MED ORDER — LORATADINE 10 MG PO TABS
10.0000 mg | ORAL_TABLET | Freq: Every day | ORAL | 11 refills | Status: DC
Start: 2022-08-18 — End: 2022-11-11

## 2022-09-28 ENCOUNTER — Ambulatory Visit (INDEPENDENT_AMBULATORY_CARE_PROVIDER_SITE_OTHER): Payer: Medicaid Other

## 2022-09-28 DIAGNOSIS — Z23 Encounter for immunization: Secondary | ICD-10-CM

## 2022-09-28 NOTE — Progress Notes (Signed)
Flu Vaccine administered without complications.  See admin for details.

## 2022-10-09 ENCOUNTER — Other Ambulatory Visit: Payer: Self-pay | Admitting: Family Medicine

## 2022-10-09 DIAGNOSIS — I10 Essential (primary) hypertension: Secondary | ICD-10-CM

## 2022-10-21 ENCOUNTER — Encounter: Payer: Medicaid Other | Admitting: Family Medicine

## 2022-10-23 ENCOUNTER — Emergency Department (HOSPITAL_COMMUNITY)
Admission: EM | Admit: 2022-10-23 | Discharge: 2022-10-24 | Disposition: A | Discharge status: Home / Self Care | Payer: Medicaid Other | Attending: Emergency Medicine | Admitting: Emergency Medicine

## 2022-10-23 ENCOUNTER — Encounter (HOSPITAL_COMMUNITY): Payer: Self-pay | Admitting: *Deleted

## 2022-10-23 ENCOUNTER — Other Ambulatory Visit: Payer: Self-pay

## 2022-10-23 DIAGNOSIS — D72829 Elevated white blood cell count, unspecified: Secondary | ICD-10-CM | POA: Diagnosis not present

## 2022-10-23 DIAGNOSIS — R7989 Other specified abnormal findings of blood chemistry: Secondary | ICD-10-CM | POA: Diagnosis not present

## 2022-10-23 DIAGNOSIS — I129 Hypertensive chronic kidney disease with stage 1 through stage 4 chronic kidney disease, or unspecified chronic kidney disease: Secondary | ICD-10-CM | POA: Insufficient documentation

## 2022-10-23 DIAGNOSIS — R0602 Shortness of breath: Secondary | ICD-10-CM | POA: Diagnosis not present

## 2022-10-23 DIAGNOSIS — N189 Chronic kidney disease, unspecified: Secondary | ICD-10-CM | POA: Diagnosis not present

## 2022-10-23 DIAGNOSIS — R1012 Left upper quadrant pain: Secondary | ICD-10-CM | POA: Diagnosis not present

## 2022-10-23 LAB — URINALYSIS, ROUTINE W REFLEX MICROSCOPIC
Bilirubin Urine: NEGATIVE
Glucose, UA: NEGATIVE mg/dL
Hgb urine dipstick: NEGATIVE
Ketones, ur: NEGATIVE mg/dL
Leukocytes,Ua: NEGATIVE
Nitrite: NEGATIVE
Protein, ur: 100 mg/dL — AB
Specific Gravity, Urine: 1.009 (ref 1.005–1.030)
pH: 5 (ref 5.0–8.0)

## 2022-10-23 MED ORDER — OXYCODONE-ACETAMINOPHEN 5-325 MG PO TABS
1.0000 | ORAL_TABLET | Freq: Once | ORAL | Status: AC
  Administered 2022-10-23: 1 via ORAL
  Filled 2022-10-23: qty 1

## 2022-10-23 NOTE — ED Triage Notes (Signed)
The pt is c/o lt upper quadrant or chest pain since yesterday no urinary problems no n or v

## 2022-10-24 ENCOUNTER — Emergency Department (HOSPITAL_COMMUNITY): Payer: Medicaid Other

## 2022-10-24 LAB — COMPREHENSIVE METABOLIC PANEL
ALT: 15 U/L (ref 0–44)
AST: 18 U/L (ref 15–41)
Albumin: 3.3 g/dL — ABNORMAL LOW (ref 3.5–5.0)
Alkaline Phosphatase: 124 U/L (ref 38–126)
Anion gap: 12 (ref 5–15)
BUN: 36 mg/dL — ABNORMAL HIGH (ref 6–20)
CO2: 19 mmol/L — ABNORMAL LOW (ref 22–32)
Calcium: 8.4 mg/dL — ABNORMAL LOW (ref 8.9–10.3)
Chloride: 106 mmol/L (ref 98–111)
Creatinine, Ser: 2.56 mg/dL — ABNORMAL HIGH (ref 0.44–1.00)
GFR, Estimated: 23 mL/min — ABNORMAL LOW (ref >60)
Glucose, Bld: 108 mg/dL — ABNORMAL HIGH (ref 70–99)
Potassium: 4.2 mmol/L (ref 3.5–5.1)
Sodium: 137 mmol/L (ref 135–145)
Total Bilirubin: 0.4 mg/dL (ref 0.3–1.2)
Total Protein: 6.9 g/dL (ref 6.5–8.1)

## 2022-10-24 LAB — CBC
HCT: 35.5 % — ABNORMAL LOW (ref 36.0–46.0)
Hemoglobin: 11.1 g/dL — ABNORMAL LOW (ref 12.0–15.0)
MCH: 25.8 pg — ABNORMAL LOW (ref 26.0–34.0)
MCHC: 31.3 g/dL (ref 30.0–36.0)
MCV: 82.4 fL (ref 80.0–100.0)
Platelets: 433 10*3/uL — ABNORMAL HIGH (ref 150–400)
RBC: 4.31 MIL/uL (ref 3.87–5.11)
RDW: 14.2 % (ref 11.5–15.5)
WBC: 17.5 10*3/uL — ABNORMAL HIGH (ref 4.0–10.5)
nRBC: 0 % (ref 0.0–0.2)

## 2022-10-24 LAB — LIPASE, BLOOD: Lipase: 68 U/L — ABNORMAL HIGH (ref 11–51)

## 2022-10-24 LAB — TROPONIN I (HIGH SENSITIVITY)
Troponin I (High Sensitivity): 5 ng/L (ref <18)
Troponin I (High Sensitivity): 5 ng/L (ref <18)

## 2022-10-24 MED ORDER — ALUM & MAG HYDROXIDE-SIMETH 200-200-20 MG/5ML PO SUSP
30.0000 mL | Freq: Once | ORAL | Status: AC
  Administered 2022-10-24: 30 mL via ORAL
  Filled 2022-10-24: qty 30

## 2022-10-24 MED ORDER — LIDOCAINE VISCOUS HCL 2 % MT SOLN
15.0000 mL | Freq: Once | OROMUCOSAL | Status: AC
  Administered 2022-10-24: 15 mL via ORAL
  Filled 2022-10-24: qty 15

## 2022-10-24 MED ORDER — OMEPRAZOLE 20 MG PO CPDR
20.0000 mg | DELAYED_RELEASE_CAPSULE | Freq: Every day | ORAL | 0 refills | Status: DC
Start: 2022-10-24 — End: 2023-01-14

## 2022-10-24 MED ORDER — MORPHINE SULFATE (PF) 4 MG/ML IV SOLN
4.0000 mg | Freq: Once | INTRAVENOUS | Status: AC
  Administered 2022-10-24: 4 mg via INTRAVENOUS
  Filled 2022-10-24: qty 1

## 2022-10-24 MED ORDER — ONDANSETRON HCL 4 MG/2ML IJ SOLN
4.0000 mg | Freq: Once | INTRAMUSCULAR | Status: AC
  Administered 2022-10-24: 4 mg via INTRAVENOUS
  Filled 2022-10-24: qty 2

## 2022-10-24 NOTE — ED Notes (Signed)
Pt to X Ray.

## 2022-10-24 NOTE — ED Provider Notes (Signed)
[place] EMERGENCY DEPARTMENT AT [facility] Provider Note   CSN: [id number] Arrival date & time: 10/23/22  2244     History  Chief Complaint  Patient presents with   Abdominal Pain    Zoe Woodard is a 42 y.o. female.  42 year old female with complaint of LUQ abdominal pain, onset Friday, intermittent, radiates around to left flank. Nothing makes pain better or worse including taking a deep breath, eating, movement, exertion. With nausea, denies vomiting, fevers, chills, changes in bowel or bladder habits, SHOB. No other complaints or concerns.        Home Medications Prior to Admission medications   Medication Sig Start Date End Date Taking? Authorizing Provider  omeprazole (PRILOSEC) 20 MG capsule Take 1 capsule (20 mg total) by mouth daily. 10/24/22  Yes Jeannie Fend, PA-C  benzonatate (TESSALON) 100 MG capsule Take 1 capsule (100 mg total) by mouth 2 (two) times daily as needed. 10/03/20   Simmons-Robinson, Tawanna Cooler, MD  diclofenac Sodium (VOLTAREN) 1 % GEL Apply 4 g topically 4 (four) times daily. 11/21/20   Simmons-Robinson, Tawanna Cooler, MD  famotidine (PEPCID) 20 MG tablet TAKE 1 TABLET BY MOUTH TWICE A DAY 06/29/22   Lockie Mola, MD  loratadine (CLARITIN) 10 MG tablet Take 1 tablet (10 mg total) by mouth daily. 08/18/22   Lockie Mola, MD  losartan (COZAAR) 50 MG tablet Take 50 mg by mouth daily. 09/10/20   [provider]  metoprolol tartrate (LOPRESSOR) 25 MG tablet TAKE 1 TABLET BY MOUTH TWICE A DAY 10/12/22 01/10/23  Lockie Mola, MD  montelukast (SINGULAIR) 10 MG tablet TAKE 1 TABLET BY MOUTH EVERYDAY AT BEDTIME 08/18/22   Lockie Mola, MD  Vitamin D, Ergocalciferol, (DRISDOL) 1.25 MG (50000 UNIT) CAPS capsule TAKE 1 CAPSULE BY MOUTH ONE TIME PER WEEK 05/26/22   Lockie Mola, MD      Allergies    Patient has no known allergies.    Review of Systems   Review of Systems Negative except as per HPI Physical Exam Updated Vital Signs BP  123/84   Pulse 88   Temp 98.1 F (36.7 C) (Oral)   Resp 14   Ht 5\' 3"  (1.6 m)   Wt 84.6 kg   SpO2 100%   BMI 33.04 kg/m  Physical Exam Vitals and nursing note reviewed.  Constitutional:      General: She is not in acute distress.    Appearance: She is well-developed. She is not diaphoretic.  HENT:     Head: Normocephalic and atraumatic.  Cardiovascular:     Rate and Rhythm: Normal rate and regular rhythm.     Pulses: Normal pulses.     Heart sounds: Normal heart sounds.  Pulmonary:     Effort: Pulmonary effort is normal.     Breath sounds: Normal breath sounds.  Abdominal:     Palpations: Abdomen is soft.     Tenderness: There is abdominal tenderness in the left upper quadrant.  Musculoskeletal:     Right lower leg: No edema.     Left lower leg: No edema.  Skin:    General: Skin is warm and dry.     Findings: No erythema or rash.  Neurological:     Mental Status: She is alert and oriented to person, place, and time.  Psychiatric:        Behavior: Behavior normal.     ED Results / Procedures / Treatments   Labs (all labs ordered are listed, but only  abnormal results are displayed) Labs Reviewed  LIPASE, BLOOD - Abnormal; Notable for the following components:      Result Value   Lipase 68 (*)    All other components within normal limits  COMPREHENSIVE METABOLIC PANEL - Abnormal; Notable for the following components:   CO2 19 (*)    Glucose, Bld 108 (*)    BUN 36 (*)    Creatinine, Ser 2.56 (*)    Calcium 8.4 (*)    Albumin 3.3 (*)    GFR, Estimated 23 (*)    All other components within normal limits  CBC - Abnormal; Notable for the following components:   WBC 17.5 (*)    Hemoglobin 11.1 (*)    HCT 35.5 (*)    MCH 25.8 (*)    Platelets 433 (*)    All other components within normal limits  URINALYSIS, ROUTINE W REFLEX MICROSCOPIC - Abnormal; Notable for the following components:   Color, Urine STRAW (*)    APPearance HAZY (*)    Protein, ur 100 (*)     Bacteria, UA RARE (*)    All other components within normal limits  TROPONIN I (HIGH SENSITIVITY)  TROPONIN I (HIGH SENSITIVITY)    EKG EKG Interpretation Date/Time:  Sunday October 24 2022 04:27:37 EDT Ventricular Rate:  91 PR Interval:  134 QRS Duration:  79 QT Interval:  341 QTC Calculation: 420 R Axis:   38  Text Interpretation: Sinus rhythm No significant change was found Confirmed by Glynn Octave [phone]) on 10/24/2022 5:05:07 AM  Radiology CT ABDOMEN PELVIS WO CONTRAST  Result Date: 10/24/2022 CLINICAL DATA:  42 year old female with history of left upper quadrant abdominal pain since yesterday. EXAM: CT ABDOMEN AND PELVIS WITHOUT CONTRAST TECHNIQUE: Multidetector CT imaging of the abdomen and pelvis was performed following the standard protocol without IV contrast. RADIATION DOSE REDUCTION: This exam was performed according to the departmental dose-optimization program which includes automated exposure control, adjustment of the mA and/or kV according to patient size and/or use of iterative reconstruction technique. COMPARISON:  No priors. FINDINGS: Lower chest: Unremarkable. Hepatobiliary: No definite suspicious cystic or solid hepatic lesions are confidently identified on today's noncontrast CT examination. Unenhanced appearance of the gallbladder is unremarkable. Pancreas: No definite pancreatic mass or peripancreatic fluid collections or inflammatory changes are noted on today's noncontrast CT examination. Spleen: Unremarkable. Adrenals/Urinary Tract: There are no abnormal calcifications within the collecting system of either kidney, along the course of either ureter, or within the lumen of the urinary bladder. No hydroureteronephrosis or perinephric stranding to suggest urinary tract obstruction at this time. The unenhanced appearance of the kidneys is unremarkable bilaterally. Unenhanced appearance of the urinary bladder is normal. Bilateral adrenal glands are normal in  appearance. Stomach/Bowel: Unenhanced appearance of the stomach is normal. No pathologic dilatation of small bowel or colon. Numerous colonic diverticula are noted, without surrounding inflammatory changes to suggest an acute diverticulitis at this time. Normal appendix. Vascular/Lymphatic: No atherosclerotic calcifications are noted in the abdominal aorta or pelvic vasculature. No lymphadenopathy noted in the abdomen or pelvis. Reproductive: Status post hysterectomy. Left ovary is unremarkable in appearance. Low-attenuation lesions in the right ovary measuring up to 2.9 cm in diameter, simple in appearance on today's noncontrast CT examination, most compatible with follicles (no imaging follow-up recommended). Other: No significant volume of ascites.  No pneumoperitoneum. Musculoskeletal: There are no aggressive appearing lytic or blastic lesions noted in the visualized portions of the skeleton. IMPRESSION: 1. No acute findings are noted in the abdomen  or pelvis to account for the patient's symptoms. 2. Mild colonic diverticulosis without evidence of acute diverticulitis at this time. 3. Additional incidental findings and postoperative changes, as above. Electronically Signed   By: Trudie Reed M.D.   On: 10/24/2022 06:55   DG Chest 2 View  Result Date: 10/24/2022 CLINICAL DATA:  42 year old female with history of left upper quadrant abdominal pain. EXAM: CHEST - 2 VIEW COMPARISON:  No priors. FINDINGS: Lung volumes are normal. No consolidative airspace disease. No pleural effusions. No pneumothorax. No pulmonary nodule or mass noted. Pulmonary vasculature and the cardiomediastinal silhouette are within normal limits. IMPRESSION: No radiographic evidence of acute cardiopulmonary disease. Electronically Signed   By: Trudie Reed M.D.   On: 10/24/2022 05:09    Procedures Procedures    Medications Ordered in ED Medications  oxyCODONE-acetaminophen (PERCOCET/ROXICET) 5-325 MG per tablet 1 tablet (1  tablet Oral Given 10/23/22 2352)  ondansetron (ZOFRAN) injection 4 mg (4 mg Intravenous Given 10/24/22 0619)  morphine (PF) 4 MG/ML injection 4 mg (4 mg Intravenous Given 10/24/22 0620)  alum & mag hydroxide-simeth (MAALOX/MYLANTA) 200-200-20 MG/5ML suspension 30 mL (30 mLs Oral Given 10/24/22 0724)    And  lidocaine (XYLOCAINE) 2 % viscous mouth solution 15 mL (15 mLs Oral Given 10/24/22 7829)    ED Course/ Medical Decision Making/ A&P                                 Medical Decision Making Amount and/or Complexity of Data Reviewed Radiology: ordered.  Risk Prescription drug management.   This patient presents to the ED for concern of LUQ abdominal pain, this involves an extensive number of treatment options, and is a complaint that carries with it a high risk of complications and morbidity.  The differential diagnosis includes but not limited to gastritis, ACS, PUD   Co morbidities that complicate the patient evaluation  CKD, HTN, HLD   Additional history obtained:  External records from outside source obtained and reviewed including visit to oncology dated 03/01/2022 for normocytic normochromic anemia, leukocytosis, thrombocytosis.   Lab Tests:  I Ordered, and personally interpreted labs.  The pertinent results include:  troponins are unremarkable at 5 and 5.  CMP with elevated creatinine at 2.56, stable, known CKD.  Lipase minimally elevated at 68.  Urinalysis with protein likely secondary to CKD, no evidence of UTI.  CBC with elevated white count at 17.5, prior hematology workup for same.   Imaging Studies ordered:  I ordered imaging studies including chest x-ray, CT abdomen pelvis without contrast I independently visualized and interpreted imaging which showed diverticulosis, no diverticulitis, no acute process I agree with the radiologist interpretation   Cardiac Monitoring: / EKG:  The patient was maintained on a cardiac monitor.  I personally viewed and  interpreted the cardiac monitored which showed an underlying rhythm of: Sinus rhythm, rate 91   Problem List / ED Course / Critical interventions / Medication management  42 year old female presents with complaint of left upper quadrant abdominal pain as above.  Found to have left upper quadrant abdominal tenderness on exam, no chest wall tenderness.  Cardiac workup is reassuring.  No findings on abdominal workup to explain patient's symptoms today.  Will treat with GI cocktail and provide referral to GI, prescription for omeprazole.  Return to ER for worsening or concerning symptoms. I ordered medication including GI cocktail, morphine, Zofran for left upper quadrant abdominal pain Reevaluation of the  patient after these medicines showed that the patient improved I have reviewed the patients home medicines and have made adjustments as needed   Social Determinants of Health:  Has PCP through family medicine   Test / Admission - Considered:  Stable for discharge with referral to GI.         Final Clinical Impression(s) / ED Diagnoses Final diagnoses:  Left upper quadrant abdominal pain    Rx / DC Orders ED Discharge Orders          Ordered    omeprazole (PRILOSEC) 20 MG capsule  Daily        10/24/22 0719              Jeannie Fend, PA-C 10/24/22 1610    Glynn Octave, MD 10/24/22 [phone]

## 2022-10-24 NOTE — ED Notes (Signed)
Pt back from x-ray, laying in hospital bed, connected to cardiac monitor

## 2022-10-24 NOTE — Discharge Instructions (Signed)
Take omeprazole daily as prescribed.  Follow-up with GI, call to schedule appointment.  Please help with [place] and wellness if you do not have a primary care provider.  Return to the emergency room for worsening or concerning symptoms.

## 2022-10-26 ENCOUNTER — Emergency Department (HOSPITAL_COMMUNITY): Payer: Medicaid Other

## 2022-10-26 ENCOUNTER — Emergency Department (HOSPITAL_COMMUNITY)
Admission: EM | Admit: 2022-10-26 | Discharge: 2022-10-26 | Disposition: A | Discharge status: Home / Self Care | Payer: Medicaid Other | Attending: Emergency Medicine | Admitting: Emergency Medicine

## 2022-10-26 ENCOUNTER — Ambulatory Visit: Payer: Medicaid Other | Admitting: Family Medicine

## 2022-10-26 ENCOUNTER — Encounter (HOSPITAL_COMMUNITY): Payer: Self-pay

## 2022-10-26 ENCOUNTER — Other Ambulatory Visit: Payer: Self-pay

## 2022-10-26 VITALS — BP 126/80 | HR 85 | Temp 96.5°F | Ht 63.0 in | Wt 182.6 lb

## 2022-10-26 DIAGNOSIS — R197 Diarrhea, unspecified: Secondary | ICD-10-CM | POA: Diagnosis not present

## 2022-10-26 DIAGNOSIS — R1084 Generalized abdominal pain: Secondary | ICD-10-CM | POA: Diagnosis not present

## 2022-10-26 DIAGNOSIS — D72829 Elevated white blood cell count, unspecified: Secondary | ICD-10-CM | POA: Insufficient documentation

## 2022-10-26 DIAGNOSIS — R112 Nausea with vomiting, unspecified: Secondary | ICD-10-CM | POA: Insufficient documentation

## 2022-10-26 DIAGNOSIS — N189 Chronic kidney disease, unspecified: Secondary | ICD-10-CM | POA: Diagnosis not present

## 2022-10-26 DIAGNOSIS — R9431 Abnormal electrocardiogram [ECG] [EKG]: Secondary | ICD-10-CM | POA: Diagnosis not present

## 2022-10-26 LAB — CBC
HCT: 36.2 % (ref 36.0–46.0)
Hemoglobin: 11.6 g/dL — ABNORMAL LOW (ref 12.0–15.0)
MCH: 26.3 pg (ref 26.0–34.0)
MCHC: 32 g/dL (ref 30.0–36.0)
MCV: 82.1 fL (ref 80.0–100.0)
Platelets: 452 10*3/uL — ABNORMAL HIGH (ref 150–400)
RBC: 4.41 MIL/uL (ref 3.87–5.11)
RDW: 13.9 % (ref 11.5–15.5)
WBC: 18.4 10*3/uL — ABNORMAL HIGH (ref 4.0–10.5)
nRBC: 0 % (ref 0.0–0.2)

## 2022-10-26 LAB — URINALYSIS, ROUTINE W REFLEX MICROSCOPIC
Bilirubin Urine: NEGATIVE
Glucose, UA: NEGATIVE mg/dL
Ketones, ur: NEGATIVE mg/dL
Leukocytes,Ua: NEGATIVE
Nitrite: NEGATIVE
Protein, ur: 100 mg/dL — AB
Specific Gravity, Urine: 1.002 — ABNORMAL LOW (ref 1.005–1.030)
pH: 6 (ref 5.0–8.0)

## 2022-10-26 LAB — COMPREHENSIVE METABOLIC PANEL
ALT: 17 U/L (ref 0–44)
AST: 17 U/L (ref 15–41)
Albumin: 3.4 g/dL — ABNORMAL LOW (ref 3.5–5.0)
Alkaline Phosphatase: 123 U/L (ref 38–126)
Anion gap: 8 (ref 5–15)
BUN: 33 mg/dL — ABNORMAL HIGH (ref 6–20)
CO2: 17 mmol/L — ABNORMAL LOW (ref 22–32)
Calcium: 8.6 mg/dL — ABNORMAL LOW (ref 8.9–10.3)
Chloride: 108 mmol/L (ref 98–111)
Creatinine, Ser: 2.32 mg/dL — ABNORMAL HIGH (ref 0.44–1.00)
GFR, Estimated: 26 mL/min — ABNORMAL LOW (ref >60)
Glucose, Bld: 92 mg/dL (ref 70–99)
Potassium: 4.4 mmol/L (ref 3.5–5.1)
Sodium: 133 mmol/L — ABNORMAL LOW (ref 135–145)
Total Bilirubin: 0.5 mg/dL (ref 0.3–1.2)
Total Protein: 7.3 g/dL (ref 6.5–8.1)

## 2022-10-26 LAB — TROPONIN I (HIGH SENSITIVITY)
Troponin I (High Sensitivity): 5 ng/L (ref <18)
Troponin I (High Sensitivity): 3 ng/L (ref <18)

## 2022-10-26 LAB — LIPASE, BLOOD: Lipase: 53 U/L — ABNORMAL HIGH (ref 11–51)

## 2022-10-26 MED ORDER — DICYCLOMINE HCL 20 MG PO TABS: 20.0000 mg | ORAL_TABLET | Freq: Two times a day (BID) | ORAL | 0 refills | Status: DC

## 2022-10-26 MED ORDER — DICYCLOMINE HCL 10 MG PO CAPS
10.0000 mg | ORAL_CAPSULE | Freq: Once | ORAL | Status: AC
  Administered 2022-10-26: 10 mg via ORAL
  Filled 2022-10-26: qty 1

## 2022-10-26 MED ORDER — ONDANSETRON 4 MG PO TBDP
4.0000 mg | ORAL_TABLET | Freq: Once | ORAL | Status: AC
Start: 2022-10-26 — End: 2022-10-26
  Administered 2022-10-26: 4 mg via ORAL

## 2022-10-26 MED ORDER — ONDANSETRON 4 MG PO TBDP
4.0000 mg | ORAL_TABLET | Freq: Three times a day (TID) | ORAL | 0 refills | Status: AC | PRN
Start: 2022-10-26 — End: ?

## 2022-10-26 MED ORDER — SODIUM CHLORIDE 0.9 % IV BOLUS
1000.0000 mL | Freq: Once | INTRAVENOUS | Status: AC
  Administered 2022-10-26: 1000 mL via INTRAVENOUS

## 2022-10-26 MED ORDER — HYDROMORPHONE HCL 1 MG/ML IJ SOLN
0.5000 mg | Freq: Once | INTRAMUSCULAR | Status: AC
  Administered 2022-10-26: 0.5 mg via INTRAVENOUS
  Filled 2022-10-26: qty 1

## 2022-10-26 MED ORDER — LOPERAMIDE HCL 2 MG PO CAPS: 2.0000 mg | ORAL_CAPSULE | Freq: Four times a day (QID) | ORAL | 0 refills | Status: DC | PRN

## 2022-10-26 NOTE — ED Triage Notes (Signed)
Patient c/o diffuse abd pain with no relief with medication. Seen recently for same and has continued with N/V/D. No fever. 10/10 pain across entire abdomen, no longer localized to left upper quadrant.

## 2022-10-26 NOTE — Progress Notes (Signed)
    SUBJECTIVE:   CHIEF COMPLAINT / HPI:   ED Follow up: LUQ Abdominal Pain   ED HPI: "42 year old female with complaint of LUQ abdominal pain, onset Friday, intermittent, radiates around to left flank. Nothing makes pain better or worse including taking a deep breath, eating, movement, exertion. With nausea, denies vomiting, fevers, chills, changes in bowel or bladder habits, SHOB. No other complaints or concerns. "  Lipase was negative. UA negative. Troponins negative.  CBC with elevated WBC to 17.5. Creatinine elevated to 2.56, but stable from prior. BUN 36.   However, patient has CKD and patient has been evaluated by heme/onc for anemia, leukocytosis, and thrombocytosis.  Patient has had full workup with heme-onc as well as rheumatology that was unremarkable.  Heme-onc has just been following up with patient annually now.  Imagining was performed with chest xray and CT abdomen pelvis which was remarkable for diverticulosis no diverticulitis and no other acute pathology.   Patient was treated with GI cocktail, morphine, and zofran with improvement of symptoms, and given a GI referral.   On today's visit, patient says that she has been vomiting frequently since she was discharged from the ED and her left upper quadrant abdominal pain has worsened in intensity as well as now has spread throughout her lower abdomen as well.  She now also has diarrhea.  Has not been able to keep much down.  She tried to take the omeprazole that was prescribed by the ED but said that this made her cramping worse and so did not continue it.  Denies blood per rectum.  Denies hematemesis.  PERTINENT  PMH / PSH: CKD, hypertension, chronic anemia/leukocytosis/thrombocytosis,  OBJECTIVE:   BP 126/80   Pulse 85   Temp (!) 96.5 F (35.8 C) (Axillary)   Wt 182 lb 9.6 oz (82.8 kg)   SpO2 100%   BMI 32.35 kg/m   General: Ill-appearing, in intense pain, actively vomiting and then having diarrhea during visit CV:  Mildly tachycardic, though regular rhythm, cap refill equals 3 seconds, pale appearing Resp: Normal work of breathing on room air Abd: Very tender, non distended Neuro: Alert & Oriented x 4    ASSESSMENT/PLAN:   Assessment & Plan Nausea and vomiting, unspecified vomiting type With worsening of abdominal pain and now progression to also having emesis and diarrhea concern for worsening of some pathology.  Patient is hemodynamically stable at this time.  However wonder if patient has choledocholithiasis or pancreatitis or now has diverticulitis from diverticulosis on previous imaging.  BUN was also elevated for patient in the ED.  Patient could be uremic given history of CKD.  However patient is dehydrated and in intense pain. - Will send patient to the ED straight from clinic with CMA, called ED charge nurse to give signout - Gave 4 mg Zofran dissolvable tablet - Patient has appointment with me on Friday we will keep this appointment to follow-up      Lockie Mola, MD [facility]

## 2022-10-26 NOTE — ED Notes (Signed)
Pt is requesting pain medication.  Message sent to provider via secure chat.

## 2022-10-26 NOTE — Discharge Instructions (Addendum)
It was a pleasure taking care of you here in the emergency department  Your CT scans did not show any significant findings  We discussed starting on a few medications.  Bentyl - pain medicine for your abdominal pain Imodium, 2 tablets for first episode of diarrhea, 1 tablet for each additional dose. DO not take more than 6 tablets in 24 hours. Do not take for more than 2 days Zofran- a nausea medication  Follow-up with your primary care provider on Friday, return for new or worsening symptoms

## 2022-10-26 NOTE — ED Notes (Signed)
Patient reports that she has been throwing up all her meds including her BP meds.

## 2022-10-26 NOTE — ED Provider Notes (Signed)
[place] EMERGENCY DEPARTMENT AT [facility] Provider Note   CSN: [id number] Arrival date & time: 10/26/22  1405    History  Chief Complaint  Patient presents with   Abdominal Pain   Emesis   Diarrhea    Zoe Woodard is a 42 y.o. female here for evaluation of abdominal pain, vomiting and diarrhea.  Was seen on Friday 10/12 for upper abdominal pain.  Had CT scan which did not show any significant abnormality.  Subsequently sent home on PPI.  She took a dose however it "made my stomach hurt" so she stopped taking it.  She was seen again by PCP as she has had diffuse abdominal pain now as well as nausea and vomiting, diarrhea.  They subsequently sent her here for evaluation.  She was not discharged on antiemetic her was given 1 at her PCP office prior to arrival.  She denies any fever, cough, congestion, chest pain, shortness of breath, back pain, dysuria, hematuria.  Emesis is NBNB.  States she is having loose stool without any blood.  No recent antibiotics or travel.  No sick contacts.  Does have history of CKD.  No pain or swelling to lower legs.  Eating, drinking, exertion, deep breathing does not worsen her pain.  She has never had anything like this previously.  No dysuria or hematuria.  No vaginal discharge.  She is not sexually active, no concern for STD.  She had prior hysterectomy.  Denies chance of pregnancy  Diarrhea 2-4 episodes daily since Friday  HPI     Home Medications Prior to Admission medications   Medication Sig Start Date End Date Taking? Authorizing Provider  acetaminophen (TYLENOL) 500 MG tablet Take 1,000 mg by mouth every 6 (six) hours as needed for mild pain (pain score 1-3) or moderate pain (pain score 4-6).   Yes [provider]  benzonatate (TESSALON) 100 MG capsule Take 1 capsule (100 mg total) by mouth 2 (two) times daily as needed. 10/03/20  Yes Simmons-Robinson, Makiera, MD  diclofenac Sodium (VOLTAREN) 1 % GEL Apply 4 g topically 4  (four) times daily. 11/21/20  Yes Simmons-Robinson, Makiera, MD  dicyclomine (BENTYL) 20 MG tablet Take 1 tablet (20 mg total) by mouth 2 (two) times daily. 10/26/22  Yes Letica Giaimo A, PA-C  famotidine (PEPCID) 20 MG tablet TAKE 1 TABLET BY MOUTH TWICE A DAY 06/29/22  Yes Lockie Mola, MD  loperamide (IMODIUM) 2 MG capsule Take 1 capsule (2 mg total) by mouth 4 (four) times daily as needed for diarrhea or loose stools. 10/26/22  Yes Gursimran Litaker A, PA-C  loratadine (CLARITIN) 10 MG tablet Take 1 tablet (10 mg total) by mouth daily. Patient taking differently: Take 10 mg by mouth in the morning. 08/18/22  Yes Lockie Mola, MD  losartan (COZAAR) 50 MG tablet Take 50 mg by mouth at bedtime. 09/10/20  Yes [provider]  metoprolol tartrate (LOPRESSOR) 25 MG tablet TAKE 1 TABLET BY MOUTH TWICE A DAY Patient taking differently: Take 25 mg by mouth in the morning. 10/12/22 01/10/23 Yes Lockie Mola, MD  montelukast (SINGULAIR) 10 MG tablet TAKE 1 TABLET BY MOUTH EVERYDAY AT BEDTIME Patient taking differently: Take 10 mg by mouth at bedtime. 08/18/22  Yes Lockie Mola, MD  omeprazole (PRILOSEC) 20 MG capsule Take 1 capsule (20 mg total) by mouth daily. 10/24/22  Yes Jeannie Fend, PA-C  ondansetron (ZOFRAN-ODT) 4 MG disintegrating tablet Take 1 tablet (4 mg total) by mouth every 8 (eight) hours  as needed. 10/26/22  Yes Gladine Plude A, PA-C  Vitamin D, Ergocalciferol, (DRISDOL) 1.25 MG (50000 UNIT) CAPS capsule TAKE 1 CAPSULE BY MOUTH ONE TIME PER WEEK Patient taking differently: Take 50,000 Units by mouth once a week. 05/26/22  Yes Lockie Mola, MD      Allergies    Patient has no known allergies.    Review of Systems   Review of Systems  Constitutional: Negative.   HENT: Negative.    Respiratory: Negative.    Cardiovascular: Negative.   Gastrointestinal:  Positive for abdominal pain, diarrhea, nausea and vomiting. Negative for abdominal distention, anal bleeding, blood  in stool, constipation and rectal pain.  Genitourinary: Negative.   Musculoskeletal: Negative.   Skin: Negative.   Neurological: Negative.   All other systems reviewed and are negative.  Physical Exam Updated Vital Signs BP 129/83   Pulse 85   Temp 99 F (37.2 C) (Oral)   Resp (!) 25   Ht 5\' 3"  (1.6 m)   Wt 82.8 kg   SpO2 100%   BMI 32.35 kg/m  Physical Exam Vitals and nursing note reviewed.  Constitutional:      General: She is not in acute distress.    Appearance: She is well-developed. She is not ill-appearing, toxic-appearing or diaphoretic.  HENT:     Head: Atraumatic.  Eyes:     Pupils: Pupils are equal, round, and reactive to light.  Cardiovascular:     Rate and Rhythm: Normal rate.  Pulmonary:     Effort: No respiratory distress.  Abdominal:     General: Bowel sounds are normal. There is no distension.     Palpations: Abdomen is soft.     Tenderness: There is generalized abdominal tenderness. There is no right CVA tenderness, left CVA tenderness, guarding or rebound. Negative signs include Murphy's sign and McBurney's sign.     Hernia: No hernia is present.  Genitourinary:    Comments: declines Musculoskeletal:        General: Normal range of motion.     Cervical back: Normal range of motion.     Comments: No bony tenderness, compartments soft.  Skin:    General: Skin is warm and dry.     Capillary Refill: Capillary refill takes less than 2 seconds.     Comments: No obvious rashes or lesions on exposed skin  Neurological:     General: No focal deficit present.     Mental Status: She is alert.  Psychiatric:        Mood and Affect: Mood normal.    ED Results / Procedures / Treatments   Labs (all labs ordered are listed, but only abnormal results are displayed) Labs Reviewed  LIPASE, BLOOD - Abnormal; Notable for the following components:      Result Value   Lipase 53 (*)    All other components within normal limits  COMPREHENSIVE METABOLIC PANEL -  Abnormal; Notable for the following components:   Sodium 133 (*)    CO2 17 (*)    BUN 33 (*)    Creatinine, Ser 2.32 (*)    Calcium 8.6 (*)    Albumin 3.4 (*)    GFR, Estimated 26 (*)    All other components within normal limits  CBC - Abnormal; Notable for the following components:   WBC 18.4 (*)    Hemoglobin 11.6 (*)    Platelets 452 (*)    All other components within normal limits  URINALYSIS, ROUTINE W REFLEX MICROSCOPIC - Abnormal;  Notable for the following components:   Color, Urine STRAW (*)    Specific Gravity, Urine 1.002 (*)    Hgb urine dipstick SMALL (*)    Protein, ur 100 (*)    Bacteria, UA RARE (*)    All other components within normal limits  TROPONIN I (HIGH SENSITIVITY)  TROPONIN I (HIGH SENSITIVITY)    EKG EKG Interpretation Date/Time:  Tuesday October 26 2022 16:54:14 EDT Ventricular Rate:  83 PR Interval:  144 QRS Duration:  81 QT Interval:  381 QTC Calculation: 448 R Axis:   40  Text Interpretation: Sinus rhythm Low voltage, precordial leads No significant change since last tracing Confirmed by Melene Plan [phone]) on 10/26/2022 4:55:57 PM  Radiology CT ABDOMEN PELVIS WO CONTRAST  Result Date: 10/26/2022 CLINICAL DATA:  Abdominal pain. EXAM: CT ABDOMEN AND PELVIS WITHOUT CONTRAST TECHNIQUE: Multidetector CT imaging of the abdomen and pelvis was performed following the standard protocol without IV contrast. RADIATION DOSE REDUCTION: This exam was performed according to the departmental dose-optimization program which includes automated exposure control, adjustment of the mA and/or kV according to patient size and/or use of iterative reconstruction technique. COMPARISON:  October 24, 2022 FINDINGS: Lower chest: No acute abnormality. Hepatobiliary: No focal liver abnormality is seen. No gallstones, gallbladder wall thickening, or biliary dilatation. Pancreas: Unremarkable. No pancreatic ductal dilatation or surrounding inflammatory changes. Spleen: Normal in  size without focal abnormality. Adrenals/Urinary Tract: Adrenal glands are unremarkable. Kidneys are normal, without renal calculi, focal lesion, or hydronephrosis. Bladder is unremarkable. Stomach/Bowel: Stomach is within normal limits. The appendix is surgically absent. No evidence of bowel wall thickening, distention, or inflammatory changes. Noninflamed diverticula are seen within the mid to distal descending colon and proximal to mid sigmoid colon. Vascular/Lymphatic: No significant vascular findings are present. No enlarged abdominal or pelvic lymph nodes. Reproductive: Status post hysterectomy. Stable 3.6 cm and 1.6 cm diameter right adnexal cysts are seen. A right-sided tubal ligation clip is also noted. The left adnexa is unremarkable. Other: No abdominal wall hernia or abnormality. No abdominopelvic ascites. Musculoskeletal: No acute or significant osseous findings. IMPRESSION: 1. Colonic diverticulosis. 2. Evidence of prior hysterectomy and appendectomy. 3. Stable right adnexal cysts. No follow-up imaging is recommended. This recommendation follows ACR consensus guidelines: White Paper of the ACR Incidental Findings Committee II on Adnexal Findings. J Am Coll Radiol [id number]. Electronically Signed   By: Aram Candela M.D.   On: 10/26/2022 20:12    Procedures Procedures    Medications Ordered in ED Medications  HYDROmorphone (DILAUDID) injection 0.5 mg (0.5 mg Intravenous Given 10/26/22 1642)  sodium chloride 0.9 % bolus 1,000 mL (0 mLs Intravenous Stopped 10/26/22 1745)  dicyclomine (BENTYL) capsule 10 mg (10 mg Oral Given 10/26/22 2057)   ED Course/ Medical Decision Making/ A&P   42 year old here for evaluation of abdominal pain.  Was seen initially on Friday when symptoms started had upper abdominal pain at that time.  Subsequently discharged home had negative workup.  She was seen by her PCP today as she continued to have nausea, vomiting, diarrhea and abdominal pain.  Not  taking any antiemetics at home.  No recent antibiotics or travel.  She had 4 episodes of loose stool daily.  Pain not worse with movement, eating, no pleuritic pain, no chest pain, back pain.  She is not sexually active, no concerns for STDs.  No urinary symptoms.  No history of kidney stones.  Will plan on labs and imaging, reassess.  Labs and imaging personally viewed  interpreted  CBC leukocytosis 18.4, per chart review she has chronic leukocytosis however this is mildly improved from baseline, she denies any recent steroid use Metabolic panel Sodium 133, creatinine 2.3 similar to baseline Lipase 53, downtrending from Fridays results Troponin 5--3 EKG without ischemic changes CT abdomen pelvis without acute abnormality she does have a right adnexal cyst.  While she does have a cyst her pain is diffuse in nature, with associated diarrhea.  I have low suspicion for intermittent/torsion, PID, TOA.  Patient reassessed.  Requesting additional pain medication.  Will try Bentyl.  Patient reassessed.  Pain significantly improved with Bentyl.  She is tolerating p.o. intake.  I discussed labs and imaging with patient.  She was unable to provide stool sample here in ED.  She does have follow-up appointment with PCP on Friday.  Will send her Bentyl, Imodium, Zofran.  Will have her follow-up outpatient.  Patient is nontoxic, nonseptic appearing, in no apparent distress.  Patient's pain and other symptoms adequately managed in emergency department.  Fluid bolus given.  Labs, imaging and vitals reviewed.  Patient does not meet the SIRS or Sepsis criteria.  On repeat exam patient does not have a surgical abdomin and there are no peritoneal signs.  No indication of appendicitis, bowel obstruction, bowel perforation, cholecystitis, diverticulitis, PID, torsion, TOA, AAA, PE, Pneumothorax, ACS, dissection or ectopic pregnancy.  Patient discharged home with symptomatic treatment and given strict instructions for  follow-up with their primary care physician.  I have also discussed reasons to return immediately to the ER.  Patient expresses understanding and agrees with plan.                                 Medical Decision Making Amount and/or Complexity of Data Reviewed Independent Historian: friend External Data Reviewed: labs, radiology, ECG and notes. Labs: ordered. Decision-making details documented in ED Course. Radiology: ordered and independent interpretation performed. Decision-making details documented in ED Course. ECG/medicine tests: ordered and independent interpretation performed. Decision-making details documented in ED Course.  Risk OTC drugs. Prescription drug management. Parenteral controlled substances. Decision regarding hospitalization. Diagnosis or treatment significantly limited by social determinants of health.          Final Clinical Impression(s) / ED Diagnoses Final diagnoses:  Generalized abdominal pain  Nausea vomiting and diarrhea  Leukocytosis, unspecified type    Rx / DC Orders ED Discharge Orders          Ordered    dicyclomine (BENTYL) 20 MG tablet  2 times daily        10/26/22 2149    loperamide (IMODIUM) 2 MG capsule  4 times daily PRN        10/26/22 2149    ondansetron (ZOFRAN-ODT) 4 MG disintegrating tablet  Every 8 hours PRN        10/26/22 2149              Masud Holub A, PA-C 10/26/22 2256    Melene Plan, DO 10/26/22 2300

## 2022-10-29 ENCOUNTER — Ambulatory Visit: Payer: Medicaid Other | Admitting: Family Medicine

## 2022-10-29 ENCOUNTER — Other Ambulatory Visit: Payer: Self-pay

## 2022-10-29 ENCOUNTER — Encounter: Payer: Self-pay | Admitting: Family Medicine

## 2022-10-29 VITALS — BP 113/80 | HR 92 | Ht 63.0 in | Wt 181.4 lb

## 2022-10-29 DIAGNOSIS — R197 Diarrhea, unspecified: Secondary | ICD-10-CM | POA: Diagnosis not present

## 2022-10-29 DIAGNOSIS — R1030 Lower abdominal pain, unspecified: Secondary | ICD-10-CM

## 2022-10-29 MED ORDER — DICYCLOMINE HCL 20 MG PO TABS: 20.0000 mg | ORAL_TABLET | Freq: Three times a day (TID) | ORAL | 0 refills | Status: AC

## 2022-10-29 MED ORDER — LOPERAMIDE HCL 2 MG PO CAPS
2.0000 mg | ORAL_CAPSULE | Freq: Four times a day (QID) | ORAL | 0 refills | Status: DC | PRN
Start: 2022-10-29 — End: 2023-01-14

## 2022-10-29 NOTE — Patient Instructions (Addendum)
It was wonderful to see you today.  Please bring ALL of your medications with you to every visit.   Today we talked about:  Please try to take the bentyl only 2-3 times a day. Take loperamide for diarrhea.   Please leave the stool sample when you can.   You need a mammogram to prevent breast cancer.  Please schedule an appointment.  You can call [phone].     I have put in a referral for GI. Please look out for a phone call.   Thank you for choosing [facility].   Please call [phone] with any questions about today's appointment.  Lockie Mola, MD  Family Medicine

## 2022-10-31 NOTE — Progress Notes (Unsigned)
    SUBJECTIVE:   CHIEF COMPLAINT / HPI:   ED follow up  Patient went to the ED for abdominal pain, vomiting and diarrhea. Patient had lower abdominal pain as well as vomiting and diarrhea.  In the ED patient was hemodynamically stable exams were unremarkable patient was thought to have gastroenteritis and was treated with Bentyl, Zofran, Imodium.  Today patient says that she has been having to use the Bentyl every 7 hours for abdominal pain though it was prescribed twice daily.  Has also been using the Imodium (as prescribed) and Zofran though not having to use Zofran very often.  Says that abdominal pain has greatly improved and diarrhea has started to improve as well.  Patient is able to keep down liquids.  PERTINENT  PMH / PSH: CKD 4, hypertension, idiopathic anemia, leukocytosis, thrombocytosis  OBJECTIVE:   BP 113/80   Pulse 92   Ht 5\' 3"  (1.6 m)   Wt 181 lb 7.2 oz (82.3 kg)   SpO2 100%   BMI 32.14 kg/m   General: well appearing, in no acute distress CV: RRR, radial pulses equal and palpable, cap refill less than 2 seconds, no BLE edema  Resp: Normal work of breathing on room air, CTAB Abd: Soft, mildly tender in lower abdominal quadrants, no guarding no rebound guarding, non distended    ASSESSMENT/PLAN:   Assessment & Plan Lower abdominal pain Given that workup has been negative patient most likely just had gastroenteritis.  Given that patient is also improving unlikely bacterial cause also did not have any blood in stool. - Increase Bentyl to 3 times daily, advised patient to use sparingly due to CKD - Refilled Imodium - Will complete stool PCR and discontinue Imodium if there is a significant result       Lockie Mola, MD [facility]

## 2022-11-01 ENCOUNTER — Encounter: Payer: Self-pay | Admitting: Family Medicine

## 2022-11-01 DIAGNOSIS — R197 Diarrhea, unspecified: Secondary | ICD-10-CM | POA: Diagnosis not present

## 2022-11-01 DIAGNOSIS — R1030 Lower abdominal pain, unspecified: Secondary | ICD-10-CM | POA: Diagnosis not present

## 2022-11-01 NOTE — Addendum Note (Signed)
Addended by: Jennette Bill on: 11/01/2022 11:36 AM   Modules accepted: Orders

## 2022-11-01 NOTE — Assessment & Plan Note (Signed)
Given that workup has been negative patient most likely just had gastroenteritis.  Given that patient is also improving unlikely bacterial cause also did not have any blood in stool. - Increase Bentyl to 3 times daily, advised patient to use sparingly due to CKD - Refilled Imodium - Will complete stool PCR and discontinue Imodium if there is a significant result

## 2022-11-04 ENCOUNTER — Other Ambulatory Visit: Payer: Self-pay | Admitting: Family Medicine

## 2022-11-04 DIAGNOSIS — Z1231 Encounter for screening mammogram for malignant neoplasm of breast: Secondary | ICD-10-CM

## 2022-11-04 LAB — GI PROFILE, STOOL, PCR

## 2022-11-05 ENCOUNTER — Telehealth: Payer: Self-pay | Admitting: Family Medicine

## 2022-11-05 NOTE — Telephone Encounter (Signed)
Tried to call patient and tell her that labs were normal.   Patient did not answer the phone, left HIPAA compliant VM.

## 2022-11-08 ENCOUNTER — Ambulatory Visit
Admission: RE | Admit: 2022-11-08 | Discharge: 2022-11-08 | Disposition: A | Discharge status: Home / Self Care | Payer: Medicaid Other | Source: Ambulatory Visit | Attending: Pediatrics | Admitting: Pediatrics

## 2022-11-08 DIAGNOSIS — Z1231 Encounter for screening mammogram for malignant neoplasm of breast: Secondary | ICD-10-CM | POA: Diagnosis not present

## 2022-11-09 ENCOUNTER — Other Ambulatory Visit: Payer: Self-pay | Admitting: Family Medicine

## 2022-11-09 DIAGNOSIS — E559 Vitamin D deficiency, unspecified: Secondary | ICD-10-CM

## 2022-11-11 ENCOUNTER — Other Ambulatory Visit: Payer: Self-pay | Admitting: Family Medicine

## 2022-11-11 ENCOUNTER — Ambulatory Visit (INDEPENDENT_AMBULATORY_CARE_PROVIDER_SITE_OTHER): Payer: Medicaid Other | Admitting: Family Medicine

## 2022-11-11 ENCOUNTER — Encounter: Payer: Self-pay | Admitting: Family Medicine

## 2022-11-11 VITALS — BP 110/81 | HR 109 | Ht 63.0 in | Wt 182.4 lb

## 2022-11-11 DIAGNOSIS — H1131 Conjunctival hemorrhage, right eye: Secondary | ICD-10-CM | POA: Diagnosis present

## 2022-11-11 DIAGNOSIS — Z9109 Other allergy status, other than to drugs and biological substances: Secondary | ICD-10-CM | POA: Diagnosis not present

## 2022-11-11 DIAGNOSIS — R928 Other abnormal and inconclusive findings on diagnostic imaging of breast: Secondary | ICD-10-CM

## 2022-11-11 MED ORDER — CETIRIZINE HCL 10 MG PO TABS
10.0000 mg | ORAL_TABLET | Freq: Every day | ORAL | 11 refills | Status: DC
Start: 2022-11-11 — End: 2023-01-14

## 2022-11-11 MED ORDER — HYPROMELLOSE (GONIOSCOPIC) 2.5 % OP SOLN: 1.0000 [drp] | Freq: Three times a day (TID) | OPHTHALMIC | 12 refills | Status: DC | PRN

## 2022-11-11 NOTE — Patient Instructions (Addendum)
It was wonderful to see you today.  Please bring ALL of your medications with you to every visit.   Today we talked about:  Subconjunctival hemorrhage  - I think a small capillary in your eye may have started bleeding from itching from allergies. I have prescribed zyrtec (an allergy medicine) and artificial tears which will help keep your eye moist. If this does not help with itching, you can also take flonase over the counter.     Thank you for choosing [facility].   Please call [phone] with any questions about today's appointment.   Lockie Mola, MD  Family Medicine

## 2022-11-11 NOTE — Progress Notes (Signed)
    SUBJECTIVE:   CHIEF COMPLAINT / HPI:   Red spot in eye  Patient comes to appointment today as she said she noticed a red spot in her right eye. Patient says that she has had more allergy symptoms and so she has been itching her eyes. Noticed some pain and discomfort in that right eye thereafter. No pain with eye movements or blinking.    PERTINENT  PMH / PSH: HTN, CKD4   OBJECTIVE:   BP 110/81   Pulse (!) 109   Ht 5\' 3"  (1.6 m)   Wt 182 lb 6.4 oz (82.7 kg)   SpO2 100%   BMI 32.31 kg/m   General: well appearing, in no acute distress HEENT: erythematous collection underneath iris on the right side. (Picture in media tab)  CV: RRR, radial pulses equal and palpable, no BLE edema    ASSESSMENT/PLAN:   Assessment & Plan Subconjunctival hemorrhage of right eye Most likely due to increased pressure and trauma from itching and allergies. No affects to the vision.  - zyrtec and flonase as needed  - artificial tears.  - Follow up if eye pain worsens  Environmental allergies As above       Lockie Mola, MD [facility]

## 2022-11-12 ENCOUNTER — Encounter: Payer: Self-pay | Admitting: Gastroenterology

## 2022-11-12 ENCOUNTER — Ambulatory Visit (INDEPENDENT_AMBULATORY_CARE_PROVIDER_SITE_OTHER): Payer: Medicaid Other | Admitting: Gastroenterology

## 2022-11-12 VITALS — BP 104/80 | HR 74 | Ht 63.0 in | Wt 182.0 lb

## 2022-11-12 DIAGNOSIS — R110 Nausea: Secondary | ICD-10-CM | POA: Diagnosis not present

## 2022-11-12 DIAGNOSIS — K529 Noninfective gastroenteritis and colitis, unspecified: Secondary | ICD-10-CM

## 2022-11-12 NOTE — Progress Notes (Signed)
[facility] Consult Note:  History: Zoe Woodard 11/12/2022  Referring provider: Lockie Mola, MD  Reason for consult/chief complaint: Abdominal Pain (Pt states sh is having sharp and dull abd ,pt states she has been having pain since Nov. 12 ), Nausea (Pt states she having nausea and vomiting with this pain), and Diarrhea (Pt states she is having diarrhea with this pain)   Subjective  HPI: This is a 42 year old woman referred to [place] by primary care for acute abdominal pain with nausea and vomiting for which she has had 2 recent ED visits (reports below).  PCP note on 10/29/2022 indicates clinical suspicion of gastroenteritis and that patient was clinically improving on Bentyl.  Today, she states that her abdominal pain has significantly improved, she continues to experience some crampy lower pain every now and then. She recalls that her pain started in her LUQ region and had radiated to the remainder of her abdominal region, then escalated over about 2 days and became generalized prior to the ED visit. She was experiencing accompanying diarrhea, nausea and vomiting which caused her to visit the ED. She denies experiencing any of these or other chronic digestive symptoms prior to this episode.   She is currently experiencing occasional nausea but states that she typically manages it with Zofran. She denies any unintentional weight loss, reflux, rash, or dysphagia. She is currently taking dicyclomine as needed.  Overall, she does confirm that the symptoms have steadily subsided since ED visits and subsequent primary care appointment.  She no longer takes omeprazole or Imodium, and is using the ondansetron and dicyclomine as needed. ROS:  Review of Systems  Constitutional:  Negative for appetite change and fever.  HENT:  Negative for trouble swallowing.   Respiratory:  Negative for cough and shortness of breath.   Cardiovascular:  Negative for chest pain.   Gastrointestinal:  Positive for abdominal pain, diarrhea, nausea and vomiting. Negative for abdominal distention, anal bleeding, blood in stool, constipation and rectal pain.  Genitourinary:  Negative for dysuria.  Musculoskeletal:  Negative for back pain.  Skin:  Negative for rash.  Neurological:  Negative for weakness.  All other systems reviewed and are negative.    Past Medical History: Past Medical History:  Diagnosis Date   Abdominal pain 12/28/2019   Back pain 10/10/2019   Chronic kidney disease    Ear pain, bilateral 04/15/2020   Hyperlipidemia    Hypertensive urgency 07/22/2019   Hypocalcemia 08/22/2019   Kidney disease    stage 4   Low blood pressure 02/07/2020   Obesity    Pyelonephritis 11/08/2019     Past Surgical History: Past Surgical History:  Procedure Laterality Date   ABDOMINAL HYSTERECTOMY     APPENDECTOMY     bladder laceration  01/04/2015   excision of thyroglobal duct cyst  02/01/2012   right ovarian cystoectomy  01/02/2015   right ovarian egstectomy  04/27/2013   SPINE SURGERY     TONSILLECTOMY     TUBAL LIGATION       Family History: Family History  Problem Relation Age of Onset   Diabetes Mother    Healthy Son    Healthy Daughter    Healthy Daughter     Social History: Social History   Socioeconomic History   Marital status: Single    Spouse name: Not on file   Number of children: Not on file   Years of education: Not on file   Highest education level: Not on file  Occupational  History   Not on file  Tobacco Use   Smoking status: Never   Smokeless tobacco: Never  Vaping Use   Vaping status: Never Used  Substance and Sexual Activity   Alcohol use: No   Drug use: No   Sexual activity: Not Currently    Partners: Male    Birth control/protection: Surgical  Other Topics Concern   Not on file  Social History Narrative   Lives with 3 children    Not working currently   Not sexually active.    Social Determinants of  Health   Financial Resource Strain: Not on file  Food Insecurity: Not on file  Transportation Needs: Not on file  Physical Activity: Not on file  Stress: Not on file  Social Connections: Unknown (05/19/2021)   Received from [facility], [facility]   Social Network    Social Network: Not on file    Allergies: No Known Allergies  Outpatient Meds: Current Outpatient Medications  Medication Sig Dispense Refill   acetaminophen (TYLENOL) 500 MG tablet Take 1,000 mg by mouth every 6 (six) hours as needed for mild pain (pain score 1-3) or moderate pain (pain score 4-6).     cetirizine (ZYRTEC) 10 MG tablet Take 1 tablet (10 mg total) by mouth daily. 30 tablet 11   diclofenac Sodium (VOLTAREN) 1 % GEL Apply 4 g topically 4 (four) times daily. 150 g 6   famotidine (PEPCID) 20 MG tablet TAKE 1 TABLET BY MOUTH TWICE A DAY 180 tablet 1   hydroxypropyl methylcellulose / hypromellose (ISOPTO TEARS / GONIOVISC) 2.5 % ophthalmic solution Place 1 drop into the right eye 3 (three) times daily as needed for dry eyes. 15 mL 12   losartan (COZAAR) 50 MG tablet Take 50 mg by mouth at bedtime.     metoprolol tartrate (LOPRESSOR) 25 MG tablet TAKE 1 TABLET BY MOUTH TWICE A DAY (Patient taking differently: Take 25 mg by mouth in the morning.) 180 tablet 1   montelukast (SINGULAIR) 10 MG tablet TAKE 1 TABLET BY MOUTH EVERYDAY AT BEDTIME (Patient taking differently: Take 10 mg by mouth at bedtime.) 90 tablet 1   ondansetron (ZOFRAN-ODT) 4 MG disintegrating tablet Take 1 tablet (4 mg total) by mouth every 8 (eight) hours as needed. 20 tablet 0   Vitamin D, Ergocalciferol, (DRISDOL) 1.25 MG (50000 UNIT) CAPS capsule TAKE 1 CAPSULE BY MOUTH ONE TIME PER WEEK 12 capsule 1   dicyclomine (BENTYL) 20 MG tablet Take 1 tablet (20 mg total) by mouth 3 (three) times daily before meals. (Patient not taking: Reported on 11/12/2022) 21 tablet 0   loperamide (IMODIUM) 2 MG capsule Take 1 capsule (2 mg total) by mouth 4 (four)  times daily as needed for diarrhea or loose stools. (Patient not taking: Reported on 11/12/2022) 12 capsule 0   omeprazole (PRILOSEC) 20 MG capsule Take 1 capsule (20 mg total) by mouth daily. (Patient not taking: Reported on 11/12/2022) 30 capsule 0   No current facility-administered medications for this visit.      ___________________________________________________________________ Objective   Exam:  Ht 5\' 3"  (1.6 m)   Wt 182 lb (82.6 kg)   BMI 32.24 kg/m  Wt Readings from Last 3 Encounters:  11/12/22 182 lb (82.6 kg)  11/11/22 182 lb 6.4 oz (82.7 kg)  10/29/22 181 lb 7.2 oz (82.3 kg)    General: well-appearing   Eyes: sclera anicteric, no redness ENT: oral mucosa moist without lesions, no cervical or supraclavicular lymphadenopathy CV: RRR, no JVD,  no peripheral edema Resp: clear to auscultation bilaterally, normal RR and effort noted GI: soft, no tenderness, with active bowel sounds. No guarding or palpable organomegaly noted. Skin; warm and dry, no rash or jaundice noted Neuro: awake, alert and oriented x 3. Normal gross motor function and fluent speech   Labs:     Latest Ref Rng & Units 10/26/2022    2:37 PM 10/23/2022   11:25 PM 03/01/2022   10:58 AM  CBC  WBC 4.0 - 10.5 K/uL 18.4  17.5  14.7   Hemoglobin 12.0 - 15.0 g/dL 63.8  75.6  43.3   Hematocrit 36.0 - 46.0 % 36.2  35.5  34.8   Platelets 150 - 400 K/uL 452  433  415       Latest Ref Rng & Units 10/26/2022    2:37 PM 10/23/2022   11:25 PM 03/01/2022   10:58 AM  CMP  Glucose 70 - 99 mg/dL 92  295  188   BUN 6 - 20 mg/dL 33  36  28   Creatinine 0.44 - 1.00 mg/dL 4.16  6.06  3.01   Sodium 135 - 145 mmol/L 133  137  137   Potassium 3.5 - 5.1 mmol/L 4.4  4.2  3.5   Chloride 98 - 111 mmol/L 108  106  107   CO2 22 - 32 mmol/L 17  19  22    Calcium 8.9 - 10.3 mg/dL 8.6  8.4  7.8   Total Protein 6.5 - 8.1 g/dL 7.3  6.9  6.6   Total Bilirubin 0.3 - 1.2 mg/dL 0.5  0.4  0.3   Alkaline Phos 38 - 126 U/L 123   124  135   AST 15 - 41 U/L 17  18  16    ALT 0 - 44 U/L 17  15  18     GI pathogen panel negative on 10/29/2022  Radiologic Studies:  CLINICAL DATA:  Abdominal pain.   EXAM: CT ABDOMEN AND PELVIS WITHOUT CONTRAST   TECHNIQUE: Multidetector CT imaging of the abdomen and pelvis was performed following the standard protocol without IV contrast.   RADIATION DOSE REDUCTION: This exam was performed according to the departmental dose-optimization program which includes automated exposure control, adjustment of the mA and/or kV according to patient size and/or use of iterative reconstruction technique.   COMPARISON:  October 24, 2022   FINDINGS: Lower chest: No acute abnormality.   Hepatobiliary: No focal liver abnormality is seen. No gallstones, gallbladder wall thickening, or biliary dilatation.   Pancreas: Unremarkable. No pancreatic ductal dilatation or surrounding inflammatory changes.   Spleen: Normal in size without focal abnormality.   Adrenals/Urinary Tract: Adrenal glands are unremarkable. Kidneys are normal, without renal calculi, focal lesion, or hydronephrosis. Bladder is unremarkable.   Stomach/Bowel: Stomach is within normal limits. The appendix is surgically absent. No evidence of bowel wall thickening, distention, or inflammatory changes. Noninflamed diverticula are seen within the mid to distal descending colon and proximal to mid sigmoid colon.   Vascular/Lymphatic: No significant vascular findings are present. No enlarged abdominal or pelvic lymph nodes.   Reproductive: Status post hysterectomy. Stable 3.6 cm and 1.6 cm diameter right adnexal cysts are seen. A right-sided tubal ligation clip is also noted. The left adnexa is unremarkable.   Other: No abdominal wall hernia or abnormality. No abdominopelvic ascites.   Musculoskeletal: No acute or significant osseous findings.   IMPRESSION: 1. Colonic diverticulosis. 2. Evidence of prior hysterectomy  and appendectomy. 3. Stable right adnexal cysts. No  follow-up imaging is recommended. This recommendation follows ACR consensus guidelines: White Paper of the ACR Incidental Findings Committee II on Adnexal Findings. J Am Coll Radiol [phone].     Electronically Signed   By: Aram Candela M.D.   On: 10/26/2022 20:12 _________   Same findings on a CT abdomen and pelvis 2 days prior.  Assessment: Nausea in adult  Noninfectious gastroenteritis, unspecified type  Overall clinical picture most consistent with acute viral gastroenteritis with some postinfectious residual symptoms that are expected to resolve with time.  Continue symptomatic treatment, return to see me if symptoms do not resolve and, certainly if they worsen.  No further test planned at this point.  Return to see [place] age 54 for colorectal cancer screening.   - Amada Jupiter, MD    [facility]  Ladona Mow M Kadhim,acting as a scribe for Charlie Pitter III, MD.,have documented all relevant documentation on the behalf of Sherrilyn Rist, MD,as directed by  Sherrilyn Rist, MD while in the presence of Sherrilyn Rist, MD.   Marvis Repress III, MD, have reviewed all documentation for this visit. The documentation on 11/12/22 for the exam, diagnosis, procedures, and orders are all accurate and complete.    CC: Referring provider noted above

## 2022-11-12 NOTE — Patient Instructions (Signed)
   _______________________________________________________  If your blood pressure at your visit was 140/90 or greater, please contact your primary care physician to follow up on this.  _______________________________________________________  If you are age 43 or older, your body mass index should be between 23-30. Your Body mass index is 32.24 kg/m. If this is out of the aforementioned range listed, please consider follow up with your Primary Care Provider.  If you are age 64 or younger, your body mass index should be between 19-25. Your Body mass index is 32.24 kg/m. If this is out of the aformentioned range listed, please consider follow up with your Primary Care Provider.   ________________________________________________________  The [facility] providers would like to encourage you to use [facility] to communicate with providers for non-urgent requests or questions.  Due to long hold times on the telephone, sending your provider a message by [facility] may be a faster and more efficient way to get a response.  Please allow 48 business hours for a response.  Please remember that this is for non-urgent requests.  _______________________________________________________ It was a pleasure to see you today!  Thank you for trusting me with your gastrointestinal care!

## 2022-11-12 NOTE — Progress Notes (Deleted)
[facility] Consult Note:  History: Zoe Woodard 11/12/2022  Referring provider: Lockie Mola, MD  Reason for consult/chief complaint: No chief complaint on file.   Subjective  HPI: This is a 42 year old woman referred to [place] by primary care for acute abdominal pain with nausea and vomiting for which she has had 2 recent ED visits (reports below).  PCP note on 10/29/2022 indicates clinical suspicion of gastroenteritis and that patient was clinically improving on Bentyl.   ***   ROS:  Review of Systems   Past Medical History: Past Medical History:  Diagnosis Date   Abdominal pain 12/28/2019   Back pain 10/10/2019   Chronic kidney disease    Ear pain, bilateral 04/15/2020   Hyperlipidemia    Hypertensive urgency 07/22/2019   Hypocalcemia 08/22/2019   Low blood pressure 02/07/2020   Obesity    Pyelonephritis 11/08/2019     Past Surgical History: Past Surgical History:  Procedure Laterality Date   ABDOMINAL HYSTERECTOMY     APPENDECTOMY     bladder laceration  01/04/2015   excision of thyroglobal duct cyst  02/01/2012   right ovarian cystoectomy  01/02/2015   right ovarian egstectomy  04/27/2013   SPINE SURGERY     TONSILLECTOMY     TUBAL LIGATION       Family History: Family History  Problem Relation Age of Onset   Diabetes Mother    Healthy Son    Healthy Daughter    Healthy Daughter     Social History: Social History   Socioeconomic History   Marital status: Single    Spouse name: Not on file   Number of children: Not on file   Years of education: Not on file   Highest education level: Not on file  Occupational History   Not on file  Tobacco Use   Smoking status: Never   Smokeless tobacco: Never  Vaping Use   Vaping status: Never Used  Substance and Sexual Activity   Alcohol use: No   Drug use: No   Sexual activity: Not Currently    Partners: Male    Birth control/protection: Surgical  Other Topics Concern    Not on file  Social History Narrative   Lives with 3 children    Not working currently   Not sexually active.    Social Determinants of Health   Financial Resource Strain: Not on file  Food Insecurity: Not on file  Transportation Needs: Not on file  Physical Activity: Not on file  Stress: Not on file  Social Connections: Unknown (05/19/2021)   Received from [facility]   Social Network    Social Network: Not on file    Allergies: No Known Allergies  Outpatient Meds: Current Outpatient Medications  Medication Sig Dispense Refill   acetaminophen (TYLENOL) 500 MG tablet Take 1,000 mg by mouth every 6 (six) hours as needed for mild pain (pain score 1-3) or moderate pain (pain score 4-6).     cetirizine (ZYRTEC) 10 MG tablet Take 1 tablet (10 mg total) by mouth daily. 30 tablet 11   diclofenac Sodium (VOLTAREN) 1 % GEL Apply 4 g topically 4 (four) times daily. 150 g 6   dicyclomine (BENTYL) 20 MG tablet Take 1 tablet (20 mg total) by mouth 3 (three) times daily before meals. 21 tablet 0   famotidine (PEPCID) 20 MG tablet TAKE 1 TABLET BY MOUTH TWICE A DAY 180 tablet 1   hydroxypropyl methylcellulose / hypromellose (ISOPTO TEARS / GONIOVISC)  2.5 % ophthalmic solution Place 1 drop into the right eye 3 (three) times daily as needed for dry eyes. 15 mL 12   loperamide (IMODIUM) 2 MG capsule Take 1 capsule (2 mg total) by mouth 4 (four) times daily as needed for diarrhea or loose stools. 12 capsule 0   losartan (COZAAR) 50 MG tablet Take 50 mg by mouth at bedtime.     metoprolol tartrate (LOPRESSOR) 25 MG tablet TAKE 1 TABLET BY MOUTH TWICE A DAY (Patient taking differently: Take 25 mg by mouth in the morning.) 180 tablet 1   montelukast (SINGULAIR) 10 MG tablet TAKE 1 TABLET BY MOUTH EVERYDAY AT BEDTIME (Patient taking differently: Take 10 mg by mouth at bedtime.) 90 tablet 1   omeprazole (PRILOSEC) 20 MG capsule Take 1 capsule (20 mg total) by mouth daily. 30 capsule 0    ondansetron (ZOFRAN-ODT) 4 MG disintegrating tablet Take 1 tablet (4 mg total) by mouth every 8 (eight) hours as needed. 20 tablet 0   Vitamin D, Ergocalciferol, (DRISDOL) 1.25 MG (50000 UNIT) CAPS capsule TAKE 1 CAPSULE BY MOUTH ONE TIME PER WEEK 12 capsule 1   No current facility-administered medications for this visit.      ___________________________________________________________________ Objective   Exam:  There were no vitals taken for this visit. Wt Readings from Last 3 Encounters:  11/11/22 182 lb 6.4 oz (82.7 kg)  10/29/22 181 lb 7.2 oz (82.3 kg)  10/26/22 182 lb 9.6 oz (82.8 kg)    General: ***  Eyes: sclera anicteric, no redness ENT: oral mucosa moist without lesions, no cervical or supraclavicular lymphadenopathy CV: ***, no JVD, no peripheral edema Resp: clear to auscultation bilaterally, normal RR and effort noted GI: soft, *** tenderness, with active bowel sounds. No guarding or palpable organomegaly noted. Skin; warm and dry, no rash or jaundice noted Neuro: awake, alert and oriented x 3. Normal gross motor function and fluent speech  Labs:     Latest Ref Rng & Units 10/26/2022    2:37 PM 10/23/2022   11:25 PM 03/01/2022   10:58 AM  CBC  WBC 4.0 - 10.5 K/uL 18.4  17.5  14.7   Hemoglobin 12.0 - 15.0 g/dL 16.1  09.6  04.5   Hematocrit 36.0 - 46.0 % 36.2  35.5  34.8   Platelets 150 - 400 K/uL 452  433  415       Latest Ref Rng & Units 10/26/2022    2:37 PM 10/23/2022   11:25 PM 03/01/2022   10:58 AM  CMP  Glucose 70 - 99 mg/dL 92  409  811   BUN 6 - 20 mg/dL 33  36  28   Creatinine 0.44 - 1.00 mg/dL 9.14  7.82  9.56   Sodium 135 - 145 mmol/L 133  137  137   Potassium 3.5 - 5.1 mmol/L 4.4  4.2  3.5   Chloride 98 - 111 mmol/L 108  106  107   CO2 22 - 32 mmol/L 17  19  22    Calcium 8.9 - 10.3 mg/dL 8.6  8.4  7.8   Total Protein 6.5 - 8.1 g/dL 7.3  6.9  6.6   Total Bilirubin 0.3 - 1.2 mg/dL 0.5  0.4  0.3   Alkaline Phos 38 - 126 U/L 123  124  135    AST 15 - 41 U/L 17  18  16    ALT 0 - 44 U/L 17  15  18     GI pathogen panel negative  on 10/29/2022  Radiologic Studies:  CLINICAL DATA:  Abdominal pain.   EXAM: CT ABDOMEN AND PELVIS WITHOUT CONTRAST   TECHNIQUE: Multidetector CT imaging of the abdomen and pelvis was performed following the standard protocol without IV contrast.   RADIATION DOSE REDUCTION: This exam was performed according to the departmental dose-optimization program which includes automated exposure control, adjustment of the mA and/or kV according to patient size and/or use of iterative reconstruction technique.   COMPARISON:  October 24, 2022   FINDINGS: Lower chest: No acute abnormality.   Hepatobiliary: No focal liver abnormality is seen. No gallstones, gallbladder wall thickening, or biliary dilatation.   Pancreas: Unremarkable. No pancreatic ductal dilatation or surrounding inflammatory changes.   Spleen: Normal in size without focal abnormality.   Adrenals/Urinary Tract: Adrenal glands are unremarkable. Kidneys are normal, without renal calculi, focal lesion, or hydronephrosis. Bladder is unremarkable.   Stomach/Bowel: Stomach is within normal limits. The appendix is surgically absent. No evidence of bowel wall thickening, distention, or inflammatory changes. Noninflamed diverticula are seen within the mid to distal descending colon and proximal to mid sigmoid colon.   Vascular/Lymphatic: No significant vascular findings are present. No enlarged abdominal or pelvic lymph nodes.   Reproductive: Status post hysterectomy. Stable 3.6 cm and 1.6 cm diameter right adnexal cysts are seen. A right-sided tubal ligation clip is also noted. The left adnexa is unremarkable.   Other: No abdominal wall hernia or abnormality. No abdominopelvic ascites.   Musculoskeletal: No acute or significant osseous findings.   IMPRESSION: 1. Colonic diverticulosis. 2. Evidence of prior hysterectomy and  appendectomy. 3. Stable right adnexal cysts. No follow-up imaging is recommended. This recommendation follows ACR consensus guidelines: White Paper of the ACR Incidental Findings Committee II on Adnexal Findings. J Am Coll Radiol [phone].     Electronically Signed   By: Aram Candela M.D.   On: 10/26/2022 20:12 _________   Same findings on a CT abdomen and pelvis 2 days prior.  Assessment: No diagnosis found.  ***  Plan:  ***  Thank you for the courtesy of this consult.  Please call me with any questions or concerns.  Charlie Pitter III  CC: Referring provider noted above

## 2022-11-25 ENCOUNTER — Ambulatory Visit
Admission: RE | Admit: 2022-11-25 | Discharge: 2022-11-25 | Disposition: A | Discharge status: Home / Self Care | Payer: Medicaid Other | Source: Ambulatory Visit | Attending: Family Medicine | Admitting: Family Medicine

## 2022-11-25 ENCOUNTER — Other Ambulatory Visit: Payer: Self-pay | Admitting: Family Medicine

## 2022-11-25 DIAGNOSIS — R921 Mammographic calcification found on diagnostic imaging of breast: Secondary | ICD-10-CM

## 2022-11-25 DIAGNOSIS — R928 Other abnormal and inconclusive findings on diagnostic imaging of breast: Secondary | ICD-10-CM

## 2022-12-08 ENCOUNTER — Ambulatory Visit
Admission: RE | Admit: 2022-12-08 | Discharge: 2022-12-08 | Disposition: A | Discharge status: Home / Self Care | Payer: Medicaid Other | Source: Ambulatory Visit | Attending: Family Medicine | Admitting: Family Medicine

## 2022-12-08 DIAGNOSIS — N6011 Diffuse cystic mastopathy of right breast: Secondary | ICD-10-CM | POA: Diagnosis not present

## 2022-12-08 DIAGNOSIS — R921 Mammographic calcification found on diagnostic imaging of breast: Secondary | ICD-10-CM

## 2022-12-08 DIAGNOSIS — N6012 Diffuse cystic mastopathy of left breast: Secondary | ICD-10-CM | POA: Diagnosis not present

## 2022-12-08 DIAGNOSIS — D242 Benign neoplasm of left breast: Secondary | ICD-10-CM | POA: Diagnosis not present

## 2022-12-08 HISTORY — PX: BREAST BIOPSY: SHX20

## 2022-12-10 LAB — SURGICAL PATHOLOGY

## 2022-12-14 DIAGNOSIS — I129 Hypertensive chronic kidney disease with stage 1 through stage 4 chronic kidney disease, or unspecified chronic kidney disease: Secondary | ICD-10-CM | POA: Diagnosis not present

## 2022-12-14 DIAGNOSIS — N184 Chronic kidney disease, stage 4 (severe): Secondary | ICD-10-CM | POA: Diagnosis not present

## 2022-12-14 DIAGNOSIS — R252 Cramp and spasm: Secondary | ICD-10-CM | POA: Diagnosis not present

## 2022-12-14 DIAGNOSIS — R053 Chronic cough: Secondary | ICD-10-CM | POA: Diagnosis not present

## 2022-12-15 LAB — LAB REPORT - SCANNED: Creatinine, POC: 48.6 mg/dL

## 2022-12-18 ENCOUNTER — Other Ambulatory Visit: Payer: Self-pay | Admitting: Family Medicine

## 2022-12-18 NOTE — Progress Notes (Signed)
Discussed findings of mammogram and biopsy with patient in person when she came to her daughter's appointment. She does not have further questions. She had some minor discomfort near biopsy sites, but no significant edema, fluctuance, erythema.   I told patient if discomfort does not improve and if there are further skin changes that she should definitely be seen

## 2022-12-21 ENCOUNTER — Other Ambulatory Visit: Payer: Self-pay | Admitting: Family Medicine

## 2022-12-21 DIAGNOSIS — R053 Chronic cough: Secondary | ICD-10-CM

## 2023-01-13 ENCOUNTER — Other Ambulatory Visit: Payer: Self-pay

## 2023-01-13 ENCOUNTER — Encounter (HOSPITAL_COMMUNITY): Payer: Self-pay | Admitting: Emergency Medicine

## 2023-01-13 ENCOUNTER — Emergency Department (HOSPITAL_COMMUNITY): Payer: Medicaid Other

## 2023-01-13 ENCOUNTER — Emergency Department (HOSPITAL_COMMUNITY)
Admission: EM | Admit: 2023-01-13 | Discharge: 2023-01-14 | Disposition: A | Discharge status: Home / Self Care | Payer: Medicaid Other | Attending: Emergency Medicine | Admitting: Emergency Medicine

## 2023-01-13 DIAGNOSIS — N189 Chronic kidney disease, unspecified: Secondary | ICD-10-CM | POA: Diagnosis not present

## 2023-01-13 DIAGNOSIS — R079 Chest pain, unspecified: Secondary | ICD-10-CM | POA: Diagnosis not present

## 2023-01-13 DIAGNOSIS — R29818 Other symptoms and signs involving the nervous system: Secondary | ICD-10-CM | POA: Diagnosis not present

## 2023-01-13 DIAGNOSIS — I129 Hypertensive chronic kidney disease with stage 1 through stage 4 chronic kidney disease, or unspecified chronic kidney disease: Secondary | ICD-10-CM | POA: Insufficient documentation

## 2023-01-13 DIAGNOSIS — Z79899 Other long term (current) drug therapy: Secondary | ICD-10-CM | POA: Diagnosis not present

## 2023-01-13 DIAGNOSIS — R0789 Other chest pain: Secondary | ICD-10-CM | POA: Insufficient documentation

## 2023-01-13 DIAGNOSIS — I6782 Cerebral ischemia: Secondary | ICD-10-CM | POA: Diagnosis not present

## 2023-01-13 NOTE — ED Triage Notes (Signed)
 Pt complains of chest pain started 1 hour ago. Numbness in left arm and left leg started yesterday. BP has been for several days. Has been compliant with medication.

## 2023-01-13 NOTE — ED Provider Triage Note (Signed)
 Emergency Medicine Provider Triage Evaluation Note  Zoe Woodard , a 43 y.o. female  was evaluated in triage.  Pt complains of 24 h left arm numbness, new CP taht radiates to the left jaw, new LLE numbness. No alleviating or aggravating factors, no hx of same. .  Review of Systems  Positive: HTN, as bove Negative: Syncope, SOB, edema  Physical Exam  BP (!) 166/101   Pulse 98   Temp 98.6 F (37 C) (Oral)   Resp 18   Wt 82.6 kg   SpO2 100%   BMI 32.26 kg/m  Gen:   Awake, no distress   Resp:  Normal effort  MSK:   Moves extremities without difficulty  Other:  No facial asymmetry, CN intact. Subjective LUE and LLE numbness.    Medical Decision Making  Medically screening exam initiated at 11:53 PM.  Appropriate orders placed.  Zoe Woodard was informed that the remainder of the evaluation will be completed by another provider, this initial triage assessment does not replace that evaluation, and the importance of remaining in the ED until their evaluation is complete.  Patient outside the window for code stroke, >24 h since onset of LUE weakness. No sign of  LVO on exam.   This chart was dictated using voice recognition software, Dragon. Despite the best efforts of this provider to proofread and correct errors, errors may still occur which can change documentation meaning.    Zoe Pleasant SAUNDERS, PA-C 01/13/23 [id number]

## 2023-01-14 ENCOUNTER — Emergency Department (HOSPITAL_COMMUNITY): Payer: Medicaid Other

## 2023-01-14 DIAGNOSIS — I6782 Cerebral ischemia: Secondary | ICD-10-CM | POA: Diagnosis not present

## 2023-01-14 DIAGNOSIS — R29818 Other symptoms and signs involving the nervous system: Secondary | ICD-10-CM | POA: Diagnosis not present

## 2023-01-14 LAB — URINALYSIS, ROUTINE W REFLEX MICROSCOPIC
Bilirubin Urine: NEGATIVE
Glucose, UA: NEGATIVE mg/dL
Hgb urine dipstick: NEGATIVE
Ketones, ur: NEGATIVE mg/dL
Leukocytes,Ua: NEGATIVE
Nitrite: NEGATIVE
Protein, ur: 100 mg/dL — AB
Specific Gravity, Urine: 1.008 (ref 1.005–1.030)
pH: 6 (ref 5.0–8.0)

## 2023-01-14 LAB — HEPATIC FUNCTION PANEL
ALT: 13 U/L (ref 0–44)
AST: 15 U/L (ref 15–41)
Albumin: 3.1 g/dL — ABNORMAL LOW (ref 3.5–5.0)
Alkaline Phosphatase: 107 U/L (ref 38–126)
Bilirubin, Direct: <0.1 mg/dL (ref 0.0–0.2)
Total Bilirubin: 0.5 mg/dL (ref 0.0–1.2)
Total Protein: 6.7 g/dL (ref 6.5–8.1)

## 2023-01-14 LAB — CBC
HCT: 35.4 % — ABNORMAL LOW (ref 36.0–46.0)
Hemoglobin: 11.5 g/dL — ABNORMAL LOW (ref 12.0–15.0)
MCH: 27 pg (ref 26.0–34.0)
MCHC: 32.5 g/dL (ref 30.0–36.0)
MCV: 83.1 fL (ref 80.0–100.0)
Platelets: 422 10*3/uL — ABNORMAL HIGH (ref 150–400)
RBC: 4.26 MIL/uL (ref 3.87–5.11)
RDW: 13.5 % (ref 11.5–15.5)
WBC: 17.6 10*3/uL — ABNORMAL HIGH (ref 4.0–10.5)
nRBC: 0 % (ref 0.0–0.2)

## 2023-01-14 LAB — TROPONIN I (HIGH SENSITIVITY)
Troponin I (High Sensitivity): 3 ng/L (ref <18)
Troponin I (High Sensitivity): 3 ng/L (ref <18)

## 2023-01-14 LAB — BASIC METABOLIC PANEL
Anion gap: 10 (ref 5–15)
BUN: 29 mg/dL — ABNORMAL HIGH (ref 6–20)
CO2: 19 mmol/L — ABNORMAL LOW (ref 22–32)
Calcium: 8.4 mg/dL — ABNORMAL LOW (ref 8.9–10.3)
Chloride: 107 mmol/L (ref 98–111)
Creatinine, Ser: 2.55 mg/dL — ABNORMAL HIGH (ref 0.44–1.00)
GFR, Estimated: 23 mL/min — ABNORMAL LOW (ref >60)
Glucose, Bld: 113 mg/dL — ABNORMAL HIGH (ref 70–99)
Potassium: 4.5 mmol/L (ref 3.5–5.1)
Sodium: 136 mmol/L (ref 135–145)

## 2023-01-14 MED ORDER — ACETAMINOPHEN 500 MG PO TABS
1000.0000 mg | ORAL_TABLET | Freq: Once | ORAL | Status: AC
  Administered 2023-01-14: 1000 mg via ORAL
  Filled 2023-01-14: qty 2

## 2023-01-14 MED ORDER — FAMOTIDINE 20 MG PO TABS
20.0000 mg | ORAL_TABLET | Freq: Once | ORAL | Status: AC
  Administered 2023-01-14: 20 mg via ORAL
  Filled 2023-01-14: qty 1

## 2023-01-14 NOTE — Discharge Instructions (Signed)
 You were seen today for chest pain and numbness.  Your MRI, CT scan and lab work are reassuring.  I recommend he follow close with your regular doctor as well as call neurology above to schedule follow-up.  If you develop recurrent chest pain, difficulty breathing, worsening weakness or numbness or vision changes you should return to the ED.

## 2023-01-14 NOTE — ED Provider Notes (Signed)
 [facility] Provider Note   CSN: [id number] Arrival date & time: 01/13/23  2307     History  Chief Complaint  Patient presents with   Chest Pain    Zoe Woodard is a 43 y.o. female.   Chest Pain 43 year old female history of CKD, hyperlipidemia, hypertension presenting for chest pain and left hand numbness.  Symptoms started on Wednesday.  She was watching TV when she developed numbness to the palmar aspect of her left hand and forearm.  It has slightly improved but is still present.  No weakness, no pain.  No trauma and she did not sit on it or position abnormally.  Yesterday she developed chest pain.  She was laying down in bed when she developed some crampy substernal chest pain.  Nonradiating.  Does not seem to be associated with the numbness.  Yesterday she also had some numbness in her left foot briefly which has also resolved.  Chest pain is currently much improved.  No nausea or vomiting or diaphoresis.  No history of ACS or stroke.  No abdominal pain.  She has been here for 11 hours without significant change in her symptoms although numbness in her hand is still improving.  No neck pain no radicular pain.     Home Medications Prior to Admission medications   Medication Sig Start Date End Date Taking? Authorizing Provider  acetaminophen  (TYLENOL ) 500 MG tablet Take 1,000 mg by mouth every 6 (six) hours as needed for mild pain (pain score 1-3) or moderate pain (pain score 4-6).   Yes [provider]  Ascorbic Acid (VITAMIN C PO) Take 1 tablet by mouth daily.   Yes [provider]  dicyclomine  (BENTYL ) 20 MG tablet Take 1 tablet (20 mg total) by mouth 3 (three) times daily before meals. Patient taking differently: Take 20 mg by mouth 3 (three) times daily as needed for spasms. 10/29/22  Yes Nicholas Bar, MD  famotidine  (PEPCID ) 20 MG tablet TAKE 1 TABLET BY MOUTH TWICE A DAY 12/22/22  Yes Nicholas Bar, MD   loratadine  (CLARITIN ) 10 MG tablet Take 10 mg by mouth daily. 12/22/22  Yes [provider]  losartan  (COZAAR ) 50 MG tablet Take 50 mg by mouth at bedtime. 09/10/20  Yes [provider]  metoprolol  tartrate (LOPRESSOR ) 25 MG tablet TAKE 1 TABLET BY MOUTH TWICE A DAY Patient taking differently: Take 25 mg by mouth daily. 10/12/22 01/14/23 Yes Nicholas Bar, MD  montelukast  (SINGULAIR ) 10 MG tablet TAKE 1 TABLET BY MOUTH EVERYDAY AT BEDTIME 08/18/22  Yes Nicholas Bar, MD  ondansetron  (ZOFRAN -ODT) 4 MG disintegrating tablet Take 1 tablet (4 mg total) by mouth every 8 (eight) hours as needed. 10/26/22  Yes Henderly, Britni A, PA-C  Vitamin D , Ergocalciferol , (DRISDOL ) 1.25 MG (50000 UNIT) CAPS capsule TAKE 1 CAPSULE BY MOUTH ONE TIME PER WEEK 11/10/22  Yes Nicholas Bar, MD      Allergies    Patient has no known allergies.    Review of Systems   Review of Systems  Cardiovascular:  Positive for chest pain.  Review of systems completed and notable as per HPI.  ROS otherwise negative.   Physical Exam Updated Vital Signs BP 137/82   Pulse 88   Temp 98.2 F (36.8 C) (Oral)   Resp 18   Ht 5' 3 (1.6 m)   Wt 82.6 kg   SpO2 99%   BMI 32.24 kg/m  Physical Exam Vitals and nursing note reviewed.  Constitutional:      General: She is not in acute distress.    Appearance: She is well-developed.  HENT:     Head: Normocephalic and atraumatic.     Mouth/Throat:     Mouth: Mucous membranes are moist.     Pharynx: Oropharynx is clear.  Eyes:     Extraocular Movements: Extraocular movements intact.     Conjunctiva/sclera: Conjunctivae normal.     Pupils: Pupils are equal, round, and reactive to light.  Cardiovascular:     Rate and Rhythm: Normal rate and regular rhythm.     Pulses: Normal pulses.     Heart sounds: Normal heart sounds. No murmur heard. Pulmonary:     Effort: Pulmonary effort is normal. No respiratory distress.     Breath sounds: Normal breath sounds.   Abdominal:     Palpations: Abdomen is soft.     Tenderness: There is no abdominal tenderness.  Musculoskeletal:        General: No swelling.     Cervical back: Normal range of motion and neck supple. No rigidity or tenderness.     Right lower leg: No edema.     Left lower leg: No edema.  Skin:    General: Skin is warm and dry.     Capillary Refill: Capillary refill takes less than 2 seconds.  Neurological:     Mental Status: She is alert and oriented to person, place, and time.     Cranial Nerves: No cranial nerve deficit.     Motor: No weakness.     Coordination: Coordination normal.     Comments: Awake and alert.  Oriented to person, time, situation.  Normal speech.  Cranial nerves are intact.  No visual field cuts.  Normal finger-to-nose bilaterally.  Normal strength in all extremities.  Sensation is normal aside from subjectively decrease sensation in the palmar aspect of the left hand and forearm.  2+ radial pulse.  No skin changes.  Normal strength here.  Psychiatric:        Mood and Affect: Mood normal.     ED Results / Procedures / Treatments   Labs (all labs ordered are listed, but only abnormal results are displayed) Labs Reviewed  BASIC METABOLIC PANEL - Abnormal; Notable for the following components:      Result Value   CO2 19 (*)    Glucose, Bld 113 (*)    BUN 29 (*)    Creatinine, Ser 2.55 (*)    Calcium  8.4 (*)    GFR, Estimated 23 (*)    All other components within normal limits  CBC - Abnormal; Notable for the following components:   WBC 17.6 (*)    Hemoglobin 11.5 (*)    HCT 35.4 (*)    Platelets 422 (*)    All other components within normal limits  HEPATIC FUNCTION PANEL - Abnormal; Notable for the following components:   Albumin 3.1 (*)    All other components within normal limits  URINALYSIS, ROUTINE W REFLEX MICROSCOPIC - Abnormal; Notable for the following components:   Color, Urine STRAW (*)    Protein, ur 100 (*)    Bacteria, UA RARE (*)     All other components within normal limits  TROPONIN I (HIGH SENSITIVITY)  TROPONIN I (HIGH SENSITIVITY)    EKG EKG Interpretation Date/Time:  Thursday January 13 2023 23:16:12 EST Ventricular Rate:  98 PR Interval:  130 QRS Duration:  66 QT Interval:  340 QTC Calculation: 434 R Axis:  27  Text Interpretation: Normal sinus rhythm Normal ECG When compared with ECG of 26-Oct-2022 16:54, PREVIOUS ECG IS PRESENT Confirmed by Kohlton Gilpatrick [phone]) on 01/14/2023 9:44:17 AM  Radiology MR Brain Wo Contrast (neuro protocol) Result Date: 01/14/2023 CLINICAL DATA:  Neuro deficit, acute, stroke suspected EXAM: MRI HEAD WITHOUT CONTRAST TECHNIQUE: Multiplanar, multiecho pulse sequences of the brain and surrounding structures were obtained without intravenous contrast. COMPARISON:  Same-day head CT FINDINGS: Brain: Negative for an acute infarct. No hemorrhage. No hydrocephalus. No extra-axial fluid collection. No mass effect. No mass lesion. Background of mild chronic microvascular ischemic change Vascular: Normal flow voids. Skull and upper cervical spine: Normal marrow signal. Sinuses/Orbits: No middle ear or mastoid effusion. Paranasal sinuses are clear. Orbits are unremarkable. Other: None. IMPRESSION: No acute intracranial process. Electronically Signed   By: Lyndall Gore M.D.   On: 01/14/2023 15:06   CT HEAD WO CONTRAST ( ) Result Date: 01/14/2023 CLINICAL DATA:  Hypertensive emergency EXAM: CT HEAD WITHOUT CONTRAST TECHNIQUE: Contiguous axial images were obtained from the base of the skull through the vertex without intravenous contrast. RADIATION DOSE REDUCTION: This exam was performed according to the departmental dose-optimization program which includes automated exposure control, adjustment of the mA and/or kV according to patient size and/or use of iterative reconstruction technique. COMPARISON:  None Available. FINDINGS: Brain: No intracranial hemorrhage, mass effect, or midline shift. No  hydrocephalus. The basilar cisterns are patent. No evidence of territorial infarct or acute ischemia. No extra-axial or intracranial fluid collection. Vascular: No hyperdense vessel. Skull: Normal. Negative for fracture or focal lesion. Sinuses/Orbits: Paranasal sinuses and mastoid air cells are clear. The visualized orbits are unremarkable. Other: None. IMPRESSION: Normal head CT. Electronically Signed   By: Andrea Gasman M.D.   On: 01/14/2023 01:08   DG Chest 2 View Result Date: 01/14/2023 CLINICAL DATA:  Chest pain EXAM: CHEST - 2 VIEW COMPARISON:  10/24/2022 FINDINGS: The heart size and mediastinal contours are within normal limits. Both lungs are clear. The visualized skeletal structures are unremarkable. IMPRESSION: No active cardiopulmonary disease. Electronically Signed   By: Oneil Devonshire M.D.   On: 01/14/2023 00:08    Procedures Procedures    Medications Ordered in ED Medications  acetaminophen  (TYLENOL ) tablet 1,000 mg (1,000 mg Oral Given 01/14/23 1016)  famotidine  (PEPCID ) tablet 20 mg (20 mg Oral Given 01/14/23 1016)    ED Course/ Medical Decision Making/ A&P                                 Medical Decision Making Amount and/or Complexity of Data Reviewed Labs: ordered. Radiology: ordered.  Risk OTC drugs.   Medical Decision Making:   ORIS CALMES is a 43 y.o. female who presented to the ED today with chest pain and left hand numbness.  Vital signs reviewed.  Her symptoms seem to be separate.  Regarding her chest pain, it is atypical, started with laying down.  She has had 2 negative troponins and nonischemic EKG.  I think she is low risk for ACS based on her atypical pain and reassuring workup.  I think she can follow-up outpatient for this.  I have low suspicion for PE, dissection based on history and exam.  Regarding her left hand numbness, I am concerned for possible stroke/TIA.  CT head is reassuring.  She is well outside of TNK window.  I think she will need MRI  imaging.  She cannot get contrast due  to her kidney function, will discuss imaging recommendations with neurology.  She is no radicular pain or neck pain I have low suspicion for C-spine cause.   Patient placed on continuous vitals and telemetry monitoring while in ED which was reviewed periodically.  Reviewed and confirmed nursing documentation for past medical history, family history, social history.  Reassessment and Plan:   I did discuss with neurology imaging.  Unfortunately cannot do contrast due to her renal function, recommends noncontrast MRI and outpatient follow-up if normal.  MRI does not show any acute infarct or other acute abnormality.  Patient symptoms are essentially resolved.  She has had no recurrent chest pain.  Lab work otherwise notable for baseline renal function, leukocytosis which appears to be close to her baseline as well.  I think she can follow-up safely with neurology and cardiology.  I gave her strict return precautions and recommend she call her PCP today to schedule close follow-up.  She is comfortable with this plan.  Discharged in stable condition.   Patient's presentation is most consistent with acute complicated illness / injury requiring diagnostic workup.           Final Clinical Impression(s) / ED Diagnoses Final diagnoses:  Atypical chest pain    Rx / DC Orders ED Discharge Orders     None         Nicholaus Cassondra DEL, MD 01/14/23 [phone]

## 2023-02-12 ENCOUNTER — Other Ambulatory Visit: Payer: Self-pay | Admitting: Family Medicine

## 2023-02-12 DIAGNOSIS — R053 Chronic cough: Secondary | ICD-10-CM

## 2023-02-14 ENCOUNTER — Other Ambulatory Visit: Payer: Self-pay | Admitting: Family Medicine

## 2023-02-14 ENCOUNTER — Encounter: Payer: Self-pay | Admitting: Family Medicine

## 2023-02-14 DIAGNOSIS — R921 Mammographic calcification found on diagnostic imaging of breast: Secondary | ICD-10-CM

## 2023-03-01 ENCOUNTER — Telehealth: Payer: Self-pay | Admitting: Hematology and Oncology

## 2023-03-01 NOTE — Telephone Encounter (Signed)
 Zoe Woodard

## 2023-03-04 ENCOUNTER — Ambulatory Visit: Payer: Medicaid Other | Admitting: Hematology and Oncology

## 2023-04-01 ENCOUNTER — Inpatient Hospital Stay: Payer: Medicaid Other | Attending: Hematology and Oncology | Admitting: Hematology and Oncology

## 2023-04-01 ENCOUNTER — Inpatient Hospital Stay

## 2023-04-01 ENCOUNTER — Ambulatory Visit: Payer: Medicaid Other | Admitting: Hematology and Oncology

## 2023-04-01 VITALS — BP 114/86 | HR 70 | Temp 98.2°F | Resp 16 | Wt 167.6 lb

## 2023-04-01 DIAGNOSIS — D72829 Elevated white blood cell count, unspecified: Secondary | ICD-10-CM | POA: Diagnosis present

## 2023-04-01 DIAGNOSIS — D72828 Other elevated white blood cell count: Secondary | ICD-10-CM

## 2023-04-01 DIAGNOSIS — D649 Anemia, unspecified: Secondary | ICD-10-CM | POA: Insufficient documentation

## 2023-04-01 DIAGNOSIS — Z79899 Other long term (current) drug therapy: Secondary | ICD-10-CM | POA: Diagnosis not present

## 2023-04-01 LAB — CBC WITH DIFFERENTIAL/PLATELET
Abs Immature Granulocytes: 0.04 10*3/uL (ref 0.00–0.07)
Basophils Absolute: 0.1 10*3/uL (ref 0.0–0.1)
Basophils Relative: 0 %
Eosinophils Absolute: 0.1 10*3/uL (ref 0.0–0.5)
Eosinophils Relative: 1 %
HCT: 36.4 % (ref 36.0–46.0)
Hemoglobin: 11.9 g/dL — ABNORMAL LOW (ref 12.0–15.0)
Immature Granulocytes: 0 %
Lymphocytes Relative: 33 %
Lymphs Abs: 3.8 10*3/uL (ref 0.7–4.0)
MCH: 27.1 pg (ref 26.0–34.0)
MCHC: 32.7 g/dL (ref 30.0–36.0)
MCV: 82.9 fL (ref 80.0–100.0)
Monocytes Absolute: 0.7 10*3/uL (ref 0.1–1.0)
Monocytes Relative: 6 %
Neutro Abs: 7.1 10*3/uL (ref 1.7–7.7)
Neutrophils Relative %: 60 %
Platelets: 377 10*3/uL (ref 150–400)
RBC: 4.39 MIL/uL (ref 3.87–5.11)
RDW: 13.3 % (ref 11.5–15.5)
WBC: 11.8 10*3/uL — ABNORMAL HIGH (ref 4.0–10.5)
nRBC: 0 % (ref 0.0–0.2)

## 2023-04-01 LAB — VITAMIN B12: Vitamin B-12: 355 pg/mL (ref 180–914)

## 2023-04-01 NOTE — Progress Notes (Signed)
[facility]  PROGRESS NOTE  Patient Care Team: Lockie Mola, MD as PCP - General (Family Medicine)  CHIEF COMPLAINTS/PURPOSE OF CONSULTATION:  Leukocytosis   SUMMARY OF HEMATOLOGIC HISTORY:   Referred for elevated WBC, noted below.    Latest Reference Range & Units 02/21/20 10:51 03/14/20 10:13 06/25/20 11:21 08/11/20 10:37 11/19/20 14:18 02/26/21 11:18 03/02/21 08:41  WBC 4.0 - 10.5 K/uL 15.8 (H) 15.2 (H) 16.4 (H) 12.1 (H) 17.3 (H) 15.4 (H) 13.3 (H)  (H): Data is abnormally high   2. 03/14/2020: [employer] NGS JAK3, MPL, CALR, Reflex to MPN Panel report: Negative.  DNA analyzed in JAK2 V617F, JAK2 exon 12, MPL, CALR, ABL1, ASXL1, CALR, CBL, CSF3R, DNMT3A, EZH2, IDH1, IDH2, JAK2, KIT, MPL, SETBP1, SF3B1, SRSF2, TP53, U2AF1   3. 11/2020: elevated CRP and ESR, referred to rheumatology,    4.  Rheumatology evaluation: RF negative, anti-CCP negative, ANA negative, ACE negative.  Elbow pain attributed to lateral epicondylitis bilaterally.  Bilateral chronic knee pain attributed to chronic arthritis and questionable spurring.  Myalgias attributed to statin.   5. 03/02/2021: Bone marrow biopsy and aspirate shows slightly hypercellular marrow at 60 to 70% cellularity with granulocytic hyperplasia.  No myeloid neoplasm was identified.  Cytogenetics were sent on the sample and were normal.    INTERVAL HISTORY:   Zoe Woodard is a 43 year old female who presents for follow-up after a breast biopsy and leukocytosis.  In November, she underwent a breast biopsy which revealed a small intraductal papilloma. She has a 6 month FU mammogram and [place] scheduled. She has a history of elevated white blood cell count, with the last count in January showing similar results. The cause of the leukocytosis remains undetermined, and she is currently awaiting today's blood test results. No associated symptoms such as fever, night sweats, or recent infections. No significant changes in appetite or  weight. Breathing is stable, and there are no issues with bowel movements or urination.  Rest of the pertinent 10 point ROS reviewed and negative.    MEDICAL HISTORY:  Past Medical History:  Diagnosis Date   Abdominal pain 12/28/2019   Back pain 10/10/2019   Chronic kidney disease    Ear pain, bilateral 04/15/2020   Hyperlipidemia    Hypertensive urgency 07/22/2019   Hypocalcemia 08/22/2019   Kidney disease    stage 4   Low blood pressure 02/07/2020   Obesity    Pyelonephritis 11/08/2019    SURGICAL HISTORY: Past Surgical History:  Procedure Laterality Date   ABDOMINAL HYSTERECTOMY     APPENDECTOMY     bladder laceration  01/04/2015   BREAST BIOPSY Left 12/08/2022   MM LT BREAST BX W LOC DEV 1ST LESION IMAGE BX SPEC STEREO GUIDE 12/08/2022 [facility]   BREAST BIOPSY Right 12/08/2022   MM RT BREAST BX W LOC DEV 1ST LESION IMAGE BX SPEC STEREO GUIDE 12/08/2022 [facility]   excision of thyroglobal duct cyst  02/01/2012   right ovarian cystoectomy  01/02/2015   right ovarian egstectomy  04/27/2013   SPINE SURGERY     TONSILLECTOMY     TUBAL LIGATION      SOCIAL HISTORY: Social History   Socioeconomic History   Marital status: Single    Spouse name: Not on file   Number of children: 3   Years of education: Not on file   Highest education level: Not on file  Occupational History   Occupation: home maker  Tobacco Use   Smoking status: Never  Smokeless tobacco: Never  Vaping Use   Vaping status: Never Used  Substance and Sexual Activity   Alcohol use: No   Drug use: No   Sexual activity: Not Currently    Partners: Male    Birth control/protection: Surgical  Other Topics Concern   Not on file  Social History Narrative   Lives with 3 children    Not working currently   Not sexually active.    Social Drivers of Corporate investment banker Strain: Not on file  Food Insecurity: Not on file  Transportation Needs: Not on file  Physical  Activity: Not on file  Stress: Not on file  Social Connections: Unknown (05/19/2021)   [facility]   Social Network    Social Network: Not on file  Intimate Partner Violence: Unknown (04/15/2021)   Received from [facility]   HITS    Physically Hurt: Not on file    Insult or Talk Down To: Not on file    Threaten Physical Harm: Not on file    Scream or Curse: Not on file    FAMILY HISTORY: Family History  Problem Relation Age of Onset   Diabetes Mother    Healthy Daughter    Healthy Daughter    Healthy Son    Colon cancer Neg Hx    Esophageal cancer Neg Hx    Stomach cancer Neg Hx     ALLERGIES:  has no known allergies.  MEDICATIONS:  Current Outpatient Medications  Medication Sig Dispense Refill   acetaminophen (TYLENOL) 500 MG tablet Take 1,000 mg by mouth every 6 (six) hours as needed for mild pain (pain score 1-3) or moderate pain (pain score 4-6).     Ascorbic Acid (VITAMIN C PO) Take 1 tablet by mouth daily.     dicyclomine (BENTYL) 20 MG tablet Take 1 tablet (20 mg total) by mouth 3 (three) times daily before meals. (Patient taking differently: Take 20 mg by mouth 3 (three) times daily as needed for spasms.) 21 tablet 0   famotidine (PEPCID) 20 MG tablet TAKE 1 TABLET BY MOUTH TWICE A DAY 180 tablet 1   loratadine (CLARITIN) 10 MG tablet Take 10 mg by mouth daily.     losartan (COZAAR) 50 MG tablet Take 50 mg by mouth at bedtime.     metoprolol tartrate (LOPRESSOR) 25 MG tablet TAKE 1 TABLET BY MOUTH TWICE A DAY (Patient taking differently: Take 25 mg by mouth daily.) 180 tablet 1   montelukast (SINGULAIR) 10 MG tablet TAKE 1 TABLET BY MOUTH EVERYDAY AT BEDTIME 90 tablet 1   ondansetron (ZOFRAN-ODT) 4 MG disintegrating tablet Take 1 tablet (4 mg total) by mouth every 8 (eight) hours as needed. 20 tablet 0   Vitamin D, Ergocalciferol, (DRISDOL) 1.25 MG (50000 UNIT) CAPS capsule TAKE 1 CAPSULE BY MOUTH ONE TIME PER WEEK 12  capsule 1   No current facility-administered medications for this visit.     PHYSICAL EXAMINATION: ECOG PERFORMANCE STATUS: 0 - Asymptomatic  Vitals:   04/01/23 1208  BP: 114/86  Pulse: 70  Resp: 16  Temp: 98.2 F (36.8 C)  SpO2: 100%     Filed Weights   04/01/23 1208  Weight: 167 lb 9.6 oz (76 kg)    Physical Exam Constitutional:      Appearance: Normal appearance. She is not ill-appearing.  HENT:     Head: Normocephalic and atraumatic.     Mouth/Throat:     Mouth: Mucous  membranes are moist.     Pharynx: Oropharynx is clear.  Eyes:     General: No scleral icterus.    Extraocular Movements: Extraocular movements intact.  Cardiovascular:     Rate and Rhythm: Normal rate and regular rhythm.  Pulmonary:     Effort: Pulmonary effort is normal.     Breath sounds: Normal breath sounds.  Abdominal:     General: Abdomen is flat. Bowel sounds are normal. There is no distension.     Tenderness: There is no abdominal tenderness.  Musculoskeletal:        General: No swelling or tenderness.     Cervical back: Normal range of motion and neck supple.     Right lower leg: No edema.  Skin:    General: Skin is warm and dry.     Coloration: Skin is not jaundiced.  Neurological:     General: No focal deficit present.     Mental Status: She is alert and oriented to person, place, and time.  Psychiatric:        Mood and Affect: Mood normal.      LABORATORY DATA:  I have reviewed the data as listed Lab Results  Component Value Date   WBC 11.8 (H) 04/01/2023   HGB 11.9 (L) 04/01/2023   HCT 36.4 04/01/2023   MCV 82.9 04/01/2023   PLT 377 04/01/2023     Chemistry      Component Value Date/Time   NA 136 01/13/2023 2322   NA 139 02/07/2020 1116   K 4.5 01/13/2023 2322   CL 107 01/13/2023 2322   CO2 19 (L) 01/13/2023 2322   BUN 29 (H) 01/13/2023 2322   BUN 27 (H) 02/07/2020 1116   CREATININE 2.55 (H) 01/13/2023 2322   CREATININE 1.99 (H) 03/01/2022 1058       Component Value Date/Time   CALCIUM 8.4 (L) 01/13/2023 2322   ALKPHOS 107 01/14/2023 0154   AST 15 01/14/2023 0154   AST 16 03/01/2022 1058   ALT 13 01/14/2023 0154   ALT 18 03/01/2022 1058   BILITOT 0.5 01/14/2023 0154   BILITOT 0.3 03/01/2022 1058     I have reviewed pertinent labs from last visit.  We will repeat CBC, CMP and flow today.  RADIOGRAPHIC STUDIES: I have personally reviewed the radiological images as listed and agreed with the findings in the report. No results found.   ASSESSMENT & PLAN:   Zoe Woodard is a 43 y.o. female who presents to the clinic for evaluation for leukocytosis.  Leukocytosis, neutrophil predominant: --Workup so far has included MPN panel and BCR/ABL FISH that was negative.  -- She has followed up with rheumatology, negative work-up so far.   --- Bone marrow biopsy with no clear explanation for the leukocytosis and thrombocytosis. --She remains asymptomatic.  Labs today mild leukocytosis. Ok to monitor with annual labs and FU as needed. She is agreeable to all the recommendations.  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 - Schedule mammogram and ultrasound on May 28th to monitor for changes.  Vitamin B12 supplementation Currently taking supplements. B12 levels to be checked for normal range. - Check Vitamin B12 levels.  Follow-up Annual follow-up scheduled unless new concerns arise. - Schedule follow-up visit in one year unless needed sooner.   All questions were answered. The patient knows to call the clinic with any problems, questions or concerns.   Burnice Logan Lain[facility] P:  [phone]     

## 2023-04-28 DIAGNOSIS — I129 Hypertensive chronic kidney disease with stage 1 through stage 4 chronic kidney disease, or unspecified chronic kidney disease: Secondary | ICD-10-CM | POA: Diagnosis not present

## 2023-04-28 DIAGNOSIS — R053 Chronic cough: Secondary | ICD-10-CM | POA: Diagnosis not present

## 2023-04-28 DIAGNOSIS — R252 Cramp and spasm: Secondary | ICD-10-CM | POA: Diagnosis not present

## 2023-04-28 DIAGNOSIS — N184 Chronic kidney disease, stage 4 (severe): Secondary | ICD-10-CM | POA: Diagnosis not present

## 2023-05-02 LAB — LAB REPORT - SCANNED
Creatinine, POC: 142.4 mg/dL
EGFR: 22

## 2023-05-28 ENCOUNTER — Other Ambulatory Visit: Payer: Self-pay | Admitting: Family Medicine

## 2023-05-28 DIAGNOSIS — E559 Vitamin D deficiency, unspecified: Secondary | ICD-10-CM

## 2023-06-08 ENCOUNTER — Ambulatory Visit: Payer: Medicaid Other

## 2023-06-08 ENCOUNTER — Ambulatory Visit
Admission: RE | Admit: 2023-06-08 | Discharge: 2023-06-08 | Disposition: A | Discharge status: Home / Self Care | Payer: Medicaid Other | Source: Ambulatory Visit

## 2023-06-08 DIAGNOSIS — R928 Other abnormal and inconclusive findings on diagnostic imaging of breast: Secondary | ICD-10-CM | POA: Diagnosis not present

## 2023-06-08 DIAGNOSIS — R921 Mammographic calcification found on diagnostic imaging of breast: Secondary | ICD-10-CM

## 2023-06-17 ENCOUNTER — Other Ambulatory Visit: Payer: Self-pay | Admitting: Family Medicine

## 2023-06-17 DIAGNOSIS — R053 Chronic cough: Secondary | ICD-10-CM

## 2023-08-01 DIAGNOSIS — R053 Chronic cough: Secondary | ICD-10-CM | POA: Diagnosis not present

## 2023-08-01 DIAGNOSIS — N184 Chronic kidney disease, stage 4 (severe): Secondary | ICD-10-CM | POA: Diagnosis not present

## 2023-08-01 DIAGNOSIS — R252 Cramp and spasm: Secondary | ICD-10-CM | POA: Diagnosis not present

## 2023-08-01 DIAGNOSIS — I129 Hypertensive chronic kidney disease with stage 1 through stage 4 chronic kidney disease, or unspecified chronic kidney disease: Secondary | ICD-10-CM | POA: Diagnosis not present

## 2023-08-02 LAB — LAB REPORT - SCANNED
Creatinine, POC: 94.4 mg/dL
EGFR: 21

## 2023-08-04 ENCOUNTER — Encounter: Payer: Self-pay | Admitting: Nephrology

## 2023-08-26 ENCOUNTER — Other Ambulatory Visit: Payer: Self-pay | Admitting: Family Medicine

## 2023-08-26 DIAGNOSIS — R053 Chronic cough: Secondary | ICD-10-CM

## 2023-09-04 ENCOUNTER — Other Ambulatory Visit: Payer: Self-pay | Admitting: Family Medicine

## 2023-09-04 DIAGNOSIS — R053 Chronic cough: Secondary | ICD-10-CM

## 2023-09-07 DIAGNOSIS — N184 Chronic kidney disease, stage 4 (severe): Secondary | ICD-10-CM | POA: Diagnosis not present

## 2023-09-20 ENCOUNTER — Ambulatory Visit (INDEPENDENT_AMBULATORY_CARE_PROVIDER_SITE_OTHER)

## 2023-09-20 DIAGNOSIS — Z23 Encounter for immunization: Secondary | ICD-10-CM

## 2023-09-20 NOTE — Progress Notes (Signed)
 Patient presents to nurse clinic for flu vaccine.  Vaccine given without complication.  See admin for details.

## 2023-10-21 ENCOUNTER — Ambulatory Visit: Admitting: Family Medicine

## 2023-10-21 ENCOUNTER — Encounter: Payer: Self-pay | Admitting: Family Medicine

## 2023-10-21 ENCOUNTER — Ambulatory Visit: Payer: Self-pay | Admitting: Family Medicine

## 2023-10-21 VITALS — BP 118/81 | HR 69 | Wt 152.0 lb

## 2023-10-21 DIAGNOSIS — N184 Chronic kidney disease, stage 4 (severe): Secondary | ICD-10-CM | POA: Diagnosis not present

## 2023-10-21 DIAGNOSIS — R911 Solitary pulmonary nodule: Secondary | ICD-10-CM | POA: Diagnosis not present

## 2023-10-21 DIAGNOSIS — D72829 Elevated white blood cell count, unspecified: Secondary | ICD-10-CM | POA: Diagnosis not present

## 2023-10-21 DIAGNOSIS — I10 Essential (primary) hypertension: Secondary | ICD-10-CM

## 2023-10-21 DIAGNOSIS — Z23 Encounter for immunization: Secondary | ICD-10-CM | POA: Diagnosis not present

## 2023-10-21 DIAGNOSIS — Z131 Encounter for screening for diabetes mellitus: Secondary | ICD-10-CM | POA: Diagnosis not present

## 2023-10-21 DIAGNOSIS — Z0000 Encounter for general adult medical examination without abnormal findings: Secondary | ICD-10-CM | POA: Diagnosis not present

## 2023-10-21 LAB — POCT GLYCOSYLATED HEMOGLOBIN (HGB A1C): Hemoglobin A1C: 5.4 % (ref 4.0–5.6)

## 2023-10-21 NOTE — Patient Instructions (Addendum)
 It was wonderful to see you today.  Please bring ALL of your medications with you to every visit.   Today we talked about:  You are doing great! I am going to check your hemoglobin A1c to screen you for diabetes today. I will let you know the result over mychart  Please follow up in 1 year for annual physical or earlier if the need arises.   Thank you for choosing [facility].   Please call [phone] with any questions about today's appointment.  Please be sure to schedule follow up at the front desk before you leave today.   Areta Saliva, MD  Family Medicine

## 2023-10-21 NOTE — Progress Notes (Signed)
    SUBJECTIVE:   Chief compliant/HPI: annual examination  Zoe Woodard is a 43 y.o. who presents today for an annual exam.   History tabs reviewed and updated.   Review of systems form reviewed and notable for none.   OBJECTIVE:   BP 118/81   Pulse 69   Wt 152 lb (68.9 kg)   SpO2 100%   BMI 26.93 kg/m   General: well appearing, in no acute distress CV: RRR, radial pulses equal and palpable, no BLE edema  Resp: Normal work of breathing on room air, CTAB Abd: Soft, non tender, non distended  Neuro: Alert & Oriented    ASSESSMENT/PLAN:   Assessment & Plan Essential hypertension Well-controlled at this time. - Continue losartan  50 mg and metoprolol  25 mg. CKD (chronic kidney disease) stage 4, GFR 15-29 ml/min (HCC) Follows with [facility].  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  Creatinine has improved slightly with control of hypertension.  Has had recent labs with [facility]. - Continue to follow with nephrology. Leukocytosis, unspecified type Has had a lot of lab work to evaluate leukocytosis.  Is considered benign at this time.  Hematology recommends monitoring with annual labs and follow-up with heme-onc as needed has had recent labs with them no need for CBC at this time. Annual Examination  See AVS for age appropriate recommendations.   PHQ score 0, reviewed and discussed.  Blood pressure reviewed and at goal.  Asked about intimate partner violence and resources given as appropriate: no partner   Patient has had abdominal hysterectomy   Considered the following items based upon USPSTF recommendations: Diabetes screening: ordered HIV testing:Not high risk, not indicated Hepatitis C: recently completed and result reviewed, normal  Hepatitis B:discussed and declined Syphilis if at high risk: Not high risk, not indicated GC/CT not at high risk and not ordered. Lipid panel (nonfasting or fasting) discussed based upon AHA  recommendations and recently completed and repeat not yet indicated.  Can recheck next year.  Reviewed risk factors for latent tuberculosis and not indicated   Discussed family history, BRCA testing not indicated. Tool used to risk stratify was pedigree assessment tool.  Cervical cancer screening: not indicated given history of hysterectomy with prior normal cytology.  Breast cancer screening: recently completed and repeat not yet indicated patient has had some abnormalities in her breast cancer screenings however recent follow-up of left breast mammogram showed No mammographic evidence of malignancy in the LEFT breast with recommendation for bilateral annual screening mammogram due November 2025. Colorectal cancer screening: not applicable given age.  if age 37 or over.   Follow up in 1 year or sooner if indicated.  MyChart Activation: Already signed up  Areta Saliva, MD [facility]

## 2023-10-22 ENCOUNTER — Encounter: Payer: Self-pay | Admitting: Family Medicine

## 2023-10-22 NOTE — Assessment & Plan Note (Signed)
 Follows with [facility].  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  Creatinine has improved slightly with control of hypertension.  Has had recent labs with [facility]. - Continue to follow with nephrology.

## 2023-10-22 NOTE — Assessment & Plan Note (Signed)
 Well-controlled at this time. - Continue losartan  50 mg and metoprolol  25 mg.

## 2023-10-22 NOTE — Assessment & Plan Note (Signed)
 Has had a lot of lab work to evaluate leukocytosis.  Is considered benign at this time.  Hematology recommends monitoring with annual labs and follow-up with heme-onc as needed has had recent labs with them no need for CBC at this time.

## 2023-11-19 ENCOUNTER — Other Ambulatory Visit: Payer: Self-pay | Admitting: Student

## 2023-11-19 DIAGNOSIS — E559 Vitamin D deficiency, unspecified: Secondary | ICD-10-CM

## 2023-12-01 ENCOUNTER — Other Ambulatory Visit: Payer: Self-pay | Admitting: Family Medicine

## 2023-12-01 DIAGNOSIS — E785 Hyperlipidemia, unspecified: Secondary | ICD-10-CM | POA: Diagnosis not present

## 2023-12-01 DIAGNOSIS — D631 Anemia in chronic kidney disease: Secondary | ICD-10-CM | POA: Diagnosis not present

## 2023-12-01 DIAGNOSIS — I129 Hypertensive chronic kidney disease with stage 1 through stage 4 chronic kidney disease, or unspecified chronic kidney disease: Secondary | ICD-10-CM | POA: Diagnosis not present

## 2023-12-01 DIAGNOSIS — Z1231 Encounter for screening mammogram for malignant neoplasm of breast: Secondary | ICD-10-CM

## 2023-12-01 DIAGNOSIS — N2581 Secondary hyperparathyroidism of renal origin: Secondary | ICD-10-CM | POA: Diagnosis not present

## 2023-12-01 DIAGNOSIS — N189 Chronic kidney disease, unspecified: Secondary | ICD-10-CM | POA: Diagnosis not present

## 2023-12-02 ENCOUNTER — Other Ambulatory Visit: Payer: Self-pay | Admitting: Family Medicine

## 2023-12-02 DIAGNOSIS — I10 Essential (primary) hypertension: Secondary | ICD-10-CM

## 2023-12-16 ENCOUNTER — Other Ambulatory Visit: Payer: Self-pay | Admitting: *Deleted

## 2023-12-16 DIAGNOSIS — R053 Chronic cough: Secondary | ICD-10-CM

## 2023-12-19 MED ORDER — FAMOTIDINE 20 MG PO TABS: 20.0000 mg | ORAL_TABLET | Freq: Two times a day (BID) | ORAL | 1 refills | Status: AC

## 2023-12-27 ENCOUNTER — Telehealth: Payer: Self-pay | Admitting: Hematology and Oncology

## 2023-12-27 NOTE — Telephone Encounter (Signed)
 I left a voicemail for the patient regarding 04/02/24 appt being rescheduled to 04/03/24.

## 2023-12-29 ENCOUNTER — Inpatient Hospital Stay: Admission: RE | Admit: 2023-12-29 | Discharge: 2023-12-29

## 2023-12-29 DIAGNOSIS — Z1231 Encounter for screening mammogram for malignant neoplasm of breast: Secondary | ICD-10-CM

## 2024-04-02 ENCOUNTER — Other Ambulatory Visit

## 2024-04-02 ENCOUNTER — Ambulatory Visit: Admitting: Hematology and Oncology

## 2024-04-03 ENCOUNTER — Inpatient Hospital Stay: Admitting: Hematology and Oncology

## 2024-04-03 ENCOUNTER — Inpatient Hospital Stay
